# Patient Record
Sex: Male | Born: 1960 | Race: White | Hispanic: No | Marital: Married | Smoking: Never smoker
Health system: Southern US, Community
[De-identification: ages and names within clinical notes are randomized; demographics above are authoritative.]

## PROBLEM LIST (undated history)

## (undated) DIAGNOSIS — M109 Gout, unspecified: Secondary | ICD-10-CM

## (undated) DIAGNOSIS — C801 Malignant (primary) neoplasm, unspecified: Secondary | ICD-10-CM

## (undated) HISTORY — PX: BICEPS TENDON REPAIR: SHX566

## (undated) HISTORY — PX: PROSTATE SURGERY: SHX751

## (undated) HISTORY — DX: Malignant (primary) neoplasm, unspecified: C80.1

---

## 2005-04-04 ENCOUNTER — Ambulatory Visit (HOSPITAL_COMMUNITY): Admission: RE | Admit: 2005-04-04 | Discharge: 2005-04-04 | Payer: Self-pay | Admitting: Urology

## 2006-06-11 ENCOUNTER — Encounter (INDEPENDENT_AMBULATORY_CARE_PROVIDER_SITE_OTHER): Payer: Self-pay | Admitting: Urology

## 2006-06-11 ENCOUNTER — Inpatient Hospital Stay (HOSPITAL_COMMUNITY): Admission: RE | Admit: 2006-06-11 | Discharge: 2006-06-12 | Payer: Self-pay | Admitting: Urology

## 2007-05-26 DIAGNOSIS — Z8546 Personal history of malignant neoplasm of prostate: Secondary | ICD-10-CM | POA: Insufficient documentation

## 2008-08-02 ENCOUNTER — Ambulatory Visit: Payer: Self-pay | Admitting: Gastroenterology

## 2008-08-24 ENCOUNTER — Ambulatory Visit: Payer: Self-pay | Admitting: Gastroenterology

## 2008-08-25 ENCOUNTER — Telehealth (INDEPENDENT_AMBULATORY_CARE_PROVIDER_SITE_OTHER): Payer: Self-pay | Admitting: *Deleted

## 2009-02-20 ENCOUNTER — Telehealth: Payer: Self-pay | Admitting: Gastroenterology

## 2010-02-20 NOTE — Progress Notes (Signed)
Summary: letter request  Phone Note Call from Patient Call back at [phone]   Caller: wife, [name] Call For: Dr. Christella Hartigan Reason for Call: Talk to Nurse Summary of Call: asking for a typed letter from Dr. Christella Hartigan explaining medical necessity for pt's COL... COL has been denied by ins co twice per wife... please call wife with any questions, otherwise asked that she be called when letter is completed Initial call taken by: Vallarie Mare,  February 20, 2009 9:13 AM  Follow-up for Phone Call        Phone Call Completed Sent email to charge correction to add [phone]; Per scheduling notes, pt's PCP recommended colon due to heme+ stools. Will resubmit claim. Wife notified Follow-up by: Dwan Bolt,  February 20, 2009 9:43 AM

## 2010-06-05 NOTE — Op Note (Signed)
NAME:  Adam Cunningham, Adam Cunningham NO.:  [id number]   MEDICAL RECORD NO.:  [id number]          PATIENT TYPE:  INP   LOCATION:  NA                           FACILITY:  [facility]   PHYSICIAN:  Heloise Purpura, MD      DATE OF BIRTH:  1960-08-10   DATE OF PROCEDURE:  06/11/2006  DATE OF DISCHARGE:                               OPERATIVE REPORT   PREOPERATIVE DIAGNOSIS:  Clinically localized prostate cancer.   POSTOPERATIVE DIAGNOSIS:  Clinically localized prostate cancer.   PROCEDURES:  1. Robotic-assisted laparoscopic radical prostatectomy (bilateral      nerve-sparing).  2. Bilateral laparoscopic pelvic lymphadenectomy.   SURGEON:  Crecencio Mc, M.D.   ASSISTANT:  Excell Seltzer. Annabell Howells, M.D.   COMPLICATIONS:  None.   ANESTHESIA:  General.   ESTIMATED BLOOD LOSS:  300 mL.   INTRAVENOUS FLUIDS:  2 L of lactated Ringer's.   SPECIMENS:  1. Prostate and seminal vesicles.  2. Right pelvic lymph nodes.  3. Left pelvic lymph nodes.   DISPOSITION OF SPECIMEN:  To pathology.   DRAINS:  1. 20-French Coude catheter.  2. #19 Blake pelvic drain.   INDICATIONS:  Mr. Devera is a 50 year old gentleman with clinical  stage T1C prostate cancer with a PSA of 4.5 and Gleason score of 3+3+6.  After discussing management options for clinically localized prostate  cancer, he elected to proceed with the above procedure.  Pretreatment  IPSS was 0 with an IIEF score of 25 out of 25.  Potential risks and  benefits were discussed and informed consent was obtained.   DESCRIPTION OF PROCEDURE:  The patient was taken to the operating room  and a general anesthetic was administered.  He was given preoperative  antibiotics, placed in the dorsal lithotomy position, and prepped and  draped in usual sterile fashion.  Next a preoperative time-out was  performed.  A site was selected just to the left of the umbilicus for  placement of the camera port.  A Foley catheter was inserted into the  bladder.  The  camera port site was then incised and carried down through  the subcutaneous tissues with electrocautery.  The rectus fascia was  incised.  The underlying rectus muscle was spread laterally, thereby  exposing the posterior rectus sheath, which was then incised sharply  with a knife allowing entry into the peritoneal cavity under direct  vision.  A 12 mm port was then placed and a pneumoperitoneum was  established.  The 0 degrees lens was then used to inspect the abdomen,  and there was no evidence of any intra-abdominal injuries or other  abnormalities.  The remaining ports were then placed.  Bilateral 8 mm  robotic ports were placed approximately 10 cm lateral to the camera port  and just inferior to the camera port.  An additional 8 mm robotic port  was placed in the far left lateral abdominal wall.  A 5 mm port was  placed between the camera port and the right robotic port.  An  additional 12 mm port was placed in the far right lateral abdominal wall  for laparoscopic assistance.  All ports were placed under direct vision  without difficulty.  The surgical cart was then docked.  With the aid of  the cautery scissors, the bladder was reflected posteriorly allowing  entry into the space of Retzius and identification of the endopelvic  fascia and prostate.  The endopelvic fascia was incised from the apex  back to the base of the prostate bilaterally and the underlying levator  muscle fibers were swept laterally off the prostate.  This isolated the  dorsal venous complex, which was then stapled and divided with a 45 mm  flex ETS stapler.  The bladder neck was identified with the aid of Foley  catheter manipulation and was incised anteriorly, thereby exposing the  Foley catheter.  The catheter balloon was deflated and the catheter was  brought into the operative field and used to retract the prostate  anteriorly.  This exposed the posterior bladder neck, which was  similarly incised, and  dissection continued posteriorly between the  bladder neck and prostate until the vasa deferentia and seminal vesicles  were identified.  The vasa deferentia were isolated and divided and  lifted anteriorly.  The seminal vesicles were then dissected down to  their tips and care was taken to control the seminal vesicle arterial  blood supply.  The seminal vesicles were then also lifted anteriorly and  the space between Denonvilliers fascia and anterior rectum was bluntly  developed.  This isolated the vascular pedicles of the prostate.  The  lateral prostatic fascia was then incised allowing the neurovascular  bundles to be swept laterally and posteriorly off the prostate.  The  vascular pedicles of the prostate were then ligated above the level of  the neurovascular bundles with Hem-o-lok clips.  The vascular pedicles  were then divided with sharp cold scissors dissection.  The  neurovascular bundles were then swept off the apex of the prostate and  the urethra was sharply divided, allowing the prostate specimen to be  disarticulated.  The pelvis was then copiously irrigated and hemostasis  was ensured.  With irrigation in the pelvis, air was injected into the  rectal catheter and there was no evidence of a rectal injury.  Attention  then turned to the right pelvic sidewall.  The fibrofatty tissue between  the external iliac vein, confluence of the iliac vessels, hypogastric  artery, and Cooper's ligament was dissected free from the pelvic  sidewall with care to preserve the obturator nerve.  Hemoloc clips were  used for lymphostasis and hemostasis.  This specimen was then passed off  for permanent pathologic analysis.  An identical procedure was performed  on the contralateral side.  Attention then turned to the urethral  anastomosis.  A 2-0 Vicryl slip-knot was placed at the 6 o'clock  position between the bladder neck and urethra to reapproximate these structures.  A double-armed 3-0  Monocryl suture was then used to perform  a 360 degrees running tension-free anastomosis between the bladder neck  and urethra.  A new 20-French Coude catheter was inserted into the  bladder and irrigated.  There were no blood clots within the bladder and  anastomosis appeared to be watertight.  A #19 Blake drain was then  brought through the left robotic port and appropriately positioned in  the pelvis.  It was secured to the skin with a nylon suture.  A 0 Vicryl  suture was used to close the right lateral 12 mm port site with the  suture passer device.  The prostate specimen  was placed into the  Endopouch retrieval bag and removed intact within this bag via the  periumbilical port site.  All remaining ports were removed under direct  vision.  The periumbilical port site fascia was then closed with a  running 0 Vicryl suture.  All ports were then injected with  0.25% Marcaine and reapproximated at the skin level with staples.  Sterile dressings were applied.  The patient appeared to tolerate the  procedure well and without complications.  He was able to be extubated  and transferred to the recovery unit in satisfactory condition.           ______________________________  Heloise Purpura, MD  Electronically Signed     LB/MEDQ  D:  06/11/2006  T:  06/11/2006  Job:  [id number]

## 2010-06-05 NOTE — H&P (Signed)
NAME:  Adam Cunningham, Adam Cunningham NO.:  [id number]   MEDICAL RECORD NO.:  [id number]          PATIENT TYPE:  INP   LOCATION:  NA                           FACILITY:  [facility]   PHYSICIAN:  Heloise Purpura, MD      DATE OF BIRTH:  Jul 31, 1960   DATE OF ADMISSION:  DATE OF DISCHARGE:                              HISTORY & PHYSICAL   CHIEF COMPLAINT:  Prostate cancer.   HISTORY:  The patient is a 50 year old gentleman who has a history of  clinical stage T1 C prostate cancer with a PSA of 4.1 and Gleason score  of 3 +3 equals 6.  He was initially diagnosed in November2006.  Due to  the fact that he and his wife were trying to conceive a child, he  elected to delay his therapy.  He subsequently had a baby girl and  subsequently has decided to proceed with surgical therapy for his  prostate cancer.  His PSA was closely monitored and did fluctuate  between 3.9 and 4.5.  I counseled him regarding options including  restaging him with a repeat biopsy.  I also offered to the patient  simply proceed with a radical prostatectomy but did recommend a pelvic  lymphadenectomy to accurately stage his cancer.  He chose the latter  approach.   PAST MEDICAL HISTORY:  1. Gout.  2. History of hepatitis A.  3. Prostate cancer.   PAST SURGICAL HISTORY:  Biceps tendon repair.   MEDICATIONS:  None.   ALLERGIES:  NO KNOWN DRUG ALLERGIES.   FAMILY HISTORY:  The patient's father does have prostate cancer.  He  also has a maternal grandfather who died of prostate cancer.   SOCIAL HISTORY:  He works in Doctor, hospital.  He denies alcohol or  tobacco use.   REVIEW OF SYSTEMS:  All systems are reviewed and are negative except for  frequent headaches.   PHYSICAL EXAM:  CONSTITUTIONAL:  Well-nourished, well-developed age-  appropriate male in no acute distress.  CARDIOVASCULAR:  Regular rate and rhythm without obvious murmurs.  LUNGS:  Clear bilaterally.  ABDOMEN:  Soft and nontender.  RECTAL:   Digital rectal exam no nodularity or induration.   IMPRESSION:  Clinically localized prostate cancer.   PLAN:  The patient will undergo robotic assisted laparoscopic radical  prostatectomy and bilateral pelvic lymphadenectomy.  He will then be  admitted to the hospital for routine postoperative care.           ______________________________  Heloise Purpura, MD  Electronically Signed     LB/MEDQ  D:  06/11/2006  T:  06/11/2006  Job:  [id number]

## 2010-06-08 NOTE — Discharge Summary (Signed)
NAME:  Adam Cunningham, Adam Cunningham NO.:  [id number]   MEDICAL RECORD NO.:  [id number]          PATIENT TYPE:  INP   LOCATION:  1437                         FACILITY:  [facility]   PHYSICIAN:  Heloise Purpura, MD      DATE OF BIRTH:  08-21-1960   DATE OF ADMISSION:  06/11/2006  DATE OF DISCHARGE:  06/12/2006                               DISCHARGE SUMMARY   ADMISSION DIAGNOSIS:  Prostate cancer.   DISCHARGE DIAGNOSIS:  Prostate cancer.   HISTORY:  [name] is a 50 year old gentleman with clinically  localized prostate cancer.  For full details of his history and physical  please see his admission history and physical.   HOSPITAL COURSE:  On Jun 11, 2006, the patient was taken to the  operating room and underwent a robotic assisted laparoscopic radical  prostatectomy.  He tolerated this procedure well and without  complications.  Postoperatively, he was able to be transferred to a  regular hospital room following recovery from anesthesia.  He was able  to begin ambulating on the night of surgery.  By the morning of  postoperative day #1, he was able to begin a clear liquid diet which he  tolerated without difficulty and he maintained excellent urine output  with minimal output from his pelvic drain.  His pelvic drain was  therefore removed.  The patient had his diet advanced to liquids which  he tolerated and was therefore able to be transitioned to oral pain  medication.  By the afternoon of postoperative day #1, his hemoglobin  had remained stable and he was able to be discharged home in excellent  condition.   DISPOSITION:  Home.   DISCHARGE MEDICATIONS:  The patient was instructed to resume his regular  home medications excepting any aspirin, nonsteroidal anti-inflammatory  drugs, or herbal supplements.  He was given a prescription to take  Vicodin as needed for pain and told to use Colace as a stool softener.  He was also given a prescription to begin Cipro 1 day prior  to his  return visit for Foley catheter removal.   DISCHARGE INSTRUCTIONS:  The patient was instructed to be ambulatory but  specifically told to refrain from any heavy lifting, strenuous activity,  or driving.  He was instructed on routine Foley catheter care.  He was  also told to gradually advance his diet once passing flatus.   FOLLOW UP:  [name] will follow up in 1 week for removal of his  Foley catheter and to discuss his surgical pathology in detail.           ______________________________  Heloise Purpura, MD  Electronically Signed     LB/MEDQ  D:  06/13/2006  T:  06/13/2006  Job:  [id number]

## 2010-06-25 ENCOUNTER — Other Ambulatory Visit: Payer: Self-pay

## 2010-06-25 MED ORDER — WARFARIN SODIUM 5 MG PO TABS: 5.0000 mg | ORAL_TABLET | Freq: Every day | ORAL | Status: DC

## 2011-11-26 ENCOUNTER — Other Ambulatory Visit: Payer: Self-pay | Admitting: Family Medicine

## 2011-11-26 ENCOUNTER — Encounter: Payer: Self-pay | Admitting: Family Medicine

## 2011-11-26 DIAGNOSIS — R7989 Other specified abnormal findings of blood chemistry: Secondary | ICD-10-CM | POA: Insufficient documentation

## 2011-11-26 DIAGNOSIS — E663 Overweight: Secondary | ICD-10-CM

## 2011-11-26 DIAGNOSIS — E785 Hyperlipidemia, unspecified: Secondary | ICD-10-CM | POA: Insufficient documentation

## 2011-11-26 DIAGNOSIS — M109 Gout, unspecified: Secondary | ICD-10-CM | POA: Insufficient documentation

## 2011-11-26 DIAGNOSIS — R635 Abnormal weight gain: Secondary | ICD-10-CM

## 2011-11-26 DIAGNOSIS — Z8546 Personal history of malignant neoplasm of prostate: Secondary | ICD-10-CM

## 2011-11-28 ENCOUNTER — Other Ambulatory Visit (INDEPENDENT_AMBULATORY_CARE_PROVIDER_SITE_OTHER): Payer: Commercial Managed Care - PPO

## 2011-11-28 DIAGNOSIS — R7989 Other specified abnormal findings of blood chemistry: Secondary | ICD-10-CM

## 2011-11-28 DIAGNOSIS — Z8546 Personal history of malignant neoplasm of prostate: Secondary | ICD-10-CM

## 2011-11-28 DIAGNOSIS — R635 Abnormal weight gain: Secondary | ICD-10-CM

## 2011-11-28 DIAGNOSIS — E291 Testicular hypofunction: Secondary | ICD-10-CM

## 2011-11-28 DIAGNOSIS — E785 Hyperlipidemia, unspecified: Secondary | ICD-10-CM

## 2011-11-28 DIAGNOSIS — M109 Gout, unspecified: Secondary | ICD-10-CM

## 2011-11-28 DIAGNOSIS — E663 Overweight: Secondary | ICD-10-CM

## 2011-11-28 LAB — LIPID PANEL
LDL Cholesterol: 96 mg/dL (ref 0–99)
VLDL: 60 mg/dL — ABNORMAL HIGH (ref 0–40)

## 2011-11-28 LAB — CBC
HCT: 46.3 % (ref 39.0–52.0)
Hemoglobin: 16.6 g/dL (ref 13.0–17.0)
MCHC: 35.9 g/dL (ref 30.0–36.0)
MCV: 83.7 fL (ref 78.0–100.0)
RBC: 5.53 MIL/uL (ref 4.22–5.81)
RDW: 12.9 % (ref 11.5–15.5)
WBC: 11.7 10*3/uL — ABNORMAL HIGH (ref 4.0–10.5)

## 2011-11-28 LAB — COMPREHENSIVE METABOLIC PANEL
Alkaline Phosphatase: 57 U/L (ref 39–117)
CO2: 29 mEq/L (ref 19–32)
Creat: 1.07 mg/dL (ref 0.50–1.35)
Glucose, Bld: 79 mg/dL (ref 70–99)
Potassium: 4.2 mEq/L (ref 3.5–5.3)
Total Protein: 7.1 g/dL (ref 6.0–8.3)

## 2011-11-28 LAB — URIC ACID: Uric Acid, Serum: 9.6 mg/dL — ABNORMAL HIGH (ref 4.0–7.8)

## 2011-11-29 LAB — PSA: PSA: <0.01 ng/mL (ref <=4.00)

## 2011-12-09 ENCOUNTER — Ambulatory Visit (INDEPENDENT_AMBULATORY_CARE_PROVIDER_SITE_OTHER): Payer: Commercial Managed Care - PPO | Admitting: Family Medicine

## 2011-12-09 ENCOUNTER — Encounter: Payer: Self-pay | Admitting: Family Medicine

## 2011-12-09 VITALS — BP 130/82 | HR 74 | Temp 98.2°F | Resp 16 | Ht 71.0 in | Wt 253.6 lb

## 2011-12-09 DIAGNOSIS — M109 Gout, unspecified: Secondary | ICD-10-CM

## 2011-12-09 DIAGNOSIS — Z23 Encounter for immunization: Secondary | ICD-10-CM

## 2011-12-09 DIAGNOSIS — C61 Malignant neoplasm of prostate: Secondary | ICD-10-CM

## 2011-12-09 DIAGNOSIS — E236 Other disorders of pituitary gland: Secondary | ICD-10-CM

## 2011-12-09 DIAGNOSIS — E782 Mixed hyperlipidemia: Secondary | ICD-10-CM

## 2011-12-09 DIAGNOSIS — E785 Hyperlipidemia, unspecified: Secondary | ICD-10-CM

## 2011-12-09 DIAGNOSIS — Z0000 Encounter for general adult medical examination without abnormal findings: Secondary | ICD-10-CM

## 2011-12-09 DIAGNOSIS — Z8546 Personal history of malignant neoplasm of prostate: Secondary | ICD-10-CM

## 2011-12-09 LAB — POCT UA - MICROSCOPIC ONLY
Bacteria, U Microscopic: NEGATIVE
Crystals, Ur, HPF, POC: NEGATIVE
Yeast, UA: NEGATIVE

## 2011-12-09 LAB — POCT URINALYSIS DIPSTICK
Protein, UA: NEGATIVE
Urobilinogen, UA: 0.2
pH, UA: 5

## 2011-12-09 NOTE — Progress Notes (Signed)
Subjective:    Patient ID: Adam Cunningham, male    DOB: Jul 11, 1960, 51 y.o.   MRN: [id number]  HPI Adam Cunningham is a 51 y.o. male  Here for CPE. New patient to me.  See recent labs. Last Tdap 2012.   Colonoscopy few years ago - repeat in 10 years. - was normal. Flu shot today.  Hx of prostate CA with radical prostatectomy in 2009 - Dr. Laverle Patter.  Now followed by Dr. Ronal Fear. Last PSA less than 0.01. No urinary difficulties, no hematuria.   Hx Gout: uric acid 9.6 on labs recently.  last flair over 1 year ago. Has allopurinol rx, but not taken in over a year.  Has colcrys if needed.   Hyperlipidemia/Hypertriglyceridemia - cooks at home.  Uses butter often.  No regular exercise. Occasional olive oil use. May be losing a little weight.   Hypogonadism/low testosterone - not on replacement in past due to prostate history and children at [facility] had some skin irritation.    Results for orders placed in visit on 11/28/11  COMPREHENSIVE METABOLIC PANEL      Component Value Range   Sodium 140  135 - 145 mEq/L   Potassium 4.2  3.5 - 5.3 mEq/L   Chloride 101  96 - 112 mEq/L   CO2 29  19 - 32 mEq/L   Glucose, Bld 79  70 - 99 mg/dL   BUN 16  6 - 23 mg/dL   Creat 4.09  8.11 - 9.14 mg/dL   Total Bilirubin 1.1  0.3 - 1.2 mg/dL   Alkaline Phosphatase 57  39 - 117 U/L   AST 22  0 - 37 U/L   ALT 35  0 - 53 U/L   Total Protein 7.1  6.0 - 8.3 g/dL   Albumin 4.5  3.5 - 5.2 g/dL   Calcium 9.0  8.4 - 78.2 mg/dL  LIPID PANEL      Component Value Range   Cholesterol 192  0 - 200 mg/dL   Triglycerides 956 (*) <150 mg/dL   HDL 36 (*) >21 mg/dL   Total CHOL/HDL Ratio 5.3     VLDL 60 (*) 0 - 40 mg/dL   LDL Cholesterol 96  0 - 99 mg/dL  PSA      Component Value Range   PSA <0.01  <=4.00 ng/mL  CBC      Component Value Range   WBC 11.7 (*) 4.0 - 10.5 K/uL   RBC 5.53  4.22 - 5.81 MIL/uL   Hemoglobin 16.6  13.0 - 17.0 g/dL   HCT 30.8  65.7 - 84.6 %   MCV 83.7  78.0 - 100.0 fL   MCH  30.0  26.0 - 34.0 pg   MCHC 35.9  30.0 - 36.0 g/dL   RDW 96.2  95.2 - 84.1 %   Platelets 178  150 - 400 K/uL  TSH      Component Value Range   TSH 0.608  0.350 - 4.500 uIU/mL  TESTOSTERONE      Component Value Range   Testosterone 211.60 (*) 300 - 890 ng/dL  URIC ACID      Component Value Range   Uric Acid, Serum 9.6 (*) 4.0 - 7.8 mg/dL   Past Medical History  Diagnosis Date  . Cancer    Past Surgical History  Procedure Date  . Prostate surgery   . Biceps tendon repair     History   Social History  . Marital Status: Married  Spouse Name: N/A    Number of Children: N/A  . Years of Education: N/A   Occupational History  . Not on file.   Social History Main Topics  . Smoking status: Never Smoker   . Smokeless tobacco: Not on file  . Alcohol Use: No  . Drug Use: No  . Sexually Active: Not on file   Other Topics Concern  . Not on file   Social History Narrative  . No narrative on file      Review of Systems  All other systems reviewed and are negative.   See cma note - no positive responses    Objective:   Physical Exam  Constitutional: He is oriented to person, place, and time. He appears well-developed and well-nourished.  HENT:  Head: Normocephalic and atraumatic.  Right Ear: External ear normal.  Left Ear: External ear normal.  Mouth/Throat: Oropharynx is clear and moist.  Eyes: Conjunctivae normal and EOM are normal. Pupils are equal, round, and reactive to light.  Neck: Normal range of motion. Neck supple. Carotid bruit is not present. No thyromegaly present.  Cardiovascular: Normal rate, regular rhythm, normal heart sounds and intact distal pulses.   Pulmonary/Chest: Effort normal and breath sounds normal. No respiratory distress. He has no wheezes.  Abdominal: Soft. He exhibits no distension. There is no tenderness. Hernia confirmed negative in the right inguinal area and confirmed negative in the left inguinal area.  Genitourinary:       No  mass or palpable prostate.   Musculoskeletal: Normal range of motion. He exhibits no edema and no tenderness.  Lymphadenopathy:    He has no cervical adenopathy.  Neurological: He is alert and oriented to person, place, and time. He has normal reflexes.  Skin: Skin is warm and dry.  Psychiatric: He has a normal mood and affect. His behavior is normal.   Results for orders placed in visit on 12/09/11  IFOBT (OCCULT BLOOD)      Component Value Range   IFOBT Negative    POCT UA - MICROSCOPIC ONLY      Component Value Range   WBC, Ur, HPF, POC 0-1     RBC, urine, microscopic neg     Bacteria, U Microscopic neg     Mucus, UA small     Epithelial cells, urine per micros 0-2     Crystals, Ur, HPF, POC neg     Casts, Ur, LPF, POC neg     Yeast, UA neg    POCT URINALYSIS DIPSTICK      Component Value Range   Color, UA yellow     Clarity, UA clear     Glucose, UA neg     Bilirubin, UA neg     Ketones, UA neg     Spec Grav, UA 1.025     Blood, UA neg     pH, UA 5.0     Protein, UA neg     Urobilinogen, UA 0.2     Nitrite, UA neg     Leukocytes, UA Negative     BP 130/82  Pulse 74  Temp 98.2 F (36.8 C) (Oral)  Resp 16  Ht 5\' 11"  (1.803 m)  Wt 253 lb 9.6 oz (115.032 kg)  BMI 35.37 kg/m2  SpO2 98%     Assessment & Plan:  Adam Cunningham is a 51 y.o. male 1. Routine general medical examination at a health care facility  IFOBT POC (occult bld, rslt in office), POCT UA -  Microscopic Only, POCT urinalysis dipstick  2. Need for prophylactic vaccination and inoculation against influenza  Flu vaccine greater than or equal to 3yo preservative free IM  3. Hyperlipidemia    4. Gout    5. H/O prostate cancer     CPE - see prior labs.  Discussed lifestyle changes for hyperlipidemia. Flu vaccine given,  utd on td, hemosure negative, and up to date on colonoscopy. Anticipatory guidance as below.   Hyperlipidemia - plan working on diet and exercise changes, recheck lipids in 3-6  months. Consider fenofibrate if hypertriglyceridemia persists.  Gout - elevated uric acid, but no recent flair.  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  Watch diet, and if flair - has colcrys.  Hx of Prostate CA - u/a and recent PSA, exam reassuring.  Plan on yearly psa/DRE as recommended by urology.    Patient Instructions  Work on diet changes and increasing activity and exercise.  Recheck cholesterol in 3 to 6 months.  If triglycerides still elevated- may need to consider medication then. We can discuss the low testosterone at the next office visit.   Keeping you healthy  Get these tests  Blood pressure- Have your blood pressure checked once a year by your healthcare provider.  Normal blood pressure is 120/80  Weight- Have your body mass index (BMI) calculated to screen for obesity.  BMI is a measure of body fat based on height and weight. You can also calculate your own BMI at [email or web address].  Cholesterol- Have your cholesterol checked every year.  Diabetes- Have your blood sugar checked regularly if you have high blood pressure, high cholesterol, have a family history of diabetes or if you are overweight.  Screening for Colon Cancer- Colonoscopy starting at age 43.  Screening may begin sooner depending on your family history and other health conditions. Follow up colonoscopy as directed by your Gastroenterologist.  Screening for Prostate Cancer- Both blood work (PSA) and a rectal exam help screen for Prostate Cancer.  Screening begins at age 60 with African-American men and at age 45 with Caucasian men.  Screening may begin sooner depending on your family history.  Take these medicines  Aspirin- One aspirin daily can help prevent Heart disease and Stroke.  Flu shot- Every fall.  Tetanus- Every 10 years.  Zostavax- Once after the age of 73 to prevent Shingles.  Pneumonia  shot- Once after the age of 51; if you are younger than 39, ask your healthcare provider if you need a Pneumonia shot.  Take these steps  Don't smoke- If you do smoke, talk to your doctor about quitting.  For tips on how to quit, go to www.smokefree.gov or call 1-800-QUIT-NOW.  Be physically active- Exercise 5 days a week for at least 30 minutes.  If you are not already physically active start slow and gradually work up to 30 minutes of moderate physical activity.  Examples of moderate activity include walking briskly, mowing the yard, dancing, swimming, bicycling, etc.  Eat a healthy diet- Eat a variety of healthy food such as fruits, vegetables, low fat milk, low fat cheese, yogurt, lean meant, poultry, fish, beans, tofu, etc. For more information go to www.thenutritionsource.org  Drink alcohol in moderation- Limit alcohol intake to less than two drinks a day. Never drink and drive.  Dentist- Brush and floss twice daily; visit your dentist twice a year.  Depression- Your emotional health is as  important as your physical health. If you're feeling down, or losing interest in things you would normally enjoy please talk to your healthcare provider.  Eye exam- Visit your eye doctor every year.  Safe sex- If you may be exposed to a sexually transmitted infection, use a condom.  Seat belts- Seat belts can save your life; always wear one.  Smoke/Carbon Monoxide detectors- These detectors need to be installed on the appropriate level of your home.  Replace batteries at least once a year.  Skin cancer- When out in the sun, cover up and use sunscreen 15 SPF or higher.  Violence- If anyone is threatening you, please tell your healthcare provider.  Living Will/ Health care power of attorney- Speak with your healthcare provider and family.

## 2011-12-09 NOTE — Patient Instructions (Signed)
Work on diet changes and increasing activity and exercise.  Recheck cholesterol in 3 to 6 months.  If triglycerides still elevated- may need to consider medication then. We can discuss the low testosterone at the next office visit.   Keeping you healthy  Get these tests  Blood pressure- Have your blood pressure checked once a year by your healthcare provider.  Normal blood pressure is 120/80  Weight- Have your body mass index (BMI) calculated to screen for obesity.  BMI is a measure of body fat based on height and weight. You can also calculate your own BMI at [email or web address].  Cholesterol- Have your cholesterol checked every year.  Diabetes- Have your blood sugar checked regularly if you have high blood pressure, high cholesterol, have a family history of diabetes or if you are overweight.  Screening for Colon Cancer- Colonoscopy starting at age 58.  Screening may begin sooner depending on your family history and other health conditions. Follow up colonoscopy as directed by your Gastroenterologist.  Screening for Prostate Cancer- Both blood work (PSA) and a rectal exam help screen for Prostate Cancer.  Screening begins at age 76 with African-American men and at age 60 with Caucasian men.  Screening may begin sooner depending on your family history.  Take these medicines  Aspirin- One aspirin daily can help prevent Heart disease and Stroke.  Flu shot- Every fall.  Tetanus- Every 10 years.  Zostavax- Once after the age of 13 to prevent Shingles.  Pneumonia shot- Once after the age of 51; if you are younger than 73, ask your healthcare provider if you need a Pneumonia shot.  Take these steps  Don't smoke- If you do smoke, talk to your doctor about quitting.  For tips on how to quit, go to www.smokefree.gov or call 1-800-QUIT-NOW.  Be physically active- Exercise 5 days a week for at least 30 minutes.  If you are not already physically active start slow and gradually work up  to 30 minutes of moderate physical activity.  Examples of moderate activity include walking briskly, mowing the yard, dancing, swimming, bicycling, etc.  Eat a healthy diet- Eat a variety of healthy food such as fruits, vegetables, low fat milk, low fat cheese, yogurt, lean meant, poultry, fish, beans, tofu, etc. For more information go to www.thenutritionsource.org  Drink alcohol in moderation- Limit alcohol intake to less than two drinks a day. Never drink and drive.  Dentist- Brush and floss twice daily; visit your dentist twice a year.  Depression- Your emotional health is as important as your physical health. If you're feeling down, or losing interest in things you would normally enjoy please talk to your healthcare provider.  Eye exam- Visit your eye doctor every year.  Safe sex- If you may be exposed to a sexually transmitted infection, use a condom.  Seat belts- Seat belts can save your life; always wear one.  Smoke/Carbon Monoxide detectors- These detectors need to be installed on the appropriate level of your home.  Replace batteries at least once a year.  Skin cancer- When out in the sun, cover up and use sunscreen 15 SPF or higher.  Violence- If anyone is threatening you, please tell your healthcare provider.  Living Will/ Health care power of attorney- Speak with your healthcare provider and family.

## 2011-12-16 ENCOUNTER — Telehealth: Payer: Self-pay | Admitting: *Deleted

## 2011-12-16 MED ORDER — INDOMETHACIN 50 MG PO CAPS: 50.0000 mg | ORAL_CAPSULE | Freq: Two times a day (BID) | ORAL | Status: DC

## 2011-12-16 MED ORDER — COLCHICINE 0.6 MG PO TABS: 0.6000 mg | ORAL_TABLET | Freq: Every day | ORAL | Status: DC

## 2011-12-16 NOTE — Telephone Encounter (Signed)
Rx done. 

## 2011-12-16 NOTE — Telephone Encounter (Signed)
Needs a refill on colchicine and indocin. Pt is having a gout flare.

## 2012-12-08 ENCOUNTER — Encounter: Payer: Self-pay | Admitting: Emergency Medicine

## 2012-12-08 ENCOUNTER — Ambulatory Visit (INDEPENDENT_AMBULATORY_CARE_PROVIDER_SITE_OTHER): Payer: 59 | Admitting: Emergency Medicine

## 2012-12-08 VITALS — BP 120/82 | HR 72 | Temp 98.0°F | Resp 16 | Ht 71.0 in | Wt 229.8 lb

## 2012-12-08 DIAGNOSIS — Z8546 Personal history of malignant neoplasm of prostate: Secondary | ICD-10-CM

## 2012-12-08 DIAGNOSIS — Z23 Encounter for immunization: Secondary | ICD-10-CM

## 2012-12-08 DIAGNOSIS — Z0000 Encounter for general adult medical examination without abnormal findings: Secondary | ICD-10-CM

## 2012-12-08 DIAGNOSIS — M109 Gout, unspecified: Secondary | ICD-10-CM

## 2012-12-08 LAB — LIPID PANEL
Cholesterol: 197 mg/dL (ref 0–200)
HDL: 46 mg/dL (ref >39)
LDL Cholesterol: 115 mg/dL — ABNORMAL HIGH (ref 0–99)
Total CHOL/HDL Ratio: 4.3 Ratio
Triglycerides: 178 mg/dL — ABNORMAL HIGH (ref <150)

## 2012-12-08 LAB — CBC WITH DIFFERENTIAL/PLATELET
Basophils Absolute: 0 10*3/uL (ref 0.0–0.1)
Basophils Relative: 0 % (ref 0–1)
Eosinophils Absolute: 0.1 10*3/uL (ref 0.0–0.7)
Hemoglobin: 15.9 g/dL (ref 13.0–17.0)
Lymphs Abs: 2.4 10*3/uL (ref 0.7–4.0)
MCH: 29.8 pg (ref 26.0–34.0)
MCHC: 35.8 g/dL (ref 30.0–36.0)
MCV: 83.3 fL (ref 78.0–100.0)
Neutro Abs: 4.7 10*3/uL (ref 1.7–7.7)
Platelets: 232 10*3/uL (ref 150–400)
RBC: 5.33 MIL/uL (ref 4.22–5.81)
RDW: 13.6 % (ref 11.5–15.5)
WBC: 7.6 10*3/uL (ref 4.0–10.5)

## 2012-12-08 LAB — COMPREHENSIVE METABOLIC PANEL
BUN: 10 mg/dL (ref 6–23)
Chloride: 103 mEq/L (ref 96–112)
Creat: 0.89 mg/dL (ref 0.50–1.35)
Potassium: 4.1 mEq/L (ref 3.5–5.3)
Sodium: 141 mEq/L (ref 135–145)

## 2012-12-08 LAB — POCT URINALYSIS DIPSTICK
Glucose, UA: NEGATIVE
Leukocytes, UA: NEGATIVE
Nitrite, UA: NEGATIVE

## 2012-12-08 LAB — URIC ACID: Uric Acid, Serum: 8.4 mg/dL — ABNORMAL HIGH (ref 4.0–7.8)

## 2012-12-08 MED ORDER — ZOSTER VACCINE LIVE 19400 UNT/0.65ML ~~LOC~~ SOLR: 0.6500 mL | Freq: Once | SUBCUTANEOUS | Status: DC

## 2012-12-08 NOTE — Progress Notes (Signed)
@[email or web address] @  Patient ID: Adam Cunningham MRN: [id number], DOB: 01-Jan-1961 52 y.o. Date of Encounter: 12/08/2012, 4:01 PM  Primary Physician: No primary provider on file.  Chief Complaint: Physical (CPE)  HPI: 52 y.o. y/o male with history noted below here for CPE.  Doing well. No issues/complaints.  Review of Systems: Consitutional: No fever, chills, fatigue, night sweats, lymphadenopathy, or weight changes. He has been exercising regularly Eyes: No visual changes, eye redness, or discharge. ENT/Mouth: Ears: No otalgia, tinnitus, hearing loss, discharge. Nose: No congestion, rhinorrhea, sinus pain, or epistaxis. Throat: No sore throat, post nasal drip, or teeth pain. Cardiovascular: No CP, palpitations, diaphoresis, DOE, edema, orthopnea, PND. Respiratory: No cough, hemoptysis, SOB, or wheezing. Gastrointestinal: No anorexia, dysphagia, reflux, pain, nausea, vomiting, hematemesis, diarrhea, constipation, BRBPR, or melena. He is up-to-date on his colonoscopy his last being in 2000 and Genitourinary: No dysuria, frequency, urgency, hematuria, incontinence, nocturia, decreased urinary stream, discharge, impotence, or testicular pain/masses. He has a history of prostate cancer status post surgery intermittently has some discomfort in his left testicle Musculoskeletal: No decreased ROM, myalgias, stiffness, joint swelling, or weakness. He has a history of gout but no recent problems with joint discomfort. Is currently off his allopurinol to Skin: No rash, erythema, lesion changes, pain, warmth, jaundice, or pruritis. Neurological: No headache, dizziness, syncope, seizures, tremors, memory loss, coordination problems, or paresthesias. Psychological: No anxiety, depression, hallucinations, SI/HI. Endocrine: No fatigue, polydipsia, polyphagia, polyuria, or known diabetes. All other systems were reviewed and are otherwise negative.  Past Medical History  Diagnosis Date  . Cancer     prostate      Past Surgical History  Procedure Laterality Date  . Prostate surgery    . Biceps tendon repair      Home Meds:  Prior to Admission medications   Medication Sig Start Date End Date Taking? Authorizing Provider  colchicine 0.6 MG tablet Take 1 tablet (0.6 mg total) by mouth daily. 12/16/11  Yes Heather M Marte, PA-C  indomethacin (INDOCIN) 50 MG capsule Take 1 capsule (50 mg total) by mouth 2 (two) times daily with a meal. 12/16/11  Yes Nelva Nay, PA-C  zoster vaccine live, PF, (ZOSTAVAX) 29518 UNT/0.65ML injection Inject 19,400 Units into the skin once. 12/08/12   Collene Gobble, MD    Allergies: No Known Allergies  History   Social History  . Marital Status: Married    Spouse Name: N/A    Number of Children: N/A  . Years of Education: N/A   Occupational History  . Not on file.   Social History Main Topics  . Smoking status: Never Smoker   . Smokeless tobacco: Not on file  . Alcohol Use: No  . Drug Use: No  . Sexual Activity: Yes   Other Topics Concern  . 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 Cancer Mother     lung    Physical Exam:  Blood pressure 120/82, pulse 72, temperature 98 F (36.7 C), temperature source Oral, resp. rate 16, height 5\' 11"  (1.803 m), weight 229 lb 12.8 oz (104.237 kg), SpO2 98.00%.  General: Well developed, well nourished, in no acute distress. HEENT: Normocephalic, atraumatic. Conjunctiva pink, sclera non-icteric. Pupils 2 mm constricting to 1 mm, round, regular, and equally reactive to light and accomodation. EOMI. Internal auditory canal clear. TMs with good cone of light and without pathology. Nasal  mucosa pink. Nares are without discharge. No sinus tenderness. Oral mucosa pink. Dentition . Pharynx without exudate.   Neck: Supple. Trachea midline. No thyromegaly. Full ROM. No lymphadenopathy. Lungs: Clear to auscultation  bilaterally without wheezes, rales, or rhonchi. Breathing is of normal effort and unlabored. Cardiovascular: RRR with S1 S2. No murmurs, rubs, or gallops appreciated. Distal pulses 2+ symmetrically. No carotid or abdominal bruits.  Abdomen: Soft, non-tender, non-distended with normoactive bowel sounds. No hepatosplenomegaly or masses. No rebound/guarding. No CVA tenderness. Without hernias.  Rectal: No external hemorrhoids or fissures. Rectal vault without masses. The prostate gland is surgically absent Genitourinary:   circumcised male. No penile lesions. Testes descended bilaterally, and smooth without tenderness or masses.  Musculoskeletal: Full range of motion and 5/5 strength throughout. Without swelling, atrophy, tenderness, crepitus, or warmth. Extremities without clubbing, cyanosis, or edema. Calves supple. Skin: Warm and moist without erythema, ecchymosis, wounds, or rash. Neuro: A+Ox3. CN II-XII grossly intact. Moves all extremities spontaneously. Full sensation throughout. Normal gait. DTR 2+ throughout upper and lower extremities. Finger to nose intact. Psych:  Responds to questions appropriately with a normal affect.   Results for orders placed in visit on 12/08/12  POCT URINALYSIS DIPSTICK      Result Value Range   Color, UA yellow     Clarity, UA clear     Glucose, UA neg     Bilirubin, UA neg     Ketones, UA neg     Spec Grav, UA >=1.030     Blood, UA neg     pH, UA 6.5     Protein, UA neg     Urobilinogen, UA 0.2     Nitrite, UA neg     Leukocytes, UA Negative    IFOBT (OCCULT BLOOD)      Result Value Range   IFOBT Negative    EKG left axis deviation otherwise normal. Studies: CBC, CMET, Lipid, PSA,    Assessment/Plan:  52 y.o. y/o male here for his physical exam. He has a history of prostate cancer treated surgically. He has a history of hypertriglyceridemia as well as gout. He has been working on weight loss diet and exercise. He's been careful about his diet to  we'll see what his labs show. He had been on allopurinol ankle to scene and Indocin in the past for his gout but has had no recent flareups. He has a history of low testosterone but with his history of prostate cancer he is not a candidate for replacement anyway so I did not do any levels. -  Signed, Earl Lites, MD 12/08/2012 4:01 PM

## 2012-12-09 LAB — TSH: TSH: 1.238 u[IU]/mL (ref 0.350–4.500)

## 2012-12-09 LAB — PSA: PSA: <0.01 ng/mL (ref <=4.00)

## 2013-09-14 ENCOUNTER — Ambulatory Visit (INDEPENDENT_AMBULATORY_CARE_PROVIDER_SITE_OTHER): Payer: 59 | Admitting: Emergency Medicine

## 2013-09-14 ENCOUNTER — Encounter: Payer: Self-pay | Admitting: Emergency Medicine

## 2013-09-14 ENCOUNTER — Ambulatory Visit (INDEPENDENT_AMBULATORY_CARE_PROVIDER_SITE_OTHER): Payer: 59

## 2013-09-14 VITALS — BP 126/84 | HR 68 | Temp 97.8°F | Resp 16 | Ht 72.0 in | Wt 233.0 lb

## 2013-09-14 DIAGNOSIS — M899 Disorder of bone, unspecified: Secondary | ICD-10-CM

## 2013-09-14 DIAGNOSIS — M898X8 Other specified disorders of bone, other site: Secondary | ICD-10-CM

## 2013-09-14 DIAGNOSIS — M949 Disorder of cartilage, unspecified: Secondary | ICD-10-CM

## 2013-09-14 NOTE — Progress Notes (Signed)
   Subjective:  This chart was scribed for Adam Queen, MD by Adam Cunningham, Medical Scribe. This patient was seen in Room 21 and the patient's care was started at 9:29 AM.   Patient ID: Adam Cunningham, male    DOB: [birth year], 53 y.o.   MRN: [id number]  HPI HPI Comments: Adam Cunningham is a 53 y.o. male with a history of prostate cancer who presents to the [facility] complaining of a mass on his sternum he noticed a month ago.  He is not in pain.    Past Medical History  Diagnosis Date  . Cancer     prostate   Past Surgical History  Procedure Laterality Date  . Prostate surgery    . Biceps tendon repair     Family History  Problem Relation Age of Onset  . Cancer Mother     lung   History   Social History  . Marital Status: Married    Spouse Name: N/A    Number of Children: N/A  . Years of Education: N/A   Occupational History  . Not on file.   Social History Main Topics  . Smoking status: Never Smoker   . Smokeless tobacco: Not on file  . Alcohol Use: No  . Drug Use: No  . Sexual Activity: Yes   Other Topics Concern  . 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 Constitutional: He is oriented to person, place, and time. He appears well-developed and well-nourished.  HENT:  Head: Normocephalic and atraumatic.  Eyes: EOM are normal.  Neck: Normal range of motion.  Cardiovascular: Normal rate, regular rhythm and normal heart sounds.  Exam reveals no gallop and no friction rub.   No murmur heard. Pulmonary/Chest: Effort normal and breath sounds normal. No respiratory distress. He has no wheezes. He has no rales.  Very prominent xyphoid process which is non-tender.    Abdominal: Soft. Bowel sounds are normal. There is no tenderness.  Musculoskeletal: Normal range of motion.    Neurological: He is alert and oriented to person, place, and time.  Skin: Skin is warm and dry.  Psychiatric: He has a normal mood and affect. His behavior is normal.   [facility] reading (PRIMARY) by  Dr. Everlene Farrier patient has a prominent xiphoid process chest and sternal views otherwise normal    BP 126/84  Pulse 68  Temp(Src) 97.8 F (36.6 C)  Resp 16  Ht 6' (1.829 m)  Wt 233 lb (105.688 kg)  BMI 31.59 kg/m2 Assessment & Plan:  Patient reassured about the findings on his x-rays. Continue his regular scheduled physicals   I personally performed the services described in this documentation, which was scribed in my presence. The recorded information has been reviewed and is accurate.

## 2013-12-14 ENCOUNTER — Encounter: Payer: Self-pay | Admitting: Emergency Medicine

## 2013-12-14 ENCOUNTER — Ambulatory Visit (INDEPENDENT_AMBULATORY_CARE_PROVIDER_SITE_OTHER): Payer: 59 | Admitting: Emergency Medicine

## 2013-12-14 VITALS — BP 129/84 | HR 73 | Temp 97.0°F | Resp 16 | Ht 72.0 in | Wt 231.0 lb

## 2013-12-14 DIAGNOSIS — Z1211 Encounter for screening for malignant neoplasm of colon: Secondary | ICD-10-CM

## 2013-12-14 DIAGNOSIS — E785 Hyperlipidemia, unspecified: Secondary | ICD-10-CM

## 2013-12-14 DIAGNOSIS — Z0000 Encounter for general adult medical examination without abnormal findings: Secondary | ICD-10-CM

## 2013-12-14 DIAGNOSIS — Z8546 Personal history of malignant neoplasm of prostate: Secondary | ICD-10-CM

## 2013-12-14 DIAGNOSIS — H01003 Unspecified blepharitis right eye, unspecified eyelid: Secondary | ICD-10-CM

## 2013-12-14 DIAGNOSIS — H01006 Unspecified blepharitis left eye, unspecified eyelid: Secondary | ICD-10-CM

## 2013-12-14 DIAGNOSIS — M1A4720 Other secondary chronic gout, left ankle and foot, without tophus (tophi): Secondary | ICD-10-CM

## 2013-12-14 DIAGNOSIS — Z23 Encounter for immunization: Secondary | ICD-10-CM

## 2013-12-14 LAB — CBC WITH DIFFERENTIAL/PLATELET
BASOS ABS: 0.1 10*3/uL (ref 0.0–0.1)
BASOS PCT: 1 % (ref 0–1)
EOS PCT: 6 % — AB (ref 0–5)
Eosinophils Absolute: 0.4 10*3/uL (ref 0.0–0.7)
HEMATOCRIT: 45.3 % (ref 39.0–52.0)
HEMOGLOBIN: 16.1 g/dL (ref 13.0–17.0)
LYMPHS PCT: 39 % (ref 12–46)
Lymphs Abs: 2.3 10*3/uL (ref 0.7–4.0)
MCH: 29.5 pg (ref 26.0–34.0)
MCHC: 35.5 g/dL (ref 30.0–36.0)
MCV: 83.1 fL (ref 78.0–100.0)
MONO ABS: 0.3 10*3/uL (ref 0.1–1.0)
MONOS PCT: 5 % (ref 3–12)
MPV: 9 fL — AB (ref 9.4–12.4)
NEUTROS ABS: 2.9 10*3/uL (ref 1.7–7.7)
NEUTROS PCT: 49 % (ref 43–77)
PLATELETS: 170 10*3/uL (ref 150–400)
RBC: 5.45 MIL/uL (ref 4.22–5.81)
RDW: 13.4 % (ref 11.5–15.5)
WBC: 5.9 10*3/uL (ref 4.0–10.5)

## 2013-12-14 LAB — POCT URINALYSIS DIPSTICK
Bilirubin, UA: NEGATIVE
Glucose, UA: NEGATIVE
Ketones, UA: NEGATIVE
Leukocytes, UA: NEGATIVE
Nitrite, UA: NEGATIVE
PH UA: 6
Protein, UA: NEGATIVE
RBC UA: NEGATIVE
SPEC GRAV UA: 1.015
UROBILINOGEN UA: 0.2

## 2013-12-14 LAB — LIPID PANEL
CHOLESTEROL: 185 mg/dL (ref 0–200)
HDL: 39 mg/dL — ABNORMAL LOW (ref >39)
LDL Cholesterol: 104 mg/dL — ABNORMAL HIGH (ref 0–99)
TRIGLYCERIDES: 212 mg/dL — AB (ref <150)
Total CHOL/HDL Ratio: 4.7 Ratio
VLDL: 42 mg/dL — ABNORMAL HIGH (ref 0–40)

## 2013-12-14 LAB — COMPLETE METABOLIC PANEL WITH GFR
ALT: 17 U/L (ref 0–53)
AST: 16 U/L (ref 0–37)
Albumin: 4.5 g/dL (ref 3.5–5.2)
Alkaline Phosphatase: 56 U/L (ref 39–117)
BUN: 12 mg/dL (ref 6–23)
CALCIUM: 9.1 mg/dL (ref 8.4–10.5)
CHLORIDE: 105 meq/L (ref 96–112)
CO2: 24 meq/L (ref 19–32)
Creat: 0.95 mg/dL (ref 0.50–1.35)
GFR, Est Non African American: >89 mL/min
GLUCOSE: 99 mg/dL (ref 70–99)
Potassium: 3.9 mEq/L (ref 3.5–5.3)
Sodium: 140 mEq/L (ref 135–145)
Total Bilirubin: 0.7 mg/dL (ref 0.2–1.2)
Total Protein: 7.2 g/dL (ref 6.0–8.3)

## 2013-12-14 LAB — URIC ACID: URIC ACID, SERUM: 8.3 mg/dL — AB (ref 4.0–7.8)

## 2013-12-14 MED ORDER — ERYTHROMYCIN 5 MG/GM OP OINT: 1.0000 "application " | TOPICAL_OINTMENT | Freq: Every day | OPHTHALMIC | Status: DC

## 2013-12-14 NOTE — Progress Notes (Addendum)
Subjective:  This chart was scribed for [name], MD by Erling Conte, Medical Scribe. This patient was seen in Room 23 and the patient's care was started at 9:38 AM.   Patient ID: Adam Cunningham, male    DOB: Nov 30, 1960, 53 y.o.   MRN: [id number]  Chief Complaint  Patient presents with  . Annual Exam   HPI HPI Comments: Adam Cunningham is a 53 y.o. male who presents to the [facility] for his complete annual exam. Pt states he is doing well with no complaints. His tetanus is UTD. Pt previously used smokeless tobacco but has not used it in several years.  He is UTD on his colonoscopy. Pt would like to get a flu shot.  Patient Active Problem List   Diagnosis Date Noted  . Hyperlipidemia 11/26/2011  . Gout 11/26/2011  . Low testosterone 11/26/2011  . H/O prostate cancer 05/26/2007   Past Medical History  Diagnosis Date  . Cancer     prostate   Past Surgical History  Procedure Laterality Date  . Prostate surgery    . Biceps tendon repair     No Known Allergies Prior to Admission medications   Medication Sig Start Date End Date Taking? Authorizing Provider  colchicine 0.6 MG tablet Take 1 tablet (0.6 mg total) by mouth daily. 12/16/11   Heather Elnora Morrison, PA-C  indomethacin (INDOCIN) 50 MG capsule Take 1 capsule (50 mg total) by mouth 2 (two) times daily with a meal. 12/16/11   Collene Leyden, PA-C  zoster vaccine live, PF, (ZOSTAVAX) 91478 UNT/0.65ML injection Inject 19,400 Units into the skin once. 12/08/12   Darlyne Russian, MD   History   Social History  . Marital Status: Married    Spouse Name: N/A    Number of Children: N/A  . Years of Education: N/A   Occupational History  . Not on file.   Social History Main Topics  . Smoking status: Never Smoker   . Smokeless tobacco: Not on file  . Alcohol Use: No  . Drug Use: No  . Sexual Activity: Yes   Other Topics Concern  . 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  Respiratory: Negative for cough and shortness of breath.   Cardiovascular: Negative for chest pain.  Gastrointestinal: Negative for nausea, vomiting, abdominal pain and constipation.  Genitourinary: Negative for dysuria.  Musculoskeletal: Negative for myalgias, back pain and arthralgias.  Skin: Negative for rash.  Neurological: Negative for dizziness, weakness, numbness and headaches.       Objective:   Physical Exam CONSTITUTIONAL: Well developed/well nourished HEAD: Normocephalic/atraumatic EYES: EOMI/PERRL. Redness and scaling of upper and lower lids bilaterally.  ENMT: Mucous membranes moist NECK: supple no meningeal signs SPINE/BACK:entire spine nontender CV: S1/S2 noted, no murmurs/rubs/gallops noted LUNGS: Lungs are clear to auscultation bilaterally, no apparent distress ABDOMEN: soft, nontender, no rebound or guarding, bowel sounds noted throughout abdomen GU:no cva tenderness NEURO: Pt is awake/alert/appropriate, moves all extremitiesx4.  No facial droop.   EXTREMITIES: pulses normal/equal, full ROM SKIN: warm, color normal PSYCH: no abnormalities of mood noted, alert and oriented to situation Genital exam is normal with a surgically absent prostate.      Assessment & Plan:  Pt doing well. Flu shot given today. UTD on colonoscopy. Marland Kitchen He was given erythromycin  for his blepharitis. He has been released by his urologist and will have his yearly checks through our office. I personally performed the services described in this documentation, which was scribed in my presence. The recorded information has been reviewed and is accurate.

## 2013-12-14 NOTE — Progress Notes (Deleted)
   Subjective:    Patient ID: Adam Cunningham, male    DOB: 08/22/1960, 53 y.o.   MRN: [id number]  HPI    Review of Systems     Objective:   Physical Exam        Assessment & Plan:   

## 2013-12-15 LAB — PSA

## 2013-12-15 MED ORDER — ALLOPURINOL 100 MG PO TABS: 100.0000 mg | ORAL_TABLET | Freq: Every day | ORAL | Status: DC

## 2013-12-15 NOTE — Addendum Note (Signed)
Addended by: Constance Goltz on: 12/15/2013 02:25 PM   Modules accepted: Orders, SmartSet

## 2013-12-27 ENCOUNTER — Other Ambulatory Visit: Payer: Self-pay | Admitting: Emergency Medicine

## 2013-12-27 ENCOUNTER — Telehealth: Payer: Self-pay | Admitting: *Deleted

## 2013-12-27 MED ORDER — INDOMETHACIN 50 MG PO CAPS: 50.0000 mg | ORAL_CAPSULE | Freq: Two times a day (BID) | ORAL | Status: DC

## 2013-12-27 MED ORDER — COLCHICINE 0.6 MG PO TABS: 0.6000 mg | ORAL_TABLET | Freq: Every day | ORAL | Status: DC

## 2013-12-27 MED ORDER — HYDROCODONE-ACETAMINOPHEN 5-325 MG PO TABS: 1.0000 | ORAL_TABLET | Freq: Four times a day (QID) | ORAL | Status: DC | PRN

## 2013-12-27 NOTE — Telephone Encounter (Signed)
Wife states that pt is having a bad gout flare up. He is asking for a refill on his hydrocodone.  I have refilled the colcrys and indocin

## 2013-12-27 NOTE — Telephone Encounter (Signed)
Pt given script

## 2013-12-27 NOTE — Telephone Encounter (Signed)
Prescription printed

## 2013-12-28 LAB — IFOBT (OCCULT BLOOD): IFOBT: NEGATIVE

## 2014-04-29 ENCOUNTER — Encounter: Payer: Self-pay | Admitting: Emergency Medicine

## 2014-04-29 ENCOUNTER — Ambulatory Visit (INDEPENDENT_AMBULATORY_CARE_PROVIDER_SITE_OTHER): Payer: 59

## 2014-04-29 ENCOUNTER — Ambulatory Visit (INDEPENDENT_AMBULATORY_CARE_PROVIDER_SITE_OTHER): Payer: 59 | Admitting: Emergency Medicine

## 2014-04-29 VITALS — BP 132/78 | HR 78 | Temp 98.1°F | Resp 16 | Ht 73.0 in | Wt 244.0 lb

## 2014-04-29 DIAGNOSIS — R05 Cough: Secondary | ICD-10-CM | POA: Diagnosis not present

## 2014-04-29 DIAGNOSIS — R059 Cough, unspecified: Secondary | ICD-10-CM

## 2014-04-29 DIAGNOSIS — J029 Acute pharyngitis, unspecified: Secondary | ICD-10-CM | POA: Diagnosis not present

## 2014-04-29 LAB — POCT RAPID STREP A (OFFICE): Rapid Strep A Screen: NEGATIVE

## 2014-04-29 MED ORDER — FIRST-DUKES MOUTHWASH MT SUSP: OROMUCOSAL | Status: DC

## 2014-04-29 MED ORDER — MOMETASONE FUROATE 50 MCG/ACT NA SUSP: 2.0000 | Freq: Every day | NASAL | Status: DC

## 2014-04-29 MED ORDER — BENZONATATE 100 MG PO CAPS: 100.0000 mg | ORAL_CAPSULE | Freq: Three times a day (TID) | ORAL | Status: DC | PRN

## 2014-04-29 MED ORDER — AZITHROMYCIN 250 MG PO TABS: ORAL_TABLET | ORAL | Status: DC

## 2014-04-29 NOTE — Progress Notes (Signed)
   Subjective:    Patient ID: ILIJA MAXIM, male    DOB: Oct 02, 1960, 54 y.o.   MRN: [id number]  HPI patient states for the last week he has been bothered with severe sore throat. He also initially had flulike symptoms with aching and cough. He continues to have a severe sore throat. He has not had any further fever. 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    Review of Systems     Objective:   Physical Exam  Constitutional: He appears well-developed and well-nourished.  Eyes: Pupils are equal, round, and reactive to light.  Neck: No tracheal deviation present. No thyromegaly present.  Cardiovascular: Normal rate, regular rhythm and normal heart sounds.    [facility] reading (PRIMARY) by  Dr.Jersi Mcmaster please comment there is prominence right him for hilar area and some scarring left base. Results for orders placed or performed in visit on 04/29/14  POCT rapid strep A  Result Value Ref Range   Rapid Strep A Screen Negative Negative         Assessment & Plan:  Patient placed on tessalon pearls, Z-pack and [employer]'s mouthwash. Awaiting reading from radiologist. He has had occupational exposure to toxin being a Airline pilot. May proceed with CT if radiologist sees any suspicious areas.

## 2014-04-29 NOTE — Progress Notes (Deleted)
   Subjective:    Patient ID: Adam Cunningham, male    DOB: 04/22/1960, 54 y.o.   MRN: [id number]  HPI    Review of Systems     Objective:   Physical Exam        Assessment & Plan:

## 2014-04-29 NOTE — Patient Instructions (Signed)
Acute Bronchitis Bronchitis is inflammation of the airways that extend from the windpipe into the lungs (bronchi). The inflammation often causes mucus to develop. This leads to a cough, which is the most common symptom of bronchitis.  In acute bronchitis, the condition usually develops suddenly and goes away over time, usually in a couple weeks. Smoking, allergies, and asthma can make bronchitis worse. Repeated episodes of bronchitis may cause further lung problems.  CAUSES Acute bronchitis is most often caused by the same virus that causes a cold. The virus can spread from person to person (contagious) through coughing, sneezing, and touching contaminated objects. SIGNS AND SYMPTOMS   Cough.   Fever.   Coughing up mucus.   Body aches.   Chest congestion.   Chills.   Shortness of breath.   Sore throat.  DIAGNOSIS  Acute bronchitis is usually diagnosed through a physical exam. Your health care provider will also ask you questions about your medical history. Tests, such as chest X-rays, are sometimes done to rule out other conditions.  TREATMENT  Acute bronchitis usually goes away in a couple weeks. Oftentimes, no medical treatment is necessary. Medicines are sometimes given for relief of fever or cough. Antibiotic medicines are usually not needed but may be prescribed in certain situations. In some cases, an inhaler may be recommended to help reduce shortness of breath and control the cough. A cool mist vaporizer may also be used to help thin bronchial secretions and make it easier to clear the chest.  HOME CARE INSTRUCTIONS  Get plenty of rest.   Drink enough fluids to keep your urine clear or pale yellow (unless you have a medical condition that requires fluid restriction). Increasing fluids may help thin your respiratory secretions (sputum) and reduce chest congestion, and it will prevent dehydration.   Take medicines only as directed by your health care provider.  If  you were prescribed an antibiotic medicine, finish it all even if you start to feel better.  Avoid smoking and secondhand smoke. Exposure to cigarette smoke or irritating chemicals will make bronchitis worse. If you are a smoker, consider using nicotine gum or skin patches to help control withdrawal symptoms. Quitting smoking will help your lungs heal faster.   Reduce the chances of another bout of acute bronchitis by washing your hands frequently, avoiding people with cold symptoms, and trying not to touch your hands to your mouth, nose, or eyes.   Keep all follow-up visits as directed by your health care provider.  SEEK MEDICAL CARE IF: Your symptoms do not improve after 1 week of treatment.  SEEK IMMEDIATE MEDICAL CARE IF:  You develop an increased fever or chills.   You have chest pain.   You have severe shortness of breath.  You have bloody sputum.   You develop dehydration.  You faint or repeatedly feel like you are going to pass out.  You develop repeated vomiting.  You develop a severe headache. MAKE SURE YOU:   Understand these instructions.  Will watch your condition.  Will get help right away if you are not doing well or get worse. Document Released: 02/15/2004 Document Revised: 05/24/2013 Document Reviewed: 06/30/2012 [facility] Patient Information 2015 [place], [place]. This information is not intended to replace advice given to you by your health care provider. Make sure you discuss any questions you have with your health care provider. Pharyngitis Pharyngitis is redness, pain, and swelling (inflammation) of your pharynx.  CAUSES  Pharyngitis is usually caused by infection. Most of the time,  these infections are from viruses (viral) and are part of a cold. However, sometimes pharyngitis is caused by bacteria (bacterial). Pharyngitis can also be caused by allergies. Viral pharyngitis may be spread from person to person by coughing, sneezing, and personal items or  utensils (cups, forks, spoons, toothbrushes). Bacterial pharyngitis may be spread from person to person by more intimate contact, such as kissing.  SIGNS AND SYMPTOMS  Symptoms of pharyngitis include:   Sore throat.   Tiredness (fatigue).   Low-grade fever.   Headache.  Joint pain and muscle aches.  Skin rashes.  Swollen lymph nodes.  Plaque-like film on throat or tonsils (often seen with bacterial pharyngitis). DIAGNOSIS  Your health care provider will ask you questions about your illness and your symptoms. Your medical history, along with a physical exam, is often all that is needed to diagnose pharyngitis. Sometimes, a rapid strep test is done. Other lab tests may also be done, depending on the suspected cause.  TREATMENT  Viral pharyngitis will usually get better in 3-4 days without the use of medicine. Bacterial pharyngitis is treated with medicines that kill germs (antibiotics).  HOME CARE INSTRUCTIONS   Drink enough water and fluids to keep your urine clear or pale yellow.   Only take over-the-counter or prescription medicines as directed by your health care provider:   If you are prescribed antibiotics, make sure you finish them even if you start to feel better.   Do not take aspirin.   Get lots of rest.   Gargle with 8 oz of salt water ( tsp of salt per 1 qt of water) as often as every 1-2 hours to soothe your throat.   Throat lozenges (if you are not at risk for choking) or sprays may be used to soothe your throat. SEEK MEDICAL CARE IF:   You have large, tender lumps in your neck.  You have a rash.  You cough up green, yellow-brown, or bloody spit. SEEK IMMEDIATE MEDICAL CARE IF:   Your neck becomes stiff.  You drool or are unable to swallow liquids.  You vomit or are unable to keep medicines or liquids down.  You have severe pain that does not go away with the use of recommended medicines.  You have trouble breathing (not caused by a stuffy  nose). MAKE SURE YOU:   Understand these instructions.  Will watch your condition.  Will get help right away if you are not doing well or get worse. Document Released: 01/07/2005 Document Revised: 10/28/2012 Document Reviewed: 09/14/2012 [facility] Patient Information 2015 [place], [place]. This information is not intended to replace advice given to you by your health care provider. Make sure you discuss any questions you have with your health care provider.

## 2014-05-01 LAB — CULTURE, GROUP A STREP: Organism ID, Bacteria: NORMAL

## 2014-12-04 ENCOUNTER — Ambulatory Visit: Payer: 59

## 2014-12-07 ENCOUNTER — Encounter: Payer: 59 | Admitting: Urgent Care

## 2014-12-26 ENCOUNTER — Ambulatory Visit (INDEPENDENT_AMBULATORY_CARE_PROVIDER_SITE_OTHER): Payer: 59 | Admitting: Family Medicine

## 2014-12-26 ENCOUNTER — Encounter: Payer: Self-pay | Admitting: Family Medicine

## 2014-12-26 VITALS — BP 116/76 | HR 76 | Temp 98.5°F | Resp 16 | Ht 71.0 in | Wt 235.0 lb

## 2014-12-26 DIAGNOSIS — M545 Low back pain, unspecified: Secondary | ICD-10-CM

## 2014-12-26 DIAGNOSIS — M1009 Idiopathic gout, multiple sites: Secondary | ICD-10-CM | POA: Diagnosis not present

## 2014-12-26 DIAGNOSIS — E785 Hyperlipidemia, unspecified: Secondary | ICD-10-CM | POA: Diagnosis not present

## 2014-12-26 DIAGNOSIS — Z0000 Encounter for general adult medical examination without abnormal findings: Secondary | ICD-10-CM

## 2014-12-26 DIAGNOSIS — L301 Dyshidrosis [pompholyx]: Secondary | ICD-10-CM | POA: Diagnosis not present

## 2014-12-26 DIAGNOSIS — M109 Gout, unspecified: Secondary | ICD-10-CM

## 2014-12-26 DIAGNOSIS — Z130 Encounter for screening for diseases of the blood and blood-forming organs and certain disorders involving the immune mechanism: Secondary | ICD-10-CM

## 2014-12-26 DIAGNOSIS — Z8546 Personal history of malignant neoplasm of prostate: Secondary | ICD-10-CM | POA: Diagnosis not present

## 2014-12-26 LAB — CBC
HEMATOCRIT: 46.1 % (ref 39.0–52.0)
Hemoglobin: 16 g/dL (ref 13.0–17.0)
MCH: 29.7 pg (ref 26.0–34.0)
MCHC: 34.7 g/dL (ref 30.0–36.0)
MCV: 85.7 fL (ref 78.0–100.0)
MPV: 9.7 fL (ref 8.6–12.4)
PLATELETS: 185 10*3/uL (ref 150–400)
RBC: 5.38 MIL/uL (ref 4.22–5.81)
RDW: 13.5 % (ref 11.5–15.5)
WBC: 6.5 10*3/uL (ref 4.0–10.5)

## 2014-12-26 LAB — LIPID PANEL
CHOL/HDL RATIO: 4.4 ratio (ref <=5.0)
CHOLESTEROL: 177 mg/dL (ref 125–200)
HDL: 40 mg/dL (ref >=40)
LDL Cholesterol: 104 mg/dL (ref <130)
Triglycerides: 167 mg/dL — ABNORMAL HIGH (ref <150)
VLDL: 33 mg/dL — ABNORMAL HIGH (ref <30)

## 2014-12-26 LAB — COMPLETE METABOLIC PANEL WITH GFR
ALK PHOS: 48 U/L (ref 40–115)
ALT: 17 U/L (ref 9–46)
AST: 17 U/L (ref 10–35)
Albumin: 4.7 g/dL (ref 3.6–5.1)
BUN: 13 mg/dL (ref 7–25)
CALCIUM: 8.8 mg/dL (ref 8.6–10.3)
CHLORIDE: 104 mmol/L (ref 98–110)
CO2: 25 mmol/L (ref 20–31)
Creat: 0.92 mg/dL (ref 0.70–1.33)
GFR, Est African American: >89 mL/min (ref >=60)
GFR, Est Non African American: >89 mL/min (ref >=60)
Glucose, Bld: 84 mg/dL (ref 65–99)
POTASSIUM: 3.8 mmol/L (ref 3.5–5.3)
SODIUM: 140 mmol/L (ref 135–146)
Total Bilirubin: 0.9 mg/dL (ref 0.2–1.2)
Total Protein: 7.1 g/dL (ref 6.1–8.1)

## 2014-12-26 LAB — URIC ACID: Uric Acid, Serum: 7.7 mg/dL (ref 4.0–7.8)

## 2014-12-26 MED ORDER — HYDROCODONE-ACETAMINOPHEN 5-325 MG PO TABS: 1.0000 | ORAL_TABLET | Freq: Four times a day (QID) | ORAL | Status: DC | PRN

## 2014-12-26 MED ORDER — CYCLOBENZAPRINE HCL 5 MG PO TABS: ORAL_TABLET | ORAL | Status: DC

## 2014-12-26 MED ORDER — ALLOPURINOL 100 MG PO TABS: 100.0000 mg | ORAL_TABLET | Freq: Two times a day (BID) | ORAL | Status: DC

## 2014-12-26 NOTE — Progress Notes (Signed)
Subjective:    Patient ID: Adam Cunningham, male    DOB: [birth year], 54 y.o.   MRN: [id number] By signing my name below, I, Zola Button, attest that this documentation has been prepared under the direction and in the presence of Merri Ray, MD.  Electronically Signed: Zola Button, Medical Scribe. 12/26/2014. 11:45 AM.  HPI HPI Comments: Adam Cunningham is a 54 y.o. male who presents to the [facility] for an annual exam. His last physical was with Dr. Everlene Farrier in 2014. But he had seen me for his physical in 2013. He has a history of prostate cancer with radical prostatectomy in 2009 and hyperlipidemia. Also history of gout and low testosterone. He is fasting today. No specific concerns today. 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 He denies smoking history, but he had been using dip tobacco in the past.  Cancer screening:  Colon cancer screening - Colonoscopy in August 2010, mild diverticulosis, otherwise normal. Repeat in 10 years. Done by Dr. Ardis Hughs. Skin cancer screening - No personal history of skin cancer. No FMHx of skin cancers. No recent skin findings. ABCD screening techniques discussed. Prostate cancer screening - He has a history of prostate cancer with radical prostatectomy in 2009 by Dr. Alinda Money. Most recently followed by Dr. Karsten Ro. Had been released by his urologist to have yearly checks at our office. No difficulty with urination or incontinence. He had been having DREs with Dr. Everlene Farrier and his urologist, but he is unsure why.  Lab Results  Component Value Date   PSA <0.01 12/14/2013   PSA <0.01 12/08/2012   PSA <0.01 11/28/2011    Immunizations:  Immunization History  Administered Date(s) Administered  . Influenza Split 12/09/2011  . Influenza,inj,Quad PF,36+ Mos 12/08/2012, 12/14/2013  . Influenza-Unspecified 11/22/2014  . Tdap 10/25/2010    Depression screening:  Depression screen [facility] 2/9 12/26/2014 12/14/2013 09/14/2013  Decreased  Interest 0 0 0  Down, Depressed, Hopeless 0 0 0  PHQ - 2 Score 0 0 0   Fall screening: 0 in the past year.  Optho: He last saw his eye doctor about 2 months ago.  Visual Acuity Screening   Right eye Left eye Both eyes  Without correction:     With correction: 20/20 20/20 20/20     Dentist: He had routine cleaning 2 weeks ago.  Exercise: Patient notes he gets exercise doing work around the house, at least 150 minutes a week. Body mass index is 32.79 kg/(m^2).   Hyperlipidemia: He is not on any medication for this. Recommended weight loss. Lab Results  Component Value Date   CHOL 185 12/14/2013   HDL 39* 12/14/2013   LDLCALC 104* 12/14/2013   TRIG 212* 12/14/2013   CHOLHDL 4.7 12/14/2013    Wt Readings from Last 3 Encounters:  12/26/14 235 lb (106.595 kg)  04/29/14 244 lb (110.678 kg)  12/14/13 231 lb (104.781 kg)    Gout: His uric acid was 8.3 on November 2015. He has colchicine if needed. Patient is currently on allopurinol to 100 mg qd. He has not tried increasing his allopurinol to twice a day. His last gout flare was a few months ago, and he has about 2-3 flares a year. He takes colchicine when he has the flares. Patient takes hydrocodone when his flares are more severe, but notes he has run out.  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  He has had skelaxin and flexeril in the past for back pain and would like some for his current pain. He does not remember what dose he had been on. Patient denies lower extremity weakness, incontinence, and saddle anesthesia.  Patient Active Problem List   Diagnosis Date Noted  . Hyperlipidemia 11/26/2011  . Gout 11/26/2011  . Low testosterone 11/26/2011  . H/O prostate cancer 05/26/2007   Past Medical History  Diagnosis Date  . Cancer [facility])     prostate   Past Surgical History  Procedure Laterality Date  . Prostate surgery    . Biceps tendon  repair     No Known Allergies Prior to Admission medications   Medication Sig Start Date End Date Taking? Authorizing Provider  allopurinol (ZYLOPRIM) 100 MG tablet Take 1 tablet (100 mg total) by mouth daily. 12/15/13  Yes Darlyne Russian, MD  colchicine 0.6 MG tablet Take 1 tablet (0.6 mg total) by mouth daily. 12/27/13  Yes Darlyne Russian, MD  erythromycin [facility]) ophthalmic ointment Place 1 application into both eyes at bedtime. 12/14/13  Yes Darlyne Russian, MD  HYDROcodone-acetaminophen (NORCO) 5-325 MG per tablet Take 1 tablet by mouth every 6 (six) hours as needed for moderate pain. 12/27/13  Yes Darlyne Russian, MD  indomethacin (INDOCIN) 50 MG capsule Take 1 capsule (50 mg total) by mouth 2 (two) times daily with a meal. 12/27/13  Yes Darlyne Russian, MD  mometasone (NASONEX) 50 MCG/ACT nasal spray Place 2 sprays into the nose daily. 04/29/14   Darlyne Russian, MD  zoster vaccine live, PF, (ZOSTAVAX) 96295 UNT/0.65ML injection Inject 19,400 Units into the skin once. Patient not taking: Reported on 12/14/2013 12/08/12   Darlyne Russian, MD   Social History   Social History  . Marital Status: Married    Spouse Name: N/A  . Number of Children: N/A  . Years of Education: N/A   Occupational History  . 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 Smoking status: Never Smoker   . Smokeless tobacco: Not on file  . Alcohol Use: No  . Drug Use: No  . Sexual Activity: Yes   Other Topics Concern  . 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   Social research officer, government   Education: [employer]   Exercise: Yes     Review of Systems 13 point ROS reviewed on patient health survey. Negative other than listed above or in nursing note. See nursing note.     Objective:   Physical Exam  Constitutional: He is oriented to person, place, and time. He appears well-developed and well-nourished.  HENT:  Head: Normocephalic and atraumatic.  Right Ear: External ear  normal.  Left Ear: External ear normal.  Mouth/Throat: Oropharynx is clear and moist.  Eyes: Conjunctivae and EOM are normal. Pupils are equal, round, and reactive to light.  Neck: Normal range of motion. Neck supple. No thyromegaly present.  Cardiovascular: Normal rate, regular rhythm, normal heart sounds and intact distal pulses.   Pulmonary/Chest: Effort normal and breath sounds normal. No respiratory distress. He has no wheezes.  Abdominal: Soft. He exhibits no distension. There is no tenderness. Hernia confirmed negative in the right inguinal area and confirmed negative in the left inguinal area.  Genitourinary:  DRE deferred with prior prostatectomy.   Musculoskeletal: Normal range of motion. He exhibits tenderness. He exhibits no edema.  Minimal tenderness over the left lower  paraspinals, just above the iliac crest. SI joint non-tender. Lumbar ROM flexion to 90 degrees, otherwise normal. Negative seated straight leg raise.  Lymphadenopathy:    He has no cervical adenopathy.  Neurological: He is alert and oriented to person, place, and time. He has normal reflexes.  Skin: Skin is warm and dry.  Slight peeling and scaling of right palmar hand, small area on right foot.  Psychiatric: He has a normal mood and affect. His behavior is normal.  Vitals reviewed.     Filed Vitals:   12/26/14 1045  BP: 116/76  Pulse: 76  Temp: 98.5 F (36.9 C)  TempSrc: Oral  Resp: 16  Height: 5\' 11"  (1.803 m)  Weight: 235 lb (106.595 kg)  SpO2: 96%       Assessment & Plan:   Adam Cunningham is a 53 y.o. male Annual physical exam  -anticipatory guidance as below in AVS, screening labs above. Health maintenance items as above in HPI discussed/recommended as applicable.   Left-sided low back pain without sciatica - Plan: cyclobenzaprine (FLEXERIL) 5 MG tablet  -strain/overuse likely.  Trial of flexeril, sx care and HEP by handout.   -rtc precautions if persistent.   Gouty arthritis - Plan:  Uric Acid, HYDROcodone-acetaminophen (NORCO) 5-325 MG tablet, allopurinol (ZYLOPRIM) 100 MG tablet  - uric acid pending. Based on flair frequency - will increase allopurinol to 100mg  BID as planned at prior OV.   -Continue colchicine if needed at acute flair and refilled Hydrocodone if needed for acute pain.   Hyperlipidemia - Plan: COMPLETE METABOLIC PANEL WITH GFR, Lipid panel  -labs pending. Encouraged increase exercise with ultimate of goal of weight loss to get BMI closer to or under 30.   History of prostate cancer - Plan: PSA  -PSA pending. DRE deferred as prior prostatectomy.   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   -Eucerin or other moisturizer, otc cortisone if needed. Rtc precautions if persistent or worsening.   Meds ordered this encounter  Medications  . cyclobenzaprine (FLEXERIL) 5 MG tablet    Sig: 1 pill by mouth up to every 8 hours as needed. Start with one pill by mouth each bedtime as needed due to sedation    Dispense:  15 tablet    Refill:  0  . HYDROcodone-acetaminophen (NORCO) 5-325 MG tablet    Sig: Take 1 tablet by mouth every 6 (six) hours as needed for moderate pain.    Dispense:  20 tablet    Refill:  0  . allopurinol (ZYLOPRIM) 100 MG tablet    Sig: Take 1 tablet (100 mg total) by mouth 2 (two) times daily.    Dispense:  180 tablet    Refill:  1   Patient Instructions  Flexeril if needed for your back., see other info below on other treatments, but if pain persists - return for xray or other treatment.   Increase allopurinol to 2 per day.  This should lessen gout flares.   As your hand rash is improving - over the counter hydrocortisone if needed for itching.  eucerin for dry skin. Recheck if this rash worsens.   You should receive a call or letter about your lab results within the next week to 10 days.    Keeping you healthy  Get these tests  Blood pressure-  Have your blood pressure checked once a year by your healthcare provider.  Normal blood pressure is  120/80  Weight- Have your body mass index (BMI) calculated to screen for obesity.  BMI is a measure of body fat based on height and weight. You can also calculate your own BMI at [employer].  Cholesterol- Have your cholesterol checked every year.  Diabetes- Have your blood sugar checked regularly if you have high blood pressure, high cholesterol, have a family history of diabetes or if you are overweight.  Screening for Colon Cancer- Colonoscopy starting at age 82.  Screening may begin sooner depending on your family history and other health conditions. Follow up colonoscopy as directed by your Gastroenterologist.  Screening for Prostate Cancer- Both blood work (PSA) and a rectal exam help screen for Prostate Cancer.  Screening begins at age 103 with African-American men and at age 103 with Caucasian men.  Screening may begin sooner depending on your family history.  Take these medicines  Aspirin- One aspirin daily can help prevent Heart disease and Stroke.  Flu shot- Every fall.  Tetanus- Every 10 years.  Zostavax- Once after the age of 32 to prevent Shingles.  Pneumonia shot- Once after the age of 38; if you are younger than 66, ask your healthcare provider if you need a Pneumonia shot.  Take these steps  Don't smoke- If you do smoke, talk to your doctor about quitting.  For tips on how to quit, go to www.smokefree.gov or call 1-800-QUIT-NOW.  Be physically active- Exercise 5 days a week for at least 30 minutes.  If you are not already physically active start slow and gradually work up to 30 minutes of moderate physical activity.  Examples of moderate activity include walking briskly, mowing the yard, dancing, swimming, bicycling, etc.  Eat a healthy diet- Eat a variety of healthy food such as fruits, vegetables, low fat milk, low fat cheese, yogurt, lean meant, poultry,  fish, beans, tofu, etc. For more information go to www.thenutritionsource.org  Drink alcohol in moderation- Limit alcohol intake to less than two drinks a day. Never drink and drive.  Dentist- Brush and floss twice daily; visit your dentist twice a year.  Depression- Your emotional health is as important as your physical health. If you're feeling down, or losing interest in things you would normally enjoy please talk to your healthcare provider.  Eye exam- Visit your eye doctor every year.  Safe sex- If you may be exposed to a sexually transmitted infection, use a condom.  Seat belts- Seat belts can save your life; always wear one.  Smoke/Carbon Monoxide detectors- These detectors need to be installed on the appropriate level of your home.  Replace batteries at least once a year.  Skin cancer- When out in the sun, cover up and use sunscreen 15 SPF or higher.  Violence- If anyone is threatening you, please tell your healthcare provider.  Living Will/ Health care power of attorney- Speak with your healthcare provider and family.   Gout Gout is an inflammatory arthritis caused by a buildup of uric acid crystals in the joints. Uric acid is a chemical that is normally present in the blood. When the level of uric acid in the blood is too high it can form crystals that deposit in your joints and tissues. This causes joint redness, soreness, and swelling (inflammation). Repeat attacks are common. Over time, uric acid crystals can form into masses (tophi) near a joint, destroying bone and causing disfigurement. Gout is treatable and often preventable. CAUSES  The disease begins with elevated levels of uric acid in the blood. Uric acid  is produced by your body when it breaks down a naturally found substance called purines. Certain foods you eat, such as meats and fish, contain high amounts of purines. Causes of an elevated uric acid level include:  Being passed down from parent to child  (heredity).  Diseases that cause increased uric acid production (such as obesity, psoriasis, and certain cancers).  Excessive alcohol use.  Diet, especially diets rich in meat and seafood.  Medicines, including certain cancer-fighting medicines (chemotherapy), water pills (diuretics), and aspirin.  Chronic kidney disease. The kidneys are no longer able to remove uric acid well.  Problems with metabolism. Conditions strongly associated with gout include:  Obesity.  High blood pressure.  High cholesterol.  Diabetes. Not everyone with elevated uric acid levels gets gout. It is not understood why some people get gout and others do not. Surgery, joint injury, and eating too much of certain foods are some of the factors that can lead to gout attacks. SYMPTOMS   An attack of gout comes on quickly. It causes intense pain with redness, swelling, and warmth in a joint.  Fever can occur.  Often, only one joint is involved. Certain joints are more commonly involved:  Base of the big toe.  Knee.  Ankle.  Wrist.  Finger. Without treatment, an attack usually goes away in a few days to weeks. Between attacks, you usually will not have symptoms, which is different from many other forms of arthritis. DIAGNOSIS  Your caregiver will suspect gout based on your symptoms and exam. In some cases, tests may be recommended. The tests may include:  Blood tests.  Urine tests.  X-rays.  Joint fluid exam. This exam requires a needle to remove fluid from the joint (arthrocentesis). Using a microscope, gout is confirmed when uric acid crystals are seen in the joint fluid. TREATMENT  There are two phases to gout treatment: treating the sudden onset (acute) attack and preventing attacks (prophylaxis).  Treatment of an Acute Attack.  Medicines are used. These include anti-inflammatory medicines or steroid medicines.  An injection of steroid medicine into the affected joint is sometimes  necessary.  The painful joint is rested. Movement can worsen the arthritis.  You may use warm or cold treatments on painful joints, depending which works best for you.  Treatment to Prevent Attacks.  If you suffer from frequent gout attacks, your caregiver may advise preventive medicine. These medicines are started after the acute attack subsides. These medicines either help your kidneys eliminate uric acid from your body or decrease your uric acid production. You may need to stay on these medicines for a very long time.  The early phase of treatment with preventive medicine can be associated with an increase in acute gout attacks. For this reason, during the first few months of treatment, your caregiver may also advise you to take medicines usually used for acute gout treatment. Be sure you understand your caregiver's directions. Your caregiver may make several adjustments to your medicine dose before these medicines are effective.  Discuss dietary treatment with your caregiver or dietitian. Alcohol and drinks high in sugar and fructose and foods such as meat, poultry, and seafood can increase uric acid levels. Your caregiver or dietitian can advise you on drinks and foods that should be limited. HOME CARE INSTRUCTIONS   Do not take aspirin to relieve pain. This raises uric acid levels.  Only take over-the-counter or prescription medicines for pain, discomfort, or fever as directed by your caregiver.  Rest the joint as  much as possible. When in bed, keep sheets and blankets off painful areas.  Keep the affected joint raised (elevated).  Apply warm or cold treatments to painful joints. Use of warm or cold treatments depends on which works best for you.  Use crutches if the painful joint is in your leg.  Drink enough fluids to keep your urine clear or pale yellow. This helps your body get rid of uric acid. Limit alcohol, sugary drinks, and fructose drinks.  Follow your dietary  instructions. Pay careful attention to the amount of protein you eat. Your daily diet should emphasize fruits, vegetables, whole grains, and fat-free or low-fat milk products. Discuss the use of coffee, vitamin C, and cherries with your caregiver or dietitian. These may be helpful in lowering uric acid levels.  Maintain a healthy body weight. SEEK MEDICAL CARE IF:   You develop diarrhea, vomiting, or any side effects from medicines.  You do not feel better in 24 hours, or you are getting worse. SEEK IMMEDIATE MEDICAL CARE IF:   Your joint becomes suddenly more tender, and you have chills or a fever. MAKE SURE YOU:   Understand these instructions.  Will watch your condition.  Will get help right away if you are not doing well or get worse.   This information is not intended to replace advice given to you by your health care provider. Make sure you discuss any questions you have with your health care provider.   Document Released: 01/05/2000 Document Revised: 01/28/2014 Document Reviewed: 08/21/2011 Elsevier Interactive Patient Education 2016 [place] Strain With Rehab A strain is an injury in which a tendon or muscle is torn. The muscles and tendons of the lower back are vulnerable to strains. However, these muscles and tendons are very strong and require a great force to be injured. Strains are classified into three categories. Grade 1 strains cause pain, but the tendon is not lengthened. Grade 2 strains include a lengthened ligament, due to the ligament being stretched or partially ruptured. With grade 2 strains there is still function, although the function may be decreased. Grade 3 strains involve a complete tear of the tendon or muscle, and function is usually impaired. SYMPTOMS   Pain in the lower back.  Pain that affects one side more than the other.  Pain that gets worse with movement and may be felt in the hip, buttocks, or back of the thigh.  Muscle spasms  of the muscles in the back.  Swelling along the muscles of the back.  Loss of strength of the back muscles.  Crackling sound (crepitation) when the muscles are touched. CAUSES  Lower back strains occur when a force is placed on the muscles or tendons that is greater than they can handle. Common causes of injury include:  Prolonged overuse of the muscle-tendon units in the lower back, usually from incorrect posture.  A single violent injury or force applied to the back. RISK INCREASES WITH:  Sports that involve twisting forces on the spine or a lot of bending at the waist (football, rugby, weightlifting, bowling, golf, tennis, speed skating, racquetball, swimming, running, gymnastics, diving).  Poor strength and flexibility.  Failure to warm up properly before activity.  Family history of lower back pain or disk disorders.  Previous back injury or surgery (especially fusion).  Poor posture with lifting, especially heavy objects.  Prolonged sitting, especially with poor posture. PREVENTION   Learn and use proper posture when sitting or lifting (maintain proper posture  when sitting, lift using the knees and legs, not at the waist).  Warm up and stretch properly before activity.  Allow for adequate recovery between workouts.  Maintain physical fitness:  Strength, flexibility, and endurance.  Cardiovascular fitness. PROGNOSIS  If treated properly, lower back strains usually heal within 6 weeks. RELATED COMPLICATIONS   Recurring symptoms, resulting in a chronic problem.  Chronic inflammation, scarring, and partial muscle-tendon tear.  Delayed healing or resolution of symptoms.  Prolonged disability. TREATMENT  Treatment first involves the use of ice and medicine, to reduce pain and inflammation. The use of strengthening and stretching exercises may help reduce pain with activity. These exercises may be performed at home or with a therapist. Severe injuries may require  referral to a therapist for further evaluation and treatment, such as ultrasound. Your caregiver may advise that you wear a back brace or corset, to help reduce pain and discomfort. Often, prolonged bed rest results in greater harm then benefit. Corticosteroid injections may be recommended. However, these should be reserved for the most serious cases. It is important to avoid using your back when lifting objects. At night, sleep on your back on a firm mattress with a pillow placed under your knees. If non-surgical treatment is unsuccessful, surgery may be needed.  MEDICATION   If pain medicine is needed, nonsteroidal anti-inflammatory medicines (aspirin and ibuprofen), or other minor pain relievers (acetaminophen), are often advised.  Do not take pain medicine for 7 days before surgery.  Prescription pain relievers may be given, if your caregiver thinks they are needed. Use only as directed and only as much as you need.  Ointments applied to the skin may be helpful.  Corticosteroid injections may be given by your caregiver. These injections should be reserved for the most serious cases, because they may only be given a certain number of times. HEAT AND COLD  Cold treatment (icing) should be applied for 10 to 15 minutes every 2 to 3 hours for inflammation and pain, and immediately after activity that aggravates your symptoms. Use ice packs or an ice massage.  Heat treatment may be used before performing stretching and strengthening activities prescribed by your caregiver, physical therapist, or athletic trainer. Use a heat pack or a warm water soak. SEEK MEDICAL CARE IF:   Symptoms get worse or do not improve in 2 to 4 weeks, despite treatment.  You develop numbness, weakness, or loss of bowel or bladder function.  New, unexplained symptoms develop. (Drugs used in treatment may produce side effects.) EXERCISES  RANGE OF MOTION (ROM) AND STRETCHING EXERCISES - Low Back Strain Most people with  lower back pain will find that their symptoms get worse with excessive bending forward (flexion) or arching at the lower back (extension). The exercises which will help resolve your symptoms will focus on the opposite motion.  Your physician, physical therapist or athletic trainer will help you determine which exercises will be most helpful to resolve your lower back pain. Do not complete any exercises without first consulting with your caregiver. Discontinue any exercises which make your symptoms worse until you speak to your caregiver.  If you have pain, numbness or tingling which travels down into your buttocks, leg or foot, the goal of the therapy is for these symptoms to move closer to your back and eventually resolve. Sometimes, these leg symptoms will get better, but your lower back pain may worsen. This is typically an indication of progress in your rehabilitation. Be very alert to any changes in your  symptoms and the activities in which you participated in the 24 hours prior to the change. Sharing this information with your caregiver will allow him/her to most efficiently treat your condition.  These exercises may help you when beginning to rehabilitate your injury. Your symptoms may resolve with or without further involvement from your physician, physical therapist or athletic trainer. While completing these exercises, remember:  Restoring tissue flexibility helps normal motion to return to the joints. This allows healthier, less painful movement and activity.  An effective stretch should be held for at least 30 seconds.  A stretch should never be painful. You should only feel a gentle lengthening or release in the stretched tissue. FLEXION RANGE OF MOTION AND STRETCHING EXERCISES: STRETCH - Flexion, Single Knee to Chest   Lie on a firm bed or floor with both legs extended in front of you.  Keeping one leg in contact with the floor, bring your opposite knee to your chest. Hold your leg in  place by either grabbing behind your thigh or at your knee.  Pull until you feel a gentle stretch in your lower back. Hold __________ seconds.  Slowly release your grasp and repeat the exercise with the opposite side. Repeat __________ times. Complete this exercise __________ times per day.  STRETCH - Flexion, Double Knee to Chest   Lie on a firm bed or floor with both legs extended in front of you.  Keeping one leg in contact with the floor, bring your opposite knee to your chest.  Tense your stomach muscles to support your back and then lift your other knee to your chest. Hold your legs in place by either grabbing behind your thighs or at your knees.  Pull both knees toward your chest until you feel a gentle stretch in your lower back. Hold __________ seconds.  Tense your stomach muscles and slowly return one leg at a time to the floor. Repeat __________ times. Complete this exercise __________ times per day.  STRETCH - Low Trunk Rotation  Lie on a firm bed or floor. Keeping your legs in front of you, bend your knees so they are both pointed toward the ceiling and your feet are flat on the floor.  Extend your arms out to the side. This will stabilize your upper body by keeping your shoulders in contact with the floor.  Gently and slowly drop both knees together to one side until you feel a gentle stretch in your lower back. Hold for __________ seconds.  Tense your stomach muscles to support your lower back as you bring your knees back to the starting position. Repeat the exercise to the other side. Repeat __________ times. Complete this exercise __________ times per day  EXTENSION RANGE OF MOTION AND FLEXIBILITY EXERCISES: STRETCH - Extension, Prone on Elbows   Lie on your stomach on the floor, a bed will be too soft. Place your palms about shoulder width apart and at the height of your head.  Place your elbows under your shoulders. If this is too painful, stack pillows under your  chest.  Allow your body to relax so that your hips drop lower and make contact more completely with the floor.  Hold this position for __________ seconds.  Slowly return to lying flat on the floor. Repeat __________ times. Complete this exercise __________ times per day.  RANGE OF MOTION - Extension, Prone Press Ups  Lie on your stomach on the floor, a bed will be too soft. Place your palms about shoulder width apart  and at the height of your head.  Keeping your back as relaxed as possible, slowly straighten your elbows while keeping your hips on the floor. You may adjust the placement of your hands to maximize your comfort. As you gain motion, your hands will come more underneath your shoulders.  Hold this position __________ seconds.  Slowly return to lying flat on the floor. Repeat __________ times. Complete this exercise __________ times per day.  RANGE OF MOTION- Quadruped, Neutral Spine   Assume a hands and knees position on a firm surface. Keep your hands under your shoulders and your knees under your hips. You may place padding under your knees for comfort.  Drop your head and point your tail bone toward the ground below you. This will round out your lower back like an angry cat. Hold this position for __________ seconds.  Slowly lift your head and release your tail bone so that your back sags into a large arch, like an old horse.  Hold this position for __________ seconds.  Repeat this until you feel limber in your lower back.  Now, find your "sweet spot." This will be the most comfortable position somewhere between the two previous positions. This is your neutral spine. Once you have found this position, tense your stomach muscles to support your lower back.  Hold this position for __________ seconds. Repeat __________ times. Complete this exercise __________ times per day.  STRENGTHENING EXERCISES - Low Back Strain These exercises may help you when beginning to  rehabilitate your injury. These exercises should be done near your "sweet spot." This is the neutral, low-back arch, somewhere between fully rounded and fully arched, that is your least painful position. When performed in this safe range of motion, these exercises can be used for people who have either a flexion or extension based injury. These exercises may resolve your symptoms with or without further involvement from your physician, physical therapist or athletic trainer. While completing these exercises, remember:   Muscles can gain both the endurance and the strength needed for everyday activities through controlled exercises.  Complete these exercises as instructed by your physician, physical therapist or athletic trainer. Increase the resistance and repetitions only as guided.  You may experience muscle soreness or fatigue, but the pain or discomfort you are trying to eliminate should never worsen during these exercises. If this pain does worsen, stop and make certain you are following the directions exactly. If the pain is still present after adjustments, discontinue the exercise until you can discuss the trouble with your caregiver. STRENGTHENING - Deep Abdominals, Pelvic Tilt  Lie on a firm bed or floor. Keeping your legs in front of you, bend your knees so they are both pointed toward the ceiling and your feet are flat on the floor.  Tense your lower abdominal muscles to press your lower back into the floor. This motion will rotate your pelvis so that your tail bone is scooping upwards rather than pointing at your feet or into the floor.  With a gentle tension and even breathing, hold this position for __________ seconds. Repeat __________ times. Complete this exercise __________ times per day.  STRENGTHENING - Abdominals, Crunches   Lie on a firm bed or floor. Keeping your legs in front of you, bend your knees so they are both pointed toward the ceiling and your feet are flat on the  floor. Cross your arms over your chest.  Slightly tip your chin down without bending your neck.  Tense your abdominals  and slowly lift your trunk high enough to just clear your shoulder blades. Lifting higher can put excessive stress on the lower back and does not further strengthen your abdominal muscles.  Control your return to the starting position. Repeat __________ times. Complete this exercise __________ times per day.  STRENGTHENING - Quadruped, Opposite UE/LE Lift   Assume a hands and knees position on a firm surface. Keep your hands under your shoulders and your knees under your hips. You may place padding under your knees for comfort.  Find your neutral spine and gently tense your abdominal muscles so that you can maintain this position. Your shoulders and hips should form a rectangle that is parallel with the floor and is not twisted.  Keeping your trunk steady, lift your right hand no higher than your shoulder and then your left leg no higher than your hip. Make sure you are not holding your breath. Hold this position __________ seconds.  Continuing to keep your abdominal muscles tense and your back steady, slowly return to your starting position. Repeat with the opposite arm and leg. Repeat __________ times. Complete this exercise __________ times per day.  STRENGTHENING - Lower Abdominals, Double Knee Lift  Lie on a firm bed or floor. Keeping your legs in front of you, bend your knees so they are both pointed toward the ceiling and your feet are flat on the floor.  Tense your abdominal muscles to brace your lower back and slowly lift both of your knees until they come over your hips. Be certain not to hold your breath.  Hold __________ seconds. Using your abdominal muscles, return to the starting position in a slow and controlled manner. Repeat __________ times. Complete this exercise __________ times per day.  POSTURE AND BODY MECHANICS CONSIDERATIONS - Low Back  Strain Keeping correct posture when sitting, standing or completing your activities will reduce the stress put on different body tissues, allowing injured tissues a chance to heal and limiting painful experiences. The following are general guidelines for improved posture. Your physician or physical therapist will provide you with any instructions specific to your needs. While reading these guidelines, remember:  The exercises prescribed by your provider will help you have the flexibility and strength to maintain correct postures.  The correct posture provides the best environment for your joints to work. All of your joints have less wear and tear when properly supported by a spine with good posture. This means you will experience a healthier, less painful body.  Correct posture must be practiced with all of your activities, especially prolonged sitting and standing. Correct posture is as important when doing repetitive low-stress activities (typing) as it is when doing a single heavy-load activity (lifting). RESTING POSITIONS Consider which positions are most painful for you when choosing a resting position. If you have pain with flexion-based activities (sitting, bending, stooping, squatting), choose a position that allows you to rest in a less flexed posture. You would want to avoid curling into a fetal position on your side. If your pain worsens with extension-based activities (prolonged standing, working overhead), avoid resting in an extended position such as sleeping on your stomach. Most people will find more comfort when they rest with their spine in a more neutral position, neither too rounded nor too arched. Lying on a non-sagging bed on your side with a pillow between your knees, or on your back with a pillow under your knees will often provide some relief. Keep in mind, being in any one position for  a prolonged period of time, no matter how correct your posture, can still lead to  stiffness. PROPER SITTING POSTURE In order to minimize stress and discomfort on your spine, you must sit with correct posture. Sitting with good posture should be effortless for a healthy body. Returning to good posture is a gradual process. Many people can work toward this most comfortably by using various supports until they have the flexibility and strength to maintain this posture on their own. When sitting with proper posture, your ears will fall over your shoulders and your shoulders will fall over your hips. You should use the back of the chair to support your upper back. Your lower back will be in a neutral position, just slightly arched. You may place a small pillow or folded towel at the base of your lower back for support.  When working at a desk, create an environment that supports good, upright posture. Without extra support, muscles tire, which leads to excessive strain on joints and other tissues. Keep these recommendations in mind: CHAIR:  A chair should be able to slide under your desk when your back makes contact with the back of the chair. This allows you to work closely.  The chair's height should allow your eyes to be level with the upper part of your monitor and your hands to be slightly lower than your elbows. BODY POSITION  Your feet should make contact with the floor. If this is not possible, use a foot rest.  Keep your ears over your shoulders. This will reduce stress on your neck and lower back. INCORRECT SITTING POSTURES  If you are feeling tired and unable to assume a healthy sitting posture, do not slouch or slump. This puts excessive strain on your back tissues, causing more damage and pain. Healthier options include:  Using more support, like a lumbar pillow.  Switching tasks to something that requires you to be upright or walking.  Talking a brief walk.  Lying down to rest in a neutral-spine position. PROLONGED STANDING WHILE SLIGHTLY LEANING FORWARD  When  completing a task that requires you to lean forward while standing in one place for a long time, place either foot up on a stationary 2-4 inch high object to help maintain the best posture. When both feet are on the ground, the lower back tends to lose its slight inward curve. If this curve flattens (or becomes too large), then the back and your other joints will experience too much stress, tire more quickly, and can cause pain. CORRECT STANDING POSTURES Proper standing posture should be assumed with all daily activities, even if they only take a few moments, like when brushing your teeth. As in sitting, your ears should fall over your shoulders and your shoulders should fall over your hips. You should keep a slight tension in your abdominal muscles to brace your spine. Your tailbone should point down to the ground, not behind your body, resulting in an over-extended swayback posture.  INCORRECT STANDING POSTURES  Common incorrect standing postures include a forward head, locked knees and/or an excessive swayback. WALKING Walk with an upright posture. Your ears, shoulders and hips should all line-up. PROLONGED ACTIVITY IN A FLEXED POSITION When completing a task that requires you to bend forward at your waist or lean over a low surface, try to find a way to stabilize 3 out of 4 of your limbs. You can place a hand or elbow on your thigh or rest a knee on the surface you are  reaching across. This will provide you more stability so that your muscles do not fatigue as quickly. By keeping your knees relaxed, or slightly bent, you will also reduce stress across your lower back. CORRECT LIFTING TECHNIQUES DO :   Assume a wide stance. This will provide you more stability and the opportunity to get as close as possible to the object which you are lifting.  Tense your abdominals to brace your spine. Bend at the knees and hips. Keeping your back locked in a neutral-spine position, lift using your leg muscles.  Lift with your legs, keeping your back straight.  Test the weight of unknown objects before attempting to lift them.  Try to keep your elbows locked down at your sides in order get the best strength from your shoulders when carrying an object.  Always ask for help when lifting heavy or awkward objects. INCORRECT LIFTING TECHNIQUES DO NOT:   Lock your knees when lifting, even if it is a small object.  Bend and twist. Pivot at your feet or move your feet when needing to change directions.  Assume that you can safely pick up even a paper clip without proper posture.   This information is not intended to replace advice given to you by your health care provider. Make sure you discuss any questions you have with your health care provider.   Document Released: 01/07/2005 Document Revised: 01/28/2014 Document Reviewed: 04/21/2008 [employer] Interactive Patient Education [employer].     I personally performed the services described in this documentation, which was scribed in my presence. The recorded information has been reviewed and considered, and addended by me as needed.

## 2014-12-26 NOTE — Progress Notes (Signed)
   Subjective:    Patient ID: Adam Cunningham, male    DOB: 04-05-1960, 54 y.o.   MRN: [id number]  HPI    Review of Systems  Constitutional: Negative.   HENT: Negative.   Eyes: Negative.   Respiratory: Negative.   Cardiovascular: Negative.   Gastrointestinal: Negative.   Endocrine: Negative.   Genitourinary: Negative.   Musculoskeletal: Negative.   Skin: Negative.   Allergic/Immunologic: Negative.   Neurological: Negative.   Hematological: Negative.   Psychiatric/Behavioral: Negative.        Objective:   Physical Exam        Assessment & Plan:

## 2014-12-26 NOTE — Patient Instructions (Addendum)
Flexeril if needed for your back., see other info below on other treatments, but if pain persists - return for xray or other treatment.   Increase allopurinol to 2 per day.  This should lessen gout flares.   As your hand rash is improving - over the counter hydrocortisone if needed for itching.  eucerin for dry skin. Recheck if this rash worsens.   You should receive a call or letter about your lab results within the next week to 10 days.    Keeping you healthy  Get these tests  Blood pressure- Have your blood pressure checked once a year by your healthcare provider.  Normal blood pressure is 120/80  Weight- Have your body mass index (BMI) calculated to screen for obesity.  BMI is a measure of body fat based on height and weight. You can also calculate your own BMI at [email or web address].  Cholesterol- Have your cholesterol checked every year.  Diabetes- Have your blood sugar checked regularly if you have high blood pressure, high cholesterol, have a family history of diabetes or if you are overweight.  Screening for Colon Cancer- Colonoscopy starting at age 61.  Screening may begin sooner depending on your family history and other health conditions. Follow up colonoscopy as directed by your Gastroenterologist.  Screening for Prostate Cancer- Both blood work (PSA) and a rectal exam help screen for Prostate Cancer.  Screening begins at age 53 with African-American men and at age 53 with Caucasian men.  Screening may begin sooner depending on your family history.  Take these medicines  Aspirin- One aspirin daily can help prevent Heart disease and Stroke.  Flu shot- Every fall.  Tetanus- Every 10 years.  Zostavax- Once after the age of 58 to prevent Shingles.  Pneumonia shot- Once after the age of 34; if you are younger than 72, ask your healthcare provider if you need a Pneumonia shot.  Take these steps  Don't smoke- If you do smoke, talk to your doctor about quitting.  For  tips on how to quit, go to www.smokefree.gov or call 1-800-QUIT-NOW.  Be physically active- Exercise 5 days a week for at least 30 minutes.  If you are not already physically active start slow and gradually work up to 30 minutes of moderate physical activity.  Examples of moderate activity include walking briskly, mowing the yard, dancing, swimming, bicycling, etc.  Eat a healthy diet- Eat a variety of healthy food such as fruits, vegetables, low fat milk, low fat cheese, yogurt, lean meant, poultry, fish, beans, tofu, etc. For more information go to www.thenutritionsource.org  Drink alcohol in moderation- Limit alcohol intake to less than two drinks a day. Never drink and drive.  Dentist- Brush and floss twice daily; visit your dentist twice a year.  Depression- Your emotional health is as important as your physical health. If you're feeling down, or losing interest in things you would normally enjoy please talk to your healthcare provider.  Eye exam- Visit your eye doctor every year.  Safe sex- If you may be exposed to a sexually transmitted infection, use a condom.  Seat belts- Seat belts can save your life; always wear one.  Smoke/Carbon Monoxide detectors- These detectors need to be installed on the appropriate level of your home.  Replace batteries at least once a year.  Skin cancer- When out in the sun, cover up and use sunscreen 15 SPF or higher.  Violence- If anyone is threatening you, please tell your healthcare provider.  Living Will/ Health care power  of attorney- Speak with your healthcare provider and family.   Gout Gout is an inflammatory arthritis caused by a buildup of uric acid crystals in the joints. Uric acid is a chemical that is normally present in the blood. When the level of uric acid in the blood is too high it can form crystals that deposit in your joints and tissues. This causes joint redness, soreness, and swelling (inflammation). Repeat attacks are common.  Over time, uric acid crystals can form into masses (tophi) near a joint, destroying bone and causing disfigurement. Gout is treatable and often preventable. CAUSES  The disease begins with elevated levels of uric acid in the blood. Uric acid is produced by your body when it breaks down a naturally found substance called purines. Certain foods you eat, such as meats and fish, contain high amounts of purines. Causes of an elevated uric acid level include:  Being passed down from parent to child (heredity).  Diseases that cause increased uric acid production (such as obesity, psoriasis, and certain cancers).  Excessive alcohol use.  Diet, especially diets rich in meat and seafood.  Medicines, including certain cancer-fighting medicines (chemotherapy), water pills (diuretics), and aspirin.  Chronic kidney disease. The kidneys are no longer able to remove uric acid well.  Problems with metabolism. Conditions strongly associated with gout include:  Obesity.  High blood pressure.  High cholesterol.  Diabetes. Not everyone with elevated uric acid levels gets gout. It is not understood why some people get gout and others do not. Surgery, joint injury, and eating too much of certain foods are some of the factors that can lead to gout attacks. SYMPTOMS   An attack of gout comes on quickly. It causes intense pain with redness, swelling, and warmth in a joint.  Fever can occur.  Often, only one joint is involved. Certain joints are more commonly involved:  Base of the big toe.  Knee.  Ankle.  Wrist.  Finger. Without treatment, an attack usually goes away in a few days to weeks. Between attacks, you usually will not have symptoms, which is different from many other forms of arthritis. DIAGNOSIS  Your caregiver will suspect gout based on your symptoms and exam. In some cases, tests may be recommended. The tests may include:  Blood tests.  Urine tests.  X-rays.  Joint fluid  exam. This exam requires a needle to remove fluid from the joint (arthrocentesis). Using a microscope, gout is confirmed when uric acid crystals are seen in the joint fluid. TREATMENT  There are two phases to gout treatment: treating the sudden onset (acute) attack and preventing attacks (prophylaxis).  Treatment of an Acute Attack.  Medicines are used. These include anti-inflammatory medicines or steroid medicines.  An injection of steroid medicine into the affected joint is sometimes necessary.  The painful joint is rested. Movement can worsen the arthritis.  You may use warm or cold treatments on painful joints, depending which works best for you.  Treatment to Prevent Attacks.  If you suffer from frequent gout attacks, your caregiver may advise preventive medicine. These medicines are started after the acute attack subsides. These medicines either help your kidneys eliminate uric acid from your body or decrease your uric acid production. You may need to stay on these medicines for a very long time.  The early phase of treatment with preventive medicine can be associated with an increase in acute gout attacks. For this reason, during the first few months of treatment, your caregiver may also advise you  to take medicines usually used for acute gout treatment. Be sure you understand your caregiver's directions. Your caregiver may make several adjustments to your medicine dose before these medicines are effective.  Discuss dietary treatment with your caregiver or dietitian. Alcohol and drinks high in sugar and fructose and foods such as meat, poultry, and seafood can increase uric acid levels. Your caregiver or dietitian can advise you on drinks and foods that should be limited. HOME CARE INSTRUCTIONS   Do not take aspirin to relieve pain. This raises uric acid levels.  Only take over-the-counter or prescription medicines for pain, discomfort, or fever as directed by your caregiver.  Rest  the joint as much as possible. When in bed, keep sheets and blankets off painful areas.  Keep the affected joint raised (elevated).  Apply warm or cold treatments to painful joints. Use of warm or cold treatments depends on which works best for you.  Use crutches if the painful joint is in your leg.  Drink enough fluids to keep your urine clear or pale yellow. This helps your body get rid of uric acid. Limit alcohol, sugary drinks, and fructose drinks.  Follow your dietary instructions. Pay careful attention to the amount of protein you eat. Your daily diet should emphasize fruits, vegetables, whole grains, and fat-free or low-fat milk products. Discuss the use of coffee, vitamin C, and cherries with your caregiver or dietitian. These may be helpful in lowering uric acid levels.  Maintain a healthy body weight. SEEK MEDICAL CARE IF:   You develop diarrhea, vomiting, or any side effects from medicines.  You do not feel better in 24 hours, or you are getting worse. SEEK IMMEDIATE MEDICAL CARE IF:   Your joint becomes suddenly more tender, and you have chills or a fever. MAKE SURE YOU:   Understand these instructions.  Will watch your condition.  Will get help right away if you are not doing well or get worse.   This information is not intended to replace advice given to you by your health care provider. Make sure you discuss any questions you have with your health care provider.   Document Released: 01/05/2000 Document Revised: 01/28/2014 Document Reviewed: 08/21/2011 Elsevier Interactive Patient Education 2016 [place] Strain With Rehab A strain is an injury in which a tendon or muscle is torn. The muscles and tendons of the lower back are vulnerable to strains. However, these muscles and tendons are very strong and require a great force to be injured. Strains are classified into three categories. Grade 1 strains cause pain, but the tendon is not lengthened. Grade 2  strains include a lengthened ligament, due to the ligament being stretched or partially ruptured. With grade 2 strains there is still function, although the function may be decreased. Grade 3 strains involve a complete tear of the tendon or muscle, and function is usually impaired. SYMPTOMS   Pain in the lower back.  Pain that affects one side more than the other.  Pain that gets worse with movement and may be felt in the hip, buttocks, or back of the thigh.  Muscle spasms of the muscles in the back.  Swelling along the muscles of the back.  Loss of strength of the back muscles.  Crackling sound (crepitation) when the muscles are touched. CAUSES  Lower back strains occur when a force is placed on the muscles or tendons that is greater than they can handle. Common causes of injury include:  Prolonged overuse of the  muscle-tendon units in the lower back, usually from incorrect posture.  A single violent injury or force applied to the back. RISK INCREASES WITH:  Sports that involve twisting forces on the spine or a lot of bending at the waist (football, rugby, weightlifting, bowling, golf, tennis, speed skating, racquetball, swimming, running, gymnastics, diving).  Poor strength and flexibility.  Failure to warm up properly before activity.  Family history of lower back pain or disk disorders.  Previous back injury or surgery (especially fusion).  Poor posture with lifting, especially heavy objects.  Prolonged sitting, especially with poor posture. PREVENTION   Learn and use proper posture when sitting or lifting (maintain proper posture when sitting, lift using the knees and legs, not at the waist).  Warm up and stretch properly before activity.  Allow for adequate recovery between workouts.  Maintain physical fitness:  Strength, flexibility, and endurance.  Cardiovascular fitness. PROGNOSIS  If treated properly, lower back strains usually heal within 6  weeks. RELATED COMPLICATIONS   Recurring symptoms, resulting in a chronic problem.  Chronic inflammation, scarring, and partial muscle-tendon tear.  Delayed healing or resolution of symptoms.  Prolonged disability. TREATMENT  Treatment first involves the use of ice and medicine, to reduce pain and inflammation. The use of strengthening and stretching exercises may help reduce pain with activity. These exercises may be performed at home or with a therapist. Severe injuries may require referral to a therapist for further evaluation and treatment, such as ultrasound. Your caregiver may advise that you wear a back brace or corset, to help reduce pain and discomfort. Often, prolonged bed rest results in greater harm then benefit. Corticosteroid injections may be recommended. However, these should be reserved for the most serious cases. It is important to avoid using your back when lifting objects. At night, sleep on your back on a firm mattress with a pillow placed under your knees. If non-surgical treatment is unsuccessful, surgery may be needed.  MEDICATION   If pain medicine is needed, nonsteroidal anti-inflammatory medicines (aspirin and ibuprofen), or other minor pain relievers (acetaminophen), are often advised.  Do not take pain medicine for 7 days before surgery.  Prescription pain relievers may be given, if your caregiver thinks they are needed. Use only as directed and only as much as you need.  Ointments applied to the skin may be helpful.  Corticosteroid injections may be given by your caregiver. These injections should be reserved for the most serious cases, because they may only be given a certain number of times. HEAT AND COLD  Cold treatment (icing) should be applied for 10 to 15 minutes every 2 to 3 hours for inflammation and pain, and immediately after activity that aggravates your symptoms. Use ice packs or an ice massage.  Heat treatment may be used before performing  stretching and strengthening activities prescribed by your caregiver, physical therapist, or athletic trainer. Use a heat pack or a warm water soak. SEEK MEDICAL CARE IF:   Symptoms get worse or do not improve in 2 to 4 weeks, despite treatment.  You develop numbness, weakness, or loss of bowel or bladder function.  New, unexplained symptoms develop. (Drugs used in treatment may produce side effects.) EXERCISES  RANGE OF MOTION (ROM) AND STRETCHING EXERCISES - Low Back Strain Most people with lower back pain will find that their symptoms get worse with excessive bending forward (flexion) or arching at the lower back (extension). The exercises which will help resolve your symptoms will focus on the opposite motion.  Your physician, physical therapist or athletic trainer will help you determine which exercises will be most helpful to resolve your lower back pain. Do not complete any exercises without first consulting with your caregiver. Discontinue any exercises which make your symptoms worse until you speak to your caregiver.  If you have pain, numbness or tingling which travels down into your buttocks, leg or foot, the goal of the therapy is for these symptoms to move closer to your back and eventually resolve. Sometimes, these leg symptoms will get better, but your lower back pain may worsen. This is typically an indication of progress in your rehabilitation. Be very alert to any changes in your symptoms and the activities in which you participated in the 24 hours prior to the change. Sharing this information with your caregiver will allow him/her to most efficiently treat your condition.  These exercises may help you when beginning to rehabilitate your injury. Your symptoms may resolve with or without further involvement from your physician, physical therapist or athletic trainer. While completing these exercises, remember:  Restoring tissue flexibility helps normal motion to return to the joints.  This allows healthier, less painful movement and activity.  An effective stretch should be held for at least 30 seconds.  A stretch should never be painful. You should only feel a gentle lengthening or release in the stretched tissue. FLEXION RANGE OF MOTION AND STRETCHING EXERCISES: STRETCH - Flexion, Single Knee to Chest   Lie on a firm bed or floor with both legs extended in front of you.  Keeping one leg in contact with the floor, bring your opposite knee to your chest. Hold your leg in place by either grabbing behind your thigh or at your knee.  Pull until you feel a gentle stretch in your lower back. Hold __________ seconds.  Slowly release your grasp and repeat the exercise with the opposite side. Repeat __________ times. Complete this exercise __________ times per day.  STRETCH - Flexion, Double Knee to Chest   Lie on a firm bed or floor with both legs extended in front of you.  Keeping one leg in contact with the floor, bring your opposite knee to your chest.  Tense your stomach muscles to support your back and then lift your other knee to your chest. Hold your legs in place by either grabbing behind your thighs or at your knees.  Pull both knees toward your chest until you feel a gentle stretch in your lower back. Hold __________ seconds.  Tense your stomach muscles and slowly return one leg at a time to the floor. Repeat __________ times. Complete this exercise __________ times per day.  STRETCH - Low Trunk Rotation  Lie on a firm bed or floor. Keeping your legs in front of you, bend your knees so they are both pointed toward the ceiling and your feet are flat on the floor.  Extend your arms out to the side. This will stabilize your upper body by keeping your shoulders in contact with the floor.  Gently and slowly drop both knees together to one side until you feel a gentle stretch in your lower back. Hold for __________ seconds.  Tense your stomach muscles to support  your lower back as you bring your knees back to the starting position. Repeat the exercise to the other side. Repeat __________ times. Complete this exercise __________ times per day  EXTENSION RANGE OF MOTION AND FLEXIBILITY EXERCISES: STRETCH - Extension, Prone on Elbows   Lie on your stomach on  the floor, a bed will be too soft. Place your palms about shoulder width apart and at the height of your head.  Place your elbows under your shoulders. If this is too painful, stack pillows under your chest.  Allow your body to relax so that your hips drop lower and make contact more completely with the floor.  Hold this position for __________ seconds.  Slowly return to lying flat on the floor. Repeat __________ times. Complete this exercise __________ times per day.  RANGE OF MOTION - Extension, Prone Press Ups  Lie on your stomach on the floor, a bed will be too soft. Place your palms about shoulder width apart and at the height of your head.  Keeping your back as relaxed as possible, slowly straighten your elbows while keeping your hips on the floor. You may adjust the placement of your hands to maximize your comfort. As you gain motion, your hands will come more underneath your shoulders.  Hold this position __________ seconds.  Slowly return to lying flat on the floor. Repeat __________ times. Complete this exercise __________ times per day.  RANGE OF MOTION- Quadruped, Neutral Spine   Assume a hands and knees position on a firm surface. Keep your hands under your shoulders and your knees under your hips. You may place padding under your knees for comfort.  Drop your head and point your tail bone toward the ground below you. This will round out your lower back like an angry cat. Hold this position for __________ seconds.  Slowly lift your head and release your tail bone so that your back sags into a large arch, like an old horse.  Hold this position for __________ seconds.  Repeat  this until you feel limber in your lower back.  Now, find your "sweet spot." This will be the most comfortable position somewhere between the two previous positions. This is your neutral spine. Once you have found this position, tense your stomach muscles to support your lower back.  Hold this position for __________ seconds. Repeat __________ times. Complete this exercise __________ times per day.  STRENGTHENING EXERCISES - Low Back Strain These exercises may help you when beginning to rehabilitate your injury. These exercises should be done near your "sweet spot." This is the neutral, low-back arch, somewhere between fully rounded and fully arched, that is your least painful position. When performed in this safe range of motion, these exercises can be used for people who have either a flexion or extension based injury. These exercises may resolve your symptoms with or without further involvement from your physician, physical therapist or athletic trainer. While completing these exercises, remember:   Muscles can gain both the endurance and the strength needed for everyday activities through controlled exercises.  Complete these exercises as instructed by your physician, physical therapist or athletic trainer. Increase the resistance and repetitions only as guided.  You may experience muscle soreness or fatigue, but the pain or discomfort you are trying to eliminate should never worsen during these exercises. If this pain does worsen, stop and make certain you are following the directions exactly. If the pain is still present after adjustments, discontinue the exercise until you can discuss the trouble with your caregiver. STRENGTHENING - Deep Abdominals, Pelvic Tilt  Lie on a firm bed or floor. Keeping your legs in front of you, bend your knees so they are both pointed toward the ceiling and your feet are flat on the floor.  Tense your lower abdominal muscles to press  your lower back into the  floor. This motion will rotate your pelvis so that your tail bone is scooping upwards rather than pointing at your feet or into the floor.  With a gentle tension and even breathing, hold this position for __________ seconds. Repeat __________ times. Complete this exercise __________ times per day.  STRENGTHENING - Abdominals, Crunches   Lie on a firm bed or floor. Keeping your legs in front of you, bend your knees so they are both pointed toward the ceiling and your feet are flat on the floor. Cross your arms over your chest.  Slightly tip your chin down without bending your neck.  Tense your abdominals and slowly lift your trunk high enough to just clear your shoulder blades. Lifting higher can put excessive stress on the lower back and does not further strengthen your abdominal muscles.  Control your return to the starting position. Repeat __________ times. Complete this exercise __________ times per day.  STRENGTHENING - Quadruped, Opposite UE/LE Lift   Assume a hands and knees position on a firm surface. Keep your hands under your shoulders and your knees under your hips. You may place padding under your knees for comfort.  Find your neutral spine and gently tense your abdominal muscles so that you can maintain this position. Your shoulders and hips should form a rectangle that is parallel with the floor and is not twisted.  Keeping your trunk steady, lift your right hand no higher than your shoulder and then your left leg no higher than your hip. Make sure you are not holding your breath. Hold this position __________ seconds.  Continuing to keep your abdominal muscles tense and your back steady, slowly return to your starting position. Repeat with the opposite arm and leg. Repeat __________ times. Complete this exercise __________ times per day.  STRENGTHENING - Lower Abdominals, Double Knee Lift  Lie on a firm bed or floor. Keeping your legs in front of you, bend your knees so they  are both pointed toward the ceiling and your feet are flat on the floor.  Tense your abdominal muscles to brace your lower back and slowly lift both of your knees until they come over your hips. Be certain not to hold your breath.  Hold __________ seconds. Using your abdominal muscles, return to the starting position in a slow and controlled manner. Repeat __________ times. Complete this exercise __________ times per day.  POSTURE AND BODY MECHANICS CONSIDERATIONS - Low Back Strain Keeping correct posture when sitting, standing or completing your activities will reduce the stress put on different body tissues, allowing injured tissues a chance to heal and limiting painful experiences. The following are general guidelines for improved posture. Your physician or physical therapist will provide you with any instructions specific to your needs. While reading these guidelines, remember:  The exercises prescribed by your provider will help you have the flexibility and strength to maintain correct postures.  The correct posture provides the best environment for your joints to work. All of your joints have less wear and tear when properly supported by a spine with good posture. This means you will experience a healthier, less painful body.  Correct posture must be practiced with all of your activities, especially prolonged sitting and standing. Correct posture is as important when doing repetitive low-stress activities (typing) as it is when doing a single heavy-load activity (lifting). RESTING POSITIONS Consider which positions are most painful for you when choosing a resting position. If you have pain with flexion-based  activities (sitting, bending, stooping, squatting), choose a position that allows you to rest in a less flexed posture. You would want to avoid curling into a fetal position on your side. If your pain worsens with extension-based activities (prolonged standing, working overhead), avoid  resting in an extended position such as sleeping on your stomach. Most people will find more comfort when they rest with their spine in a more neutral position, neither too rounded nor too arched. Lying on a non-sagging bed on your side with a pillow between your knees, or on your back with a pillow under your knees will often provide some relief. Keep in mind, being in any one position for a prolonged period of time, no matter how correct your posture, can still lead to stiffness. PROPER SITTING POSTURE In order to minimize stress and discomfort on your spine, you must sit with correct posture. Sitting with good posture should be effortless for a healthy body. Returning to good posture is a gradual process. Many people can work toward this most comfortably by using various supports until they have the flexibility and strength to maintain this posture on their own. When sitting with proper posture, your ears will fall over your shoulders and your shoulders will fall over your hips. You should use the back of the chair to support your upper back. Your lower back will be in a neutral position, just slightly arched. You may place a small pillow or folded towel at the base of your lower back for support.  When working at a desk, create an environment that supports good, upright posture. Without extra support, muscles tire, which leads to excessive strain on joints and other tissues. Keep these recommendations in mind: CHAIR:  A chair should be able to slide under your desk when your back makes contact with the back of the chair. This allows you to work closely.  The chair's height should allow your eyes to be level with the upper part of your monitor and your hands to be slightly lower than your elbows. BODY POSITION  Your feet should make contact with the floor. If this is not possible, use a foot rest.  Keep your ears over your shoulders. This will reduce stress on your neck and lower back. INCORRECT  SITTING POSTURES  If you are feeling tired and unable to assume a healthy sitting posture, do not slouch or slump. This puts excessive strain on your back tissues, causing more damage and pain. Healthier options include:  Using more support, like a lumbar pillow.  Switching tasks to something that requires you to be upright or walking.  Talking a brief walk.  Lying down to rest in a neutral-spine position. PROLONGED STANDING WHILE SLIGHTLY LEANING FORWARD  When completing a task that requires you to lean forward while standing in one place for a long time, place either foot up on a stationary 2-4 inch high object to help maintain the best posture. When both feet are on the ground, the lower back tends to lose its slight inward curve. If this curve flattens (or becomes too large), then the back and your other joints will experience too much stress, tire more quickly, and can cause pain. CORRECT STANDING POSTURES Proper standing posture should be assumed with all daily activities, even if they only take a few moments, like when brushing your teeth. As in sitting, your ears should fall over your shoulders and your shoulders should fall over your hips. You should keep a slight tension in your  abdominal muscles to brace your spine. Your tailbone should point down to the ground, not behind your body, resulting in an over-extended swayback posture.  INCORRECT STANDING POSTURES  Common incorrect standing postures include a forward head, locked knees and/or an excessive swayback. WALKING Walk with an upright posture. Your ears, shoulders and hips should all line-up. PROLONGED ACTIVITY IN A FLEXED POSITION When completing a task that requires you to bend forward at your waist or lean over a low surface, try to find a way to stabilize 3 out of 4 of your limbs. You can place a hand or elbow on your thigh or rest a knee on the surface you are reaching across. This will provide you more stability so that your  muscles do not fatigue as quickly. By keeping your knees relaxed, or slightly bent, you will also reduce stress across your lower back. CORRECT LIFTING TECHNIQUES DO :   Assume a wide stance. This will provide you more stability and the opportunity to get as close as possible to the object which you are lifting.  Tense your abdominals to brace your spine. Bend at the knees and hips. Keeping your back locked in a neutral-spine position, lift using your leg muscles. Lift with your legs, keeping your back straight.  Test the weight of unknown objects before attempting to lift them.  Try to keep your elbows locked down at your sides in order get the best strength from your shoulders when carrying an object.  Always ask for help when lifting heavy or awkward objects. INCORRECT LIFTING TECHNIQUES DO NOT:   Lock your knees when lifting, even if it is a small object.  Bend and twist. Pivot at your feet or move your feet when needing to change directions.  Assume that you can safely pick up even a paper clip without proper posture.   This information is not intended to replace advice given to you by your health care provider. Make sure you discuss any questions you have with your health care provider.   Document Released: 01/07/2005 Document Revised: 01/28/2014 Document Reviewed: 04/21/2008 Elsevier Interactive Patient Education [employer].

## 2014-12-27 LAB — PSA: PSA: <0.01 ng/mL (ref <=4.00)

## 2015-01-16 ENCOUNTER — Encounter: Payer: 59 | Admitting: Family Medicine

## 2015-01-18 ENCOUNTER — Telehealth: Payer: Self-pay

## 2015-01-18 NOTE — Telephone Encounter (Signed)
[name] states her husband had lab work done over 2 weeks ago and still hasn't heard from anyone. Please call [phone]

## 2015-01-19 NOTE — Telephone Encounter (Signed)
[name] notified

## 2015-05-12 ENCOUNTER — Encounter: Payer: Self-pay | Admitting: Gastroenterology

## 2015-05-21 ENCOUNTER — Ambulatory Visit (INDEPENDENT_AMBULATORY_CARE_PROVIDER_SITE_OTHER): Payer: 59 | Admitting: Family Medicine

## 2015-05-21 VITALS — BP 117/81 | HR 67 | Temp 97.6°F | Resp 16 | Ht 72.0 in | Wt 239.6 lb

## 2015-05-21 DIAGNOSIS — R21 Rash and other nonspecific skin eruption: Secondary | ICD-10-CM | POA: Diagnosis not present

## 2015-05-21 LAB — POCT SKIN KOH: Skin KOH, POC: NEGATIVE

## 2015-05-21 MED ORDER — TRIAMCINOLONE ACETONIDE 0.1 % EX CREA: 1.0000 "application " | TOPICAL_CREAM | Freq: Two times a day (BID) | CUTANEOUS | Status: DC

## 2015-05-21 NOTE — Patient Instructions (Addendum)
IF you received an x-ray today, you will receive an invoice from [facility]. Please contact [facility] at [phone] with questions or concerns regarding your invoice.   IF you received labwork today, you will receive an invoice from [employer]. Please contact [facility] at [phone] with questions or concerns regarding your invoice.   Our billing staff will not be able to assist you with questions regarding bills from these companies.  You will be contacted with the lab results as soon as they are available. The fastest way to get your results is to activate your My Chart account. Instructions are located on the last page of this paperwork. If you have not heard from [place] regarding the results in 2 weeks, please contact this office.    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  Initial treatment should be with a steroid cream, then if not improving in 1-2 weeks - add clotrimazole over the counter twice per day. Ok to continue Eucerin or Lubriderm as needed to keep hands moisturized, and if no response within the next 4 weeks, would recommend dermatology evaluation. If worsening sooner, return here or I can get you into a dermatologist sooner. Keep me posted.   Hand Dermatitis Hand dermatitis (dyshidrotic eczema) is a skin condition in which small, itchy, raised dots or fluid-filled blisters form over the palms of the hands. Outbreaks of hand dermatitis can last 3 to 4 weeks. CAUSES  The cause of hand dermatitis is unknown. However, it occurs most often in patients with a history of allergies such as:  Hay fever.  Allergic asthma.  Allergies to latex. Chemical exposure, injuries, and environmental irritants can make hand dermatitis worse. Washing your hands too frequently can remove natural oils, which can dry out the skin and contribute to outbreaks of hand  dermatitis. SYMPTOMS  The most common symptom of hand dermatitis is intense itching. Cracks or grooves (fissures) on the fingers can also develop. Affected areas can be painful, especially areas where large blisters have formed. DIAGNOSIS Your caregiver can usually tell what the problem is by doing a physical exam. PREVENTION  Avoid excessive hand washing.  Avoid the use of harsh chemicals.  Wear protective gloves when handling products that can irritate your skin. TREATMENT  Steroid creams and ointments, such as over-the-counter 1% hydrocortisone cream, can reduce inflammation and improve moisture retention. These should be applied at least 2 to 4 times per day. Your caregiver may ask you to use a stronger prescription steroid cream to help speed the healing of blistered and cracked skin. In severe cases, oral steroid medicine may be needed. If you have an infection, antibiotics may be needed. Your caregiver may also prescribe antihistamines. These medicines help reduce itching. HOME CARE INSTRUCTIONS  Only take over-the-counter or prescription medicines as directed by your caregiver.  You may use wet or cold compresses. This can help:  Alleviate itching.  Increase the effectiveness of topical creams.  Minimize blisters. SEEK MEDICAL CARE IF:  The rash is not better after 1 week of treatment.  Signs of infection develop, such as redness, tenderness, or yellowish-white fluid (pus).  The rash is spreading.   This information is not intended to replace advice given to you by your health care provider. Make sure you discuss any questions you have with your health care provider.   Document Released: 01/07/2005 Document Revised: 04/01/2011 Document Reviewed: 07/22/2014 Elsevier Interactive Patient Education  2016 [employer].

## 2015-05-21 NOTE — Progress Notes (Addendum)
Subjective:  By signing my name below, I, Adam Cunningham, attest that this documentation has been prepared under the direction and in the presence of Adam Ray, MD. Electronically Signed: Moises Cunningham, [place]. 05/21/2015 , 1:04 PM .  Patient was seen in Room 12 .   Patient ID: Adam Cunningham, male    DOB: Aug 22, 1960, 55 y.o.   MRN: [id number] Chief Complaint  Patient presents with  . Skin Problem    x 2-3 months Right hand   HPI Adam Cunningham is a 55 y.o. male Here for right hand rash. He was seen for a physical on 12/26/14. At that visit, he did note some peeling and scaling of the right palmar hand and a small area on the right foot. Thought to be dyshidrotic eczema. Advised to use Eucerin and OTC cortisone if needed. Appeared to be healing at that time.   Patient states there are small blisters that have popped up, and would itch after the blisters open. He's been using lotion to keep his hands moisturizer. His rash gradually became worse after he stopped taking the antifungal medication. He denies using a steroid cream. He denies using latex gloves.   He takes flexeril for muscle strain and low back pain. He's only taken a couple a month.   Patient Active Problem List   Diagnosis Date Noted  . Hyperlipidemia 11/26/2011  . Gout 11/26/2011  . Low testosterone 11/26/2011  . H/O prostate cancer 05/26/2007   Past Medical History  Diagnosis Date  . Cancer Adam Cunningham)     prostate   Past Surgical History  Procedure Laterality Date  . Prostate surgery    . Biceps tendon repair     No Known Allergies Prior to Admission medications   Medication Sig Start Date End Date Taking? Authorizing Provider  allopurinol (ZYLOPRIM) 100 MG tablet Take 1 tablet (100 mg total) by mouth 2 (two) times daily. 12/26/14   [name], MD  colchicine 0.6 MG tablet Take 1 tablet (0.6 mg total) by mouth daily. 12/27/13   Darlyne Russian, MD  cyclobenzaprine (FLEXERIL) 5 MG tablet 1 pill by  mouth up to every 8 hours as needed. Start with one pill by mouth each bedtime as needed due to sedation 12/26/14   [name], MD  erythromycin [facility] Cunningham) ophthalmic ointment Place 1 application into both eyes at bedtime. 12/14/13   Darlyne Russian, MD  HYDROcodone-acetaminophen (NORCO) 5-325 MG tablet Take 1 tablet by mouth every 6 (six) hours as needed for moderate pain. 12/26/14   [name], MD  indomethacin (INDOCIN) 50 MG capsule Take 1 capsule (50 mg total) by mouth 2 (two) times daily with a meal. 12/27/13   Darlyne Russian, MD  mometasone (NASONEX) 50 MCG/ACT nasal spray Place 2 sprays into the nose daily. 04/29/14   Darlyne Russian, MD  zoster vaccine live, PF, (ZOSTAVAX) 60454 UNT/0.65ML injection Inject 19,400 Units into the skin once. Patient not taking: Reported on 12/14/2013 12/08/12   Darlyne Russian, MD   Social History   Social History  . Marital Status: Married    Spouse Name: N/A  . Number of Children: N/A  . Years of Education: N/A   Occupational History  . 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 Smoking status: Never Smoker   . Smokeless tobacco: Not on file  . Alcohol Use: No  . Drug Use: No  . Sexual Activity: Yes  Other Topics Concern  . 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   Social research officer, government   Education: [employer]   Exercise: Yes   Review of Systems  Constitutional: Negative for fever, chills and fatigue.  Gastrointestinal: Negative for nausea and vomiting.  Musculoskeletal: Positive for myalgias and back pain. Negative for arthralgias and neck pain.  Skin: Positive for rash. Negative for wound.  Neurological: Negative for dizziness, weakness and headaches.       Objective:   Physical Exam  Constitutional: He is oriented to person, place, and time. He appears well-developed and well-nourished. No distress.  HENT:  Head: Normocephalic and atraumatic.  Eyes: EOM are normal. Pupils are equal,  round, and reactive to light.  Neck: Neck supple.  Cardiovascular: Normal rate.   Pulmonary/Chest: Effort normal. No respiratory distress.  Musculoskeletal: Normal range of motion.  Neurological: He is alert and oriented to person, place, and time.  Skin: Skin is warm and dry.  Left hand: radial aspect 5th finger, there are a few excoriated small papules, multiple scattered papules on the ulnar aspect of the 2nd phalanx; otherwise normal of the left hand  Right hand, more involved on the palmar: rash with excoriation and flaking, dry papules, sparing the thenar eminence, as well as the pads of fingers and thumb, few scattered interdigital papules between the 2nd/3rd and 4th/5th fingers, no dorsal hand lesions, no arms lesions  Psychiatric: He has a normal mood and affect. His behavior is normal.  Nursing note and vitals reviewed.   Filed Vitals:   05/21/15 1126  BP: 117/81  Pulse: 67  Temp: 97.6 F (36.4 C)  TempSrc: Oral  Resp: 16  Height: 6' (1.829 m)  Weight: 239 lb 9.6 oz (108.682 kg)  SpO2: 97%   Results for orders placed or performed in visit on 05/21/15  POCT Skin KOH  Result Value Ref Range   Skin KOH, POC Negative        Assessment & Plan:   Adam Cunningham is a 55 y.o. 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  -trial of triamcinolone 0.1% twice a day, Eucerin lotion for 2 weeks, then if not improving - can try adding clotrimazole BID. If not improving in next 4 weeks, consider either derm eval or higher potency steroid if only slight improvement. rtc sooner if worse.   Meds ordered this encounter  Medications  . triamcinolone cream (KENALOG) 0.1 %    Sig: Apply 1 application topically 2 (two) times daily.    Dispense:  30 g    Refill:  0   Patient Instructions       IF you received an x-Cunningham today, you will receive an invoice from [facility]. Please  contact [facility] at [phone] with questions or concerns regarding your invoice.   IF you received labwork today, you will receive an invoice from [employer]. Please contact [facility] at [phone] with questions or concerns regarding your invoice.   Our billing staff will not be able to assist you with questions regarding bills from these companies.  You will be contacted with the lab results as soon as they are available. The fastest way to get your results is to activate your My Chart account. Instructions are located on the last page of this paperwork. If you have not heard from [place] regarding the results in 2 weeks,  please contact this office.    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  Initial treatment should be with a steroid cream, then if not improving in 1-2 weeks - add clotrimazole over the counter twice per day. Ok to continue Eucerin or Lubriderm as needed to keep hands moisturized, and if no response within the next 4 weeks, would recommend dermatology evaluation. If worsening sooner, return here or I can get you into a dermatologist sooner. Keep me posted.   Hand Dermatitis Hand dermatitis (dyshidrotic eczema) is a skin condition in which small, itchy, raised dots or fluid-filled blisters form over the palms of the hands. Outbreaks of hand dermatitis can last 3 to 4 weeks. CAUSES  The cause of hand dermatitis is unknown. However, it occurs most often in patients with a history of allergies such as:  Hay fever.  Allergic asthma.  Allergies to latex. Chemical exposure, injuries, and environmental irritants can make hand dermatitis worse. Washing your hands too frequently can remove natural oils, which can dry out the skin and contribute to outbreaks of hand dermatitis. SYMPTOMS  The most common symptom of hand dermatitis is intense itching. Cracks or  grooves (fissures) on the fingers can also develop. Affected areas can be painful, especially areas where large blisters have formed. DIAGNOSIS Your caregiver can usually tell what the problem is by doing a physical exam. PREVENTION  Avoid excessive hand washing.  Avoid the use of harsh chemicals.  Wear protective gloves when handling products that can irritate your skin. TREATMENT  Steroid creams and ointments, such as over-the-counter 1% hydrocortisone cream, can reduce inflammation and improve moisture retention. These should be applied at least 2 to 4 times per day. Your caregiver may ask you to use a stronger prescription steroid cream to help speed the healing of blistered and cracked skin. In severe cases, oral steroid medicine may be needed. If you have an infection, antibiotics may be needed. Your caregiver may also prescribe antihistamines. These medicines help reduce itching. HOME CARE INSTRUCTIONS  Only take over-the-counter or prescription medicines as directed by your caregiver.  You may use wet or cold compresses. This can help:  Alleviate itching.  Increase the effectiveness of topical creams.  Minimize blisters. SEEK MEDICAL CARE IF:  The rash is not better after 1 week of treatment.  Signs of infection develop, such as redness, tenderness, or yellowish-white fluid (pus).  The rash is spreading.   This information is not intended to replace advice given to you by your health care provider. Make sure you discuss any questions you have with your health care provider.   Document Released: 01/07/2005 Document Revised: 04/01/2011 Document Reviewed: 07/22/2014 Elsevier Interactive Patient Education [employer].       I personally performed the services described in this documentation, which was scribed in my presence. The recorded information has been reviewed and considered, and addended by me as needed.

## 2015-05-22 ENCOUNTER — Other Ambulatory Visit: Payer: Self-pay | Admitting: Family Medicine

## 2015-05-22 MED FILL — TRIAMCINOLONE 0.1% CREAM: 0.1 | 14 days supply | Qty: 30 | Fill #0

## 2015-05-23 NOTE — Telephone Encounter (Signed)
Patient wanting to know if could change rx to more than #15? Please advise on this and refill

## 2015-05-24 MED FILL — CYCLOBENZAPRINE 5 MG TABLET: 5 | 5 days supply | Qty: 15 | Fill #0

## 2015-05-29 NOTE — Telephone Encounter (Signed)
Dr Carlota Raspberry, pt is requesting more than #15 on each RF is possible. I will pend Rx again for review.

## 2015-05-29 NOTE — Addendum Note (Signed)
Addended by: Elwyn Reach A on: 05/29/2015 04:06 PM   Modules accepted: Orders

## 2015-05-31 MED ORDER — CYCLOBENZAPRINE HCL 5 MG PO TABS: ORAL_TABLET | ORAL | Status: DC

## 2015-06-17 ENCOUNTER — Other Ambulatory Visit: Payer: Self-pay | Admitting: Family Medicine

## 2015-06-17 MED ORDER — TRIAMCINOLONE ACETONIDE 0.1 % EX CREA: 1.0000 "application " | TOPICAL_CREAM | Freq: Two times a day (BID) | CUTANEOUS | Status: DC

## 2015-06-17 NOTE — Progress Notes (Signed)
Rash of hands improved with triamcinolone, will refill for another course.

## 2015-06-20 MED FILL — TRIAMCINOLONE 0.1% CREAM: 0.1 | 20 days supply | Qty: 45 | Fill #0

## 2015-09-30 ENCOUNTER — Other Ambulatory Visit: Payer: Self-pay | Admitting: Family Medicine

## 2015-09-30 MED ORDER — COLCHICINE 0.6 MG PO TABS: 0.6000 mg | ORAL_TABLET | Freq: Every day | ORAL | 1 refills | Status: DC

## 2015-09-30 MED ORDER — CYCLOBENZAPRINE HCL 5 MG PO TABS: ORAL_TABLET | ORAL | 1 refills | Status: DC

## 2015-09-30 NOTE — Progress Notes (Signed)
Colchicine and flexeril refilled. See prior notes. rtc precautions given in regards to back pain and can plan on repeat uric acid next ov.

## 2015-10-17 MED FILL — CYCLOBENZAPRINE 5 MG TABLET: 5 | 10 days supply | Qty: 30 | Fill #0

## 2015-11-24 ENCOUNTER — Telehealth: Payer: Self-pay

## 2015-11-24 NOTE — Telephone Encounter (Signed)
[employer] sent [place] a letter pertaining to Pt Asst for pt's medication. Letter stated that it had evidently received our part of the application and it was missing the pt section and financial info. I mailed the letter/form to pt with a note to complete and send his part in if he is still trying to get asst.

## 2015-12-27 ENCOUNTER — Ambulatory Visit (INDEPENDENT_AMBULATORY_CARE_PROVIDER_SITE_OTHER): Payer: 59 | Admitting: Family Medicine

## 2015-12-27 VITALS — BP 124/78 | HR 71 | Temp 98.2°F | Resp 17 | Ht 72.5 in | Wt 242.0 lb

## 2015-12-27 DIAGNOSIS — Z131 Encounter for screening for diabetes mellitus: Secondary | ICD-10-CM | POA: Diagnosis not present

## 2015-12-27 DIAGNOSIS — M545 Low back pain, unspecified: Secondary | ICD-10-CM

## 2015-12-27 DIAGNOSIS — Z0000 Encounter for general adult medical examination without abnormal findings: Secondary | ICD-10-CM

## 2015-12-27 DIAGNOSIS — Z8546 Personal history of malignant neoplasm of prostate: Secondary | ICD-10-CM | POA: Diagnosis not present

## 2015-12-27 DIAGNOSIS — E7800 Pure hypercholesterolemia, unspecified: Secondary | ICD-10-CM

## 2015-12-27 DIAGNOSIS — Z23 Encounter for immunization: Secondary | ICD-10-CM | POA: Diagnosis not present

## 2015-12-27 DIAGNOSIS — M109 Gout, unspecified: Secondary | ICD-10-CM | POA: Diagnosis not present

## 2015-12-27 DIAGNOSIS — M7542 Impingement syndrome of left shoulder: Secondary | ICD-10-CM

## 2015-12-27 DIAGNOSIS — M25512 Pain in left shoulder: Secondary | ICD-10-CM

## 2015-12-27 DIAGNOSIS — J309 Allergic rhinitis, unspecified: Secondary | ICD-10-CM

## 2015-12-27 MED ORDER — HYDROCODONE-ACETAMINOPHEN 5-325 MG PO TABS: 1.0000 | ORAL_TABLET | Freq: Four times a day (QID) | ORAL | 0 refills | Status: DC | PRN

## 2015-12-27 MED ORDER — MELOXICAM 7.5 MG PO TABS: 7.5000 mg | ORAL_TABLET | Freq: Every day | ORAL | 0 refills | Status: DC

## 2015-12-27 MED ORDER — MOMETASONE FUROATE 50 MCG/ACT NA SUSP: 2.0000 | Freq: Every day | NASAL | 11 refills | Status: DC

## 2015-12-27 MED FILL — HYDROCODON-APAP 5-325: 5-325 | 5 days supply | Qty: 20 | Fill #0

## 2015-12-27 MED FILL — MELOXICAM 7.5 MG TABLET: 7.5 | 30 days supply | Qty: 30 | Fill #0

## 2015-12-27 NOTE — Patient Instructions (Addendum)
For hands - continue Eucerin, steroid cream as needed. If worsens, may need stronger steroid cream. Let me know.   For gout - depending on uric acid, and frequency of flairs, may need to increase to 200mg  allopurinol, but can wait on increase for now.   For shoulder - see info below. It appears this is due to impingement/rotator cuff tendinitis.  Try mobic once per day for now, avoid over-the-counter NSAIDs or the indomethacin while you're taking the meloxicam. If not improving the next few weeks, return for possible injection.   Shoulder Impingement Syndrome Rehab Ask your health care provider which exercises are safe for you. Do exercises exactly as told by your health care provider and adjust them as directed. It is normal to feel mild stretching, pulling, tightness, or discomfort as you do these exercises, but you should stop right away if you feel sudden pain or your pain gets worse.Do not begin these exercises until told by your health care provider. Stretching and range of motion exercise This exercise warms up your muscles and joints and improves the movement and flexibility of your shoulder. This exercise also helps to relieve pain and stiffness. Exercise A: Passive horizontal adduction 1. Sit or stand and pull your left / right elbow across your chest, toward your other shoulder. Stop when you feel a gentle stretch in the back of your shoulder and upper arm.  Keep your arm at shoulder height.  Keep your arm as close to your body as you comfortably can. 2. Hold for __________ seconds. 3. Slowly return to the starting position. Repeat __________ times. Complete this exercise __________ times a day. Strengthening exercises These exercises build strength and endurance in your shoulder. Endurance is the ability to use your muscles for a long time, even after they get tired. Exercise B: External rotation, isometric 1. Stand or sit in a doorway, facing the door frame. 2. Bend your left  / right elbow and place the back of your wrist against the door frame. Only your wrist should be touching the frame. Keep your upper arm at your side. 3. Gently press your wrist against the door frame, as if you are trying to push your arm away from your abdomen.  Avoid shrugging your shoulder while you press your hand against the door frame. Keep your shoulder blade tucked down toward the middle of your back. 4. Hold for __________ seconds. 5. Slowly release the tension, and relax your muscles completely before you do the exercise again. Repeat __________ times. Complete this exercise __________ times a day. Exercise C: Internal rotation, isometric 1. Stand or sit in a doorway, facing the door frame. 2. Bend your left / right elbow and place the inside of your wrist against the door frame. Only your wrist should be touching the frame. Keep your upper arm at your side. 3. Gently press your wrist against the door frame, as if you are trying to push your arm toward your abdomen.  Avoid shrugging your shoulder while you press your hand against the door frame. Keep your shoulder blade tucked down toward the middle of your back. 4. Hold for __________ seconds. 5. Slowly release the tension, and relax your muscles completely before you do the exercise again. Repeat __________ times. Complete this exercise __________ times a day. Exercise D: Scapular protraction, supine 1. Lie on your back on a firm surface. Hold a __________ weight in your left / right hand. 2. Raise your left / right arm straight into the air  so your hand is directly above your shoulder joint. 3. Push the weight into the air so your shoulder lifts off of the surface that you are lying on. Do not move your head, neck, or back. 4. Hold for __________ seconds. 5. Slowly return to the starting position. Let your muscles relax completely before you repeat this exercise. Repeat __________ times. Complete this exercise __________ times a  day. Exercise E: Scapular retraction 1. Sit in a stable chair without armrests, or stand. 2. Secure an exercise band to a stable object in front of you so the band is at shoulder height. 3. Hold one end of the exercise band in each hand. Your palms should face down. 4. Squeeze your shoulder blades together and move your elbows slightly behind you. Do not shrug your shoulders while you do this. 5. Hold for __________ seconds. 6. Slowly return to the starting position. Repeat __________ times. Complete this exercise __________ times a day. Exercise F: Shoulder extension 1. Sit in a stable chair without armrests, or stand. 2. Secure an exercise band to a stable object in front of you where the band is above shoulder height. 3. Hold one end of the exercise band in each hand. 4. Straighten your elbows and lift your hands up to shoulder height. 5. Squeeze your shoulder blades together and pull your hands down to the sides of your thighs. Stop when your hands are straight down by your sides. Do not let your hands go behind your body. 6. Hold for __________ seconds. 7. Slowly return to the starting position. Repeat __________ times. Complete this exercise __________ times a day. This information is not intended to replace advice given to you by your health care provider. Make sure you discuss any questions you have with your health care provider. Document Released: 01/07/2005 Document Revised: 09/14/2015 Document Reviewed: 12/10/2014 Elsevier Interactive Patient Education  2017 [employer]  Get these tests  Blood pressure- Have your blood pressure checked once a year by your healthcare provider.  Normal blood pressure is 120/80  Weight- Have your body mass index (BMI) calculated to screen for obesity.  BMI is a measure of body fat based on height and weight. You can also calculate your own BMI at [email or web address].  Cholesterol- Have your cholesterol checked  every year.  Diabetes- Have your blood sugar checked regularly if you have high blood pressure, high cholesterol, have a family history of diabetes or if you are overweight.  Screening for Colon Cancer- Colonoscopy starting at age 39.  Screening may begin sooner depending on your family history and other health conditions. Follow up colonoscopy as directed by your Gastroenterologist.  Screening for Prostate Cancer- Both blood work (PSA) and a rectal exam help screen for Prostate Cancer.  Screening begins at age 58 with African-American men and at age 4 with Caucasian men.  Screening may begin sooner depending on your family history.  Take these medicines  Aspirin- One aspirin daily can help prevent Heart disease and Stroke.  Flu shot- Every fall.  Tetanus- Every 10 years.  Zostavax- Once after the age of 26 to prevent Shingles.  Pneumonia shot- Once after the age of 65; if you are younger than 61, ask your healthcare provider if you need a Pneumonia shot.  Take these steps  Don't smoke- If you do smoke, talk to your doctor about quitting.  For tips on how to quit, go to www.smokefree.gov or call 1-800-QUIT-NOW.  Be physically active- Exercise  5 days a week for at least 30 minutes.  If you are not already physically active start slow and gradually work up to 30 minutes of moderate physical activity.  Examples of moderate activity include walking briskly, mowing the yard, dancing, swimming, bicycling, etc.  Eat a healthy diet- Eat a variety of healthy food such as fruits, vegetables, low fat milk, low fat cheese, yogurt, lean meant, poultry, fish, beans, tofu, etc. For more information go to www.thenutritionsource.org  Drink alcohol in moderation- Limit alcohol intake to less than two drinks a day. Never drink and drive.  Dentist- Brush and floss twice daily; visit your dentist twice a year.  Depression- Your emotional health is as important as your physical health. If you're feeling  down, or losing interest in things you would normally enjoy please talk to your healthcare provider.  Eye exam- Visit your eye doctor every year.  Safe sex- If you may be exposed to a sexually transmitted infection, use a condom.  Seat belts- Seat belts can save your life; always wear one.  Smoke/Carbon Monoxide detectors- These detectors need to be installed on the appropriate level of your home.  Replace batteries at least once a year.  Skin cancer- When out in the sun, cover up and use sunscreen 15 SPF or higher.  Violence- If anyone is threatening you, please tell your healthcare provider.  Living Will/ Health care power of attorney- Speak with your healthcare provider and family.   IF you received an x-ray today, you will receive an invoice from [facility]. Please contact [facility] at [phone] with questions or concerns regarding your invoice.   IF you received labwork today, you will receive an invoice from [employer]. Please contact [facility] at [phone] with questions or concerns regarding your invoice.   Our billing staff will not be able to assist you with questions regarding bills from these companies.  You will be contacted with the lab results as soon as they are available. The fastest way to get your results is to activate your My Chart account. Instructions are located on the last page of this paperwork. If you have not heard from [place] regarding the results in 2 weeks, please contact this office.

## 2015-12-27 NOTE — Progress Notes (Signed)
By signing my name below I, Adam Cunningham, attest that this documentation has been prepared under the direction and in the presence of Adam Agreste, MD. Electonically Signed. Adam Cunningham, Scribe 12/27/2015 at 10:31 AM   Subjective:    Patient ID: Adam Cunningham, male    DOB: [birth year], 55 y.o.   MRN: [id number]  Chief Complaint  Patient presents with  . Annual Exam    HPI CHANCELOR Cunningham is a 55 y.o. male who presents to the [facility] for his annual physical exam. Pt also c/o left shoulder soreness. Denies any fall, trauma, or injury. 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 Pain has been constant since onset a month ago. Denies weakness or numbness in left arm. States he has mild pain with lifting his left arm. Pt tried taking his indocin for the left shoulder pain with mild relief. Pt is right handed.  HLD Lab Results  Component Value Date   CHOL 177 12/26/2014   HDL 40 12/26/2014   LDLCALC 104 12/26/2014   TRIG 167 (H) 12/26/2014   CHOLHDL 4.4 12/26/2014   Lab Results  Component Value Date   ALT 17 12/26/2014   AST 17 12/26/2014   ALKPHOS 48 12/26/2014   BILITOT 0.9 12/26/2014   Episodic low back pain Treated with episodic flexeril and indocin. Pt denies any radiation of pain, numbness, or weakness in the lower extremities. Pt denies any urinary or bowel incontinence.   Gout Lab Results  Component Value Date   LABURIC 7.7 12/26/2014   At last visit pt was having 2-3 flare ups a year. Pt was using hydrocodone and colchicine as needed for flare ups. Discussed increasing allopurinol to 200mg  at last visit to decrease flare ups. Pt states today that he is still only taking 100mg  allopurinol a day. Pt still reports that he is having 3-5 flare ups in the past year   History of Prostate cancer Pt has a history of prostate cancer with radical prostatectomy in  2009 by Dr. Alinda Cunningham. Most recently followed by Dr. Karsten Cunningham. Pt is getting yearly PSAs.   Lab Results  Component Value Date   PSA <0.01 12/26/2014   PSA <0.01 12/14/2013   PSA <0.01 12/08/2012    Colon cancer screening Pt's last colonoscopy was in 2010 and showed mild diverticulosis and planned for repeat in 10 years. Pt currently denies any dark tarry stool or blood in stool.   Immunizations  Immunization History  Administered Date(s) Administered  . Influenza Split 12/09/2011  . Influenza,inj,Quad PF,36+ Mos 12/08/2012, 12/14/2013  . Influenza-Unspecified 11/22/2014  . Tdap 10/25/2010  Pt states he would like to have his flu shot today.   Depression screening Depression screen [facility] 2/9 05/21/2015 12/26/2014 12/14/2013 09/14/2013  Decreased Interest 0 0 0 0  Down, Depressed, Hopeless 0 0 0 0  PHQ - 2 Score 0 0 0 0   Vision screening  Visual Acuity Screening   Right eye Left eye Both eyes  Without correction:     With correction: 20/20 20/20 20/20   Pt has not seen his eye doctor this year and states he is planning to make an appointment soon.  Dentist Pt reports that he saw his dentist for a cleaning this week.  Exercise Pt states he is getting 150 minutes of exercise a week.  Wt Readings from Last 3 Encounters:  12/27/15 242 lb (  109.8 kg)  05/21/15 239 lb 9.6 oz (108.7 kg)  12/26/14 235 lb (106.6 kg)      Patient Active Problem List   Diagnosis Date Noted  . Hyperlipidemia 11/26/2011  . Gout 11/26/2011  . Low testosterone 11/26/2011  . H/O prostate cancer 05/26/2007   Past Medical History:  Diagnosis Date  . Cancer [facility])    prostate   Past Surgical History:  Procedure Laterality Date  . BICEPS TENDON REPAIR    . PROSTATE SURGERY     No Known Allergies Prior to Admission medications   Medication Sig Start Date End Date Taking? Authorizing Provider  allopurinol (ZYLOPRIM) 100 MG tablet Take 1 tablet (100 mg total) by mouth 2 (two) times daily. 12/26/14  Yes  Adam Agreste, MD  colchicine 0.6 MG tablet Take 1 tablet (0.6 mg total) by mouth daily. 09/30/15  Yes Adam Agreste, MD  cyclobenzaprine (FLEXERIL) 5 MG tablet TAKE 1 TABLET BY MOUTH UP TO EVERY 8 HOURS AS NEEDED. START WITH 1 TABLET EACH NIGHT AT BEDTIME AS NEEDED DUE TO SEDATION 09/30/15  Yes Adam Agreste, MD  erythromycin [facility]) ophthalmic ointment Place 1 application into both eyes at bedtime. 12/14/13  Yes Darlyne Russian, MD  HYDROcodone-acetaminophen (NORCO) 5-325 MG tablet Take 1 tablet by mouth every 6 (six) hours as needed for moderate pain. 12/26/14  Yes Adam Agreste, MD  indomethacin (INDOCIN) 50 MG capsule Take 1 capsule (50 mg total) by mouth 2 (two) times daily with a meal. 12/27/13  Yes Darlyne Russian, MD  mometasone (NASONEX) 50 MCG/ACT nasal spray Place 2 sprays into the nose daily. 04/29/14  Yes Darlyne Russian, MD  triamcinolone cream (KENALOG) 0.1 % Apply 1 application topically 2 (two) times daily. 06/17/15  Yes Adam Agreste, MD  zoster vaccine live, PF, (ZOSTAVAX) 16109 UNT/0.65ML injection Inject 19,400 Units into the skin once. 12/08/12  Yes Darlyne Russian, MD   Social History   Social History  . Marital status: Married    Spouse name: N/A  . Number of children: N/A  . Years of education: N/A   Occupational History  . 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 Smoking status: Never Smoker  . Smokeless tobacco: Not on file  . Alcohol use No  . Drug use: No  . Sexual activity: Yes   Other Topics Concern  . 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   Social research officer, government   Education: [employer]   Exercise: Yes     Review of Systems13 point ROS negative with exception of left shoulder pain.      Objective:   Physical Exam  Constitutional: He is oriented to person, place, and time. He appears well-developed and well-nourished.  HENT:  Head: Normocephalic and atraumatic.  Right Ear: External ear normal.    Left Ear: External ear normal.  Mouth/Throat: Oropharynx is clear and moist.  Eyes: Conjunctivae and EOM are normal. Pupils are equal, round, and reactive to light.  Neck: Normal range of motion and full passive range of motion without pain. Neck supple. No JVD present. No spinous process tenderness and no muscular tenderness present. Carotid bruit is not present. Normal range of motion present. No thyromegaly present.  Cardiovascular: Normal rate, regular rhythm, normal heart sounds and intact distal pulses.   No murmur heard. Pulmonary/Chest: Effort normal and breath sounds normal. No respiratory distress. He has  no wheezes. He has no rales.  Abdominal: Soft. He exhibits no distension. There is no tenderness.  Musculoskeletal: Normal range of motion. He exhibits no edema or tenderness.  C-spine, paracervical muscles non tender. Pt's pain is not reproduced with any movement. Pt has full ROM of C-spine. Left shoulder: [place], AC, and clavicle are non tender. Positive neer and positive hawkins in left shoulder. Left shoulder has Full ROM and full RTC strength. NVI distally.  Lymphadenopathy:    He has no cervical adenopathy.  Neurological: He is alert and oriented to person, place, and time. He has normal reflexes.  Skin: Skin is warm and dry.  Dry spots with scabs on palmar hands bases, minimal, dorsal hands uninvolved.   Psychiatric: He has a normal mood and affect. His behavior is normal.  Vitals reviewed.   Vitals:   12/27/15 0845  BP: 124/78  Pulse: 71  Resp: 17  Temp: 98.2 F (36.8 C)  TempSrc: Oral  SpO2: 97%  Weight: 242 lb (109.8 kg)  Height: 6' 0.5" (1.842 m)         Assessment & Plan:    KRISS DIFRANCO is a 55 y.o. male Annual physical exam  - -anticipatory guidance as below in AVS, screening labs above. Health maintenance items as above in HPI discussed/recommended as applicable.   Gouty arthritis - Plan: Uric Acid, HYDROcodone-acetaminophen (NORCO) 5-325 MG  tablet  - Overall stable. Check uric acid, no change in regimen for now. Hydrocodone if needed for breakthrough pain during acute gout flare. Start with colchicine, and advised colchicine OR Indocin typically should be sufficient on their own (should not need both).  Continue allopurinol 100 mg daily at this time depending on uric acid results and frequency of flares.  Left shoulder pain, unspecified chronicity - Plan: mometasone (NASONEX) 50 MCG/ACT nasal spray, meloxicam (MOBIC) 7.5 MG tablet Impingement syndrome of left shoulder - Plan: meloxicam (MOBIC) 7.5 MG tablet  - Likely impingement syndrome/rotator cuff tendinosis. Handout given with home exercises, consider injection if not improved with HEP and trial of meloxicam.  Low back pain without sciatica, unspecified back pain laterality, unspecified chronicity  -Episodic, has Flexeril if needed.  Allergic rhinitis, unspecified chronicity, unspecified seasonality, unspecified trigger - Plan: mometasone (NASONEX) 50 MCG/ACT nasal spray  -Stable, Continue Nasonex nasal spray, prescription provided.  Needs flu shot - Plan: Flu Vaccine QUAD 36+ mos IM given  Pure hypercholesterolemia - Plan: Comprehensive metabolic panel, Lipid panel  -Check lipids, cmp to determine if medication needed  History of prostate cancer - Plan: PSA  -PSA for routine monitoring.  Screening for diabetes mellitus - Plan: Comprehensive metabolic panel   Meds ordered this encounter  Medications  . HYDROcodone-acetaminophen (NORCO) 5-325 MG tablet    Sig: Take 1 tablet by mouth every 6 (six) hours as needed for moderate pain.    Dispense:  20 tablet    Refill:  0  . mometasone (NASONEX) 50 MCG/ACT nasal spray    Sig: Place 2 sprays into the nose daily.    Dispense:  17 g    Refill:  11  . meloxicam (MOBIC) 7.5 MG tablet    Sig: Take 1 tablet (7.5 mg total) by mouth daily.    Dispense:  30 tablet    Refill:  0   Patient Instructions    For hands -  continue Eucerin, steroid cream as needed. If worsens, may need stronger steroid cream. Let me know.   For gout - depending on uric acid, and  frequency of flairs, may need to increase to 200mg  allopurinol, but can wait on increase for now.   For shoulder - see info below. It appears this is due to impingement/rotator cuff tendinitis.  Try mobic once per day for now, avoid over-the-counter NSAIDs or the indomethacin while you're taking the meloxicam. If not improving the next few weeks, return for possible injection.   Shoulder Impingement Syndrome Rehab Ask your health care provider which exercises are safe for you. Do exercises exactly as told by your health care provider and adjust them as directed. It is normal to feel mild stretching, pulling, tightness, or discomfort as you do these exercises, but you should stop right away if you feel sudden pain or your pain gets worse.Do not begin these exercises until told by your health care provider. Stretching and range of motion exercise This exercise warms up your muscles and joints and improves the movement and flexibility of your shoulder. This exercise also helps to relieve pain and stiffness. Exercise A: Passive horizontal adduction 1. Sit or stand and pull your left / right elbow across your chest, toward your other shoulder. Stop when you feel a gentle stretch in the back of your shoulder and upper arm.  Keep your arm at shoulder height.  Keep your arm as close to your body as you comfortably can. 2. Hold for __________ seconds. 3. Slowly return to the starting position. Repeat __________ times. Complete this exercise __________ times a day. Strengthening exercises These exercises build strength and endurance in your shoulder. Endurance is the ability to use your muscles for a long time, even after they get tired. Exercise B: External rotation, isometric 1. Stand or sit in a doorway, facing the door frame. 2. Bend your left / right elbow  and place the back of your wrist against the door frame. Only your wrist should be touching the frame. Keep your upper arm at your side. 3. Gently press your wrist against the door frame, as if you are trying to push your arm away from your abdomen.  Avoid shrugging your shoulder while you press your hand against the door frame. Keep your shoulder blade tucked down toward the middle of your back. 4. Hold for __________ seconds. 5. Slowly release the tension, and relax your muscles completely before you do the exercise again. Repeat __________ times. Complete this exercise __________ times a day. Exercise C: Internal rotation, isometric 1. Stand or sit in a doorway, facing the door frame. 2. Bend your left / right elbow and place the inside of your wrist against the door frame. Only your wrist should be touching the frame. Keep your upper arm at your side. 3. Gently press your wrist against the door frame, as if you are trying to push your arm toward your abdomen.  Avoid shrugging your shoulder while you press your hand against the door frame. Keep your shoulder blade tucked down toward the middle of your back. 4. Hold for __________ seconds. 5. Slowly release the tension, and relax your muscles completely before you do the exercise again. Repeat __________ times. Complete this exercise __________ times a day. Exercise D: Scapular protraction, supine 1. Lie on your back on a firm surface. Hold a __________ weight in your left / right hand. 2. Raise your left / right arm straight into the air so your hand is directly above your shoulder joint. 3. Push the weight into the air so your shoulder lifts off of the surface that you are lying on.  Do not move your head, neck, or back. 4. Hold for __________ seconds. 5. Slowly return to the starting position. Let your muscles relax completely before you repeat this exercise. Repeat __________ times. Complete this exercise __________ times a day. Exercise  E: Scapular retraction 1. Sit in a stable chair without armrests, or stand. 2. Secure an exercise band to a stable object in front of you so the band is at shoulder height. 3. Hold one end of the exercise band in each hand. Your palms should face down. 4. Squeeze your shoulder blades together and move your elbows slightly behind you. Do not shrug your shoulders while you do this. 5. Hold for __________ seconds. 6. Slowly return to the starting position. Repeat __________ times. Complete this exercise __________ times a day. Exercise F: Shoulder extension 1. Sit in a stable chair without armrests, or stand. 2. Secure an exercise band to a stable object in front of you where the band is above shoulder height. 3. Hold one end of the exercise band in each hand. 4. Straighten your elbows and lift your hands up to shoulder height. 5. Squeeze your shoulder blades together and pull your hands down to the sides of your thighs. Stop when your hands are straight down by your sides. Do not let your hands go behind your body. 6. Hold for __________ seconds. 7. Slowly return to the starting position. Repeat __________ times. Complete this exercise __________ times a day. This information is not intended to replace advice given to you by your health care provider. Make sure you discuss any questions you have with your health care provider. Document Released: 01/07/2005 Document Revised: 09/14/2015 Document Reviewed: 12/10/2014 Elsevier Interactive Patient Education  2017 [place] you healthy  Get these tests  Blood pressure- Have your blood pressure checked once a year by your healthcare provider.  Normal blood pressure is 120/80  Weight- Have your body mass index (BMI) calculated to screen for obesity.  BMI is a measure of body fat based on height and weight. You can also calculate your own BMI at [email or web address].[email or web address].  Cholesterol- Have your cholesterol checked every  year.  Diabetes- Have your blood sugar checked regularly if you have high blood pressure, high cholesterol, have a family history of diabetes or if you are overweight.  Screening for Colon Cancer- Colonoscopy starting at age 54.  Screening may begin sooner depending on your family history and other health conditions. Follow up colonoscopy as directed by your Gastroenterologist.  Screening for Prostate Cancer- Both blood work (PSA) and a rectal exam help screen for Prostate Cancer.  Screening begins at age 71 with African-American men and at age 36 with Caucasian men.  Screening may begin sooner depending on your family history.  Take these medicines  Aspirin- One aspirin daily can help prevent Heart disease and Stroke.  Flu shot- Every fall.  Tetanus- Every 10 years.  Zostavax- Once after the age of 78 to prevent Shingles.  Pneumonia shot- Once after the age of 59; if you are younger than 66, ask your healthcare provider if you need a Pneumonia shot.  Take these steps  Don't smoke- If you do smoke, talk to your doctor about quitting.  For tips on how to quit, go to www.smokefree.gov or call 1-800-QUIT-NOW.  Be physically active- Exercise 5 days a week for at least 30 minutes.  If you are not already physically active start slow and gradually work up to 30 minutes of moderate physical  activity.  Examples of moderate activity include walking briskly, mowing the yard, dancing, swimming, bicycling, etc.  Eat a healthy diet- Eat a variety of healthy food such as fruits, vegetables, low fat milk, low fat cheese, yogurt, lean meant, poultry, fish, beans, tofu, etc. For more information go to www.thenutritionsource.org  Drink alcohol in moderation- Limit alcohol intake to less than two drinks a day. Never drink and drive.  Dentist- Brush and floss twice daily; visit your dentist twice a year.  Depression- Your emotional health is as important as your physical health. If you're feeling down,  or losing interest in things you would normally enjoy please talk to your healthcare provider.  Eye exam- Visit your eye doctor every year.  Safe sex- If you may be exposed to a sexually transmitted infection, use a condom.  Seat belts- Seat belts can save your life; always wear one.  Smoke/Carbon Monoxide detectors- These detectors need to be installed on the appropriate level of your home.  Replace batteries at least once a year.  Skin cancer- When out in the sun, cover up and use sunscreen 15 SPF or higher.  Violence- If anyone is threatening you, please tell your healthcare provider.  Living Will/ Health care power of attorney- Speak with your healthcare provider and family.   IF you received an x-ray today, you will receive an invoice from [facility]. Please contact [facility] at [phone] with questions or concerns regarding your invoice.   IF you received labwork today, you will receive an invoice from [employer]. Please contact [facility] at [phone] with questions or concerns regarding your invoice.   Our billing staff will not be able to assist you with questions regarding bills from these companies.  You will be contacted with the lab results as soon as they are available. The fastest way to get your results is to activate your My Chart account. Instructions are located on the last page of this paperwork. If you have not heard from [place] regarding the results in 2 weeks, please contact this office.        I personally performed the services described in this documentation, which was scribed in my presence. The recorded information has been reviewed and considered, and addended by me as needed.   Signed,   Merri Ray, MD [facility].  12/29/15 1:25 PM

## 2015-12-28 LAB — COMPREHENSIVE METABOLIC PANEL
ALBUMIN: 4.6 g/dL (ref 3.5–5.5)
ALT: 23 IU/L (ref 0–44)
AST: 15 IU/L (ref 0–40)
Albumin/Globulin Ratio: 1.8 (ref 1.2–2.2)
Alkaline Phosphatase: 61 IU/L (ref 39–117)
BUN / CREAT RATIO: 18 (ref 9–20)
BUN: 15 mg/dL (ref 6–24)
Bilirubin Total: 0.5 mg/dL (ref 0.0–1.2)
CALCIUM: 9.1 mg/dL (ref 8.7–10.2)
CHLORIDE: 102 mmol/L (ref 96–106)
CO2: 25 mmol/L (ref 18–29)
CREATININE: 0.85 mg/dL (ref 0.76–1.27)
GFR, EST AFRICAN AMERICAN: 113 mL/min/{1.73_m2} (ref >59)
GFR, EST NON AFRICAN AMERICAN: 97 mL/min/{1.73_m2} (ref >59)
GLUCOSE: 101 mg/dL — AB (ref 65–99)
Globulin, Total: 2.6 g/dL (ref 1.5–4.5)
Potassium: 4.3 mmol/L (ref 3.5–5.2)
Sodium: 143 mmol/L (ref 134–144)
TOTAL PROTEIN: 7.2 g/dL (ref 6.0–8.5)

## 2015-12-28 LAB — LIPID PANEL
CHOLESTEROL TOTAL: 183 mg/dL (ref 100–199)
Chol/HDL Ratio: 4.4 ratio units (ref 0.0–5.0)
HDL: 42 mg/dL (ref >39)
LDL Calculated: 112 mg/dL — ABNORMAL HIGH (ref 0–99)
TRIGLYCERIDES: 147 mg/dL (ref 0–149)
VLDL Cholesterol Cal: 29 mg/dL (ref 5–40)

## 2015-12-28 LAB — PSA: Prostate Specific Ag, Serum: <0.1 ng/mL (ref 0.0–4.0)

## 2015-12-28 LAB — URIC ACID: Uric Acid: 7.5 mg/dL (ref 3.7–8.6)

## 2016-01-05 ENCOUNTER — Encounter: Payer: Self-pay | Admitting: *Deleted

## 2016-01-18 ENCOUNTER — Encounter: Payer: 59 | Admitting: Family Medicine

## 2016-10-25 ENCOUNTER — Other Ambulatory Visit: Payer: Self-pay | Admitting: Family Medicine

## 2016-12-06 DIAGNOSIS — H10502 Unspecified blepharoconjunctivitis, left eye: Secondary | ICD-10-CM | POA: Diagnosis not present

## 2016-12-06 MED FILL — TOBRAMYCIN-DEXAMETH OPHTH S: 0.3-0.1 | 5 days supply | Qty: 3 | Fill #0

## 2016-12-19 DIAGNOSIS — H1032 Unspecified acute conjunctivitis, left eye: Secondary | ICD-10-CM | POA: Diagnosis not present

## 2016-12-26 DIAGNOSIS — H5203 Hypermetropia, bilateral: Secondary | ICD-10-CM | POA: Diagnosis not present

## 2016-12-26 DIAGNOSIS — H524 Presbyopia: Secondary | ICD-10-CM | POA: Diagnosis not present

## 2016-12-26 DIAGNOSIS — H52203 Unspecified astigmatism, bilateral: Secondary | ICD-10-CM | POA: Diagnosis not present

## 2017-01-07 ENCOUNTER — Encounter: Payer: Self-pay | Admitting: Family Medicine

## 2017-01-07 ENCOUNTER — Other Ambulatory Visit: Payer: Self-pay

## 2017-01-07 ENCOUNTER — Ambulatory Visit (INDEPENDENT_AMBULATORY_CARE_PROVIDER_SITE_OTHER): Payer: 59 | Admitting: Family Medicine

## 2017-01-07 VITALS — BP 116/78 | HR 77 | Temp 97.8°F | Resp 16 | Ht 74.0 in | Wt 252.0 lb

## 2017-01-07 DIAGNOSIS — Z1159 Encounter for screening for other viral diseases: Secondary | ICD-10-CM

## 2017-01-07 DIAGNOSIS — M545 Low back pain, unspecified: Secondary | ICD-10-CM

## 2017-01-07 DIAGNOSIS — E785 Hyperlipidemia, unspecified: Secondary | ICD-10-CM | POA: Diagnosis not present

## 2017-01-07 DIAGNOSIS — M109 Gout, unspecified: Secondary | ICD-10-CM | POA: Diagnosis not present

## 2017-01-07 DIAGNOSIS — Z23 Encounter for immunization: Secondary | ICD-10-CM

## 2017-01-07 DIAGNOSIS — C61 Malignant neoplasm of prostate: Secondary | ICD-10-CM

## 2017-01-07 DIAGNOSIS — Z114 Encounter for screening for human immunodeficiency virus [HIV]: Secondary | ICD-10-CM | POA: Diagnosis not present

## 2017-01-07 DIAGNOSIS — Z0000 Encounter for general adult medical examination without abnormal findings: Secondary | ICD-10-CM | POA: Diagnosis not present

## 2017-01-07 DIAGNOSIS — M1A9XX0 Chronic gout, unspecified, without tophus (tophi): Secondary | ICD-10-CM

## 2017-01-07 MED ORDER — CYCLOBENZAPRINE HCL 5 MG PO TABS: ORAL_TABLET | ORAL | 1 refills | Status: DC

## 2017-01-07 MED ORDER — ALLOPURINOL 100 MG PO TABS: 100.0000 mg | ORAL_TABLET | Freq: Two times a day (BID) | ORAL | 1 refills | Status: DC

## 2017-01-07 MED FILL — ALLOPURINOL 100 MG TABLET: 100 | 90 days supply | Qty: 180 | Fill #0

## 2017-01-07 MED FILL — CYCLOBENZAPRINE 5 MG TABLET: 5 | 10 days supply | Qty: 30 | Fill #0

## 2017-01-07 NOTE — Patient Instructions (Addendum)
If uric acid is above 6 , we have option of increasing to 200 mg per day to prevent flare. Colchicine or indomethacin, with hydrocodone if needed. Let me know if flares increase.   Flexeril if needed for back pain.  If that is occurring more often, or worsening back pain, follow up to look into other treatment or workup.   If you are having shoulder issues, please return to discuss that further. Occasional indomethacin if needed temporarily only.   Weight has increased past year. Some form of exercise most days per week with minimum 150 minutes per week should help.   Thanks for coming in today.   Keeping you healthy  Get these tests  Blood pressure- Have your blood pressure checked once a year by your healthcare provider.  Normal blood pressure is 120/80  Weight- Have your body mass index (BMI) calculated to screen for obesity.  BMI is a measure of body fat based on height and weight. You can also calculate your own BMI at ViewBanking.[email or web address].  Cholesterol- Have your cholesterol checked every year.  Diabetes- Have your blood sugar checked regularly if you have high blood pressure, high cholesterol, have a family history of diabetes or if you are overweight.  Screening for Colon Cancer- Colonoscopy starting at age 22.  Screening may begin sooner depending on your family history and other health conditions. Follow up colonoscopy as directed by your Gastroenterologist.  Screening for Prostate Cancer- Both blood work (PSA) and a rectal exam help screen for Prostate Cancer.  Screening begins at age 16 with African-American men and at age 57 with Caucasian men.  Screening may begin sooner depending on your family history.  Take these medicines  Aspirin- One aspirin daily can help prevent Heart disease and Stroke.  Flu shot- Every fall.  Tetanus- Every 10 years.  Zostavax- Once after the age of 104 to prevent Shingles.  Pneumonia shot- Once after the age of 59; if you are younger  than 62, ask your healthcare provider if you need a Pneumonia shot.  Take these steps  Don't smoke- If you do smoke, talk to your doctor about quitting.  For tips on how to quit, go to www.smokefree.gov or call 1-800-QUIT-NOW.  Be physically active- Exercise 5 days a week for at least 30 minutes.  If you are not already physically active start slow and gradually work up to 30 minutes of moderate physical activity.  Examples of moderate activity include walking briskly, mowing the yard, dancing, swimming, bicycling, etc.  Eat a healthy diet- Eat a variety of healthy food such as fruits, vegetables, low fat milk, low fat cheese, yogurt, lean meant, poultry, fish, beans, tofu, etc. For more information go to www.thenutritionsource.org  Drink alcohol in moderation- Limit alcohol intake to less than two drinks a day. Never drink and drive.  Dentist- Brush and floss twice daily; visit your dentist twice a year.  Depression- Your emotional health is as important as your physical health. If you're feeling down, or losing interest in things you would normally enjoy please talk to your healthcare provider.  Eye exam- Visit your eye doctor every year.  Safe sex- If you may be exposed to a sexually transmitted infection, use a condom.  Seat belts- Seat belts can save your life; always wear one.  Smoke/Carbon Monoxide detectors- These detectors need to be installed on the appropriate level of your home.  Replace batteries at least once a year.  Skin cancer- When out in the sun, cover up  and use sunscreen 15 SPF or higher.  Violence- If anyone is threatening you, please tell your healthcare provider.  Living Will/ Health care power of attorney- Speak with your healthcare provider and family.  IF you received an x-ray today, you will receive an invoice from [facility]. Please contact [facility] at [phone] with questions or concerns regarding your invoice.   IF you received  labwork today, you will receive an invoice from [facility]. Please contact [facility] at [phone] with questions or concerns regarding your invoice.   Our billing staff will not be able to assist you with questions regarding bills from these companies.  You will be contacted with the lab results as soon as they are available. The fastest way to get your results is to activate your My Chart account. Instructions are located on the last page of this paperwork. If you have not heard from [place] regarding the results in 2 weeks, please contact this office.

## 2017-01-07 NOTE — Progress Notes (Addendum)
Subjective:  This chart was scribed for [name], MD by Tamsen Roers, at [place] at [facility].  This patient was seen in room 1 and the patient's care was started at 10:01 AM.   Chief Complaint  Patient presents with  . Annual Exam    patient presents for CPE     Patient ID: Adam Cunningham, male    DOB: 03/10/1960, 56 y.o.   MRN: [id number]   HPI HPI Comments: Adam Cunningham is a 56 y.o. male who presents to Primary Care at [facility] for a physical exam. He has a history of gout, episodic lower back pain, hyperlipidemia, history of prostate cancer with prostatectomy (in 2009 by Dr.Ottlein) and hypogonadism.    Hyperlipidemia:  Lab Results  Component Value Date   CHOL 183 12/27/2015   HDL 42 12/27/2015   LDLCALC 112 (H) 12/27/2015   TRIG 147 12/27/2015   CHOLHDL 4.4 12/27/2015   Lab Results  Component Value Date   ALT 23 12/27/2015   AST 15 12/27/2015   ALKPHOS 61 12/27/2015   BILITOT 0.5 12/27/2015  The 10-year ASCVD risk score Mikey Bussing DC Jr., et al., 2013) is: 5.9%   Values used to calculate the score:     Age: 65 years     Sex: Male     Is Non-Hispanic African American: No     Diabetic: No     Tobacco smoker: No     Systolic Blood Pressure: 852 mmHg     Is BP treated: No     HDL Cholesterol: 42 mg/dL     Total Cholesterol: 183 mg/dL Based on low overall risk, he was not started on a Statin.  Recommended diet control.    History of prostate cancer: He is followed by urology.  --- He is having his PSA monitered here and he was told to see Dr. Hulen Luster if his PSA shows any concerns. Patient denies any fatigue or difficulty with erectoins.  Lab Results  Component Value Date   PSA <0.01 12/26/2014   PSA <0.01 12/14/2013   PSA <0.01 12/08/2012    Gout: We discussed ideally getting this below 6.  Option of increase allopurinol if frequent reoccurrences.  Currently he takes 100 mg QD with colchicine as needed. He has also needed hydrocodone  rarely for gout flares. --- Patient denies any recent flares (last one about 2-3 months ago, about 1-2 flares this past year). He denies any side effects from the Allopurinol. Patient takes the Colchicine when the flare comes on. He rarely takes the hydrocodone.  Lab Results  Component Value Date   LABURIC 7.5 12/27/2015    Episodic lower back pain: Treated with rare Flexeril and indomethacin which he has also used for gout. ---- Patient has frequent back pain flare ups especially when he is cutting up wood.  He uses Flexeril a "couple times" per month.  His prescription of 20 pills lasted about 6 months.    Cancer screening:  Colon cancer screening:colconscopy in 2010, plan for repeat in 2010.  Prostate cancer screening: as above.    Immunizations: Immunization History  Administered Date(s) Administered  . Influenza Split 12/09/2011  . Influenza,inj,Quad PF,6+ Mos 12/08/2012, 12/14/2013, 12/27/2015  . Influenza-Unspecified 11/22/2014  . Tdap 10/25/2010  Patient will be receiving the flu shot today.  He has not yet had the shingles vaccination.    Depression: Depression screen [facility] 2/9 01/07/2017 05/21/2015 12/26/2014 12/14/2013 09/14/2013  Decreased Interest 0 0 0 0 0  Down,  Depressed, Hopeless 0 0 0 0 0  PHQ - 2 Score 0 0 0 0 0    Vision:  Vision Screening Comments: Had eye exam 2 weeks ago, awaiting new rx   Dentist: Patient will be seeing his dentist tomorrow for a cleaning.    Exercise: Patient cuts wood about twice per week.  He states that he walks often. He does not have access to a gym and prefers to be outside.  Body mass index is 32.35 kg/m. Wt Readings from Last 3 Encounters:  01/07/17 252 lb (114.3 kg)  12/27/15 242 lb (109.8 kg)  05/21/15 239 lb 9.6 oz (108.7 kg)    Patient Active Problem List   Diagnosis Date Noted  . Hyperlipidemia 11/26/2011  . Gout 11/26/2011  . Low testosterone 11/26/2011  . H/O prostate cancer 05/26/2007   Past Medical History:    Diagnosis Date  . Cancer [facility])    prostate   Past Surgical History:  Procedure Laterality Date  . BICEPS TENDON REPAIR    . PROSTATE SURGERY     No Known Allergies Prior to Admission medications   Medication Sig Start Date End Date Taking? Authorizing Provider  allopurinol (ZYLOPRIM) 100 MG tablet Take 1 tablet (100 mg total) by mouth 2 (two) times daily. 12/26/14  Yes [name], MD  colchicine 0.6 MG tablet Take 1 tablet (0.6 mg total) by mouth daily. 09/30/15  Yes [name], MD  cyclobenzaprine (FLEXERIL) 5 MG tablet TAKE 1 TABLET BY MOUTH UP TO EVERY 8 HOURS AS NEEDED. START WITH 1 TABLET EACH NIGHT AT BEDTIME AS NEEDED DUE TO SEDATION 09/30/15  Yes [name], MD  HYDROcodone-acetaminophen (NORCO) 5-325 MG tablet Take 1 tablet by mouth every 6 (six) hours as needed for moderate pain. 12/27/15  Yes [name], MD  indomethacin (INDOCIN) 50 MG capsule Take 1 capsule (50 mg total) by mouth 2 (two) times daily with a meal. 12/27/13  Yes Daub, Loura Back, MD  meloxicam (MOBIC) 7.5 MG tablet Take 1 tablet (7.5 mg total) by mouth daily. Patient not taking: Reported on 01/07/2017 12/27/15   [name], MD  mometasone (NASONEX) 50 MCG/ACT nasal spray Place 2 sprays into the nose daily. Patient not taking: Reported on 01/07/2017 12/27/15   [name], MD  triamcinolone cream (KENALOG) 0.1 % Apply 1 application topically 2 (two) times daily. Patient not taking: Reported on 01/07/2017 06/17/15   [name], MD  zoster vaccine live, PF, (ZOSTAVAX) 97026 UNT/0.65ML injection Inject 19,400 Units into the skin once. Patient not taking: Reported on 01/07/2017 12/08/12   Darlyne Russian, MD   Social History   Socioeconomic History  . Marital status: Married    Spouse name: Not on file  . Number of children: Not on file  . Years of education: Not on file  . Highest education level: Not on file  Social Needs  . Financial resource strain: Not on file  . Food  insecurity - worry: Not on file  . Food insecurity - inability: Not on file  . Transportation needs - medical: Not on file  . Transportation needs - non-medical: Not on file  Occupational History  . 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 Occupation: Airline pilot  Tobacco Use  . Smoking status: Never Smoker  . Smokeless tobacco: Never Used  Substance and Sexual Activity  . Alcohol use: No  . Drug use: No  . Sexual activity: Yes  Other Topics Concern  . 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   Social research officer, government   Education: [employer]   Exercise: Yes      Review of Systems  Eyes: Positive for itching and visual disturbance.  All other systems reviewed and are negative. From 13 point ROS on patient history form     Objective:   Physical Exam  Constitutional: He is oriented to person, place, and time. He appears well-developed and well-nourished.  HENT:  Head: Normocephalic and atraumatic.  Right Ear: External ear normal.  Left Ear: External ear normal.  Mouth/Throat: Oropharynx is clear and moist.  Eyes: Conjunctivae and EOM are normal. Pupils are equal, round, and reactive to light.  Neck: Normal range of motion. Neck supple. No thyromegaly present.  Cardiovascular: Normal rate, regular rhythm, normal heart sounds and intact distal pulses.  Pulmonary/Chest: Effort normal and breath sounds normal. No respiratory distress. He has no wheezes.  Abdominal: Soft. He exhibits no distension. There is no tenderness.  Musculoskeletal: Normal range of motion. He exhibits no edema or tenderness.  Lymphadenopathy:    He has no cervical adenopathy.  Neurological: He is alert and oriented to person, place, and time. He has normal reflexes.  Skin: Skin is warm and dry.  Psychiatric: He has a normal mood and affect. His behavior is normal.  Vitals reviewed.   Vitals:   01/07/17 0906  Pulse: 77  Resp: 16  Temp: 97.8 F (36.6 C)  TempSrc: Oral  SpO2: 98%  Weight:  252 lb (114.3 kg)  Height: 6\' 2"  (1.88 m)       Assessment & Plan:   Adam Cunningham is a 56 y.o. male Annual physical exam  - -anticipatory guidance as below in AVS, screening labs above. Health maintenance items as above in HPI discussed/recommended as applicable.   Need for prophylactic vaccination and inoculation against influenza - Plan: Flu Vaccine QUAD 36+ mos IM  Chronic gout without tophus, unspecified cause, unspecified site - Plan: Uric Acid Gouty arthritis - Plan: allopurinol (ZYLOPRIM) 100 MG tablet   - Consider higher dose of allopurinol if increasing flares, especially if elevated uric acid over 6.  Prostate cancer ([place]) - Plan: PSA  -Check PSA for ongoing monitoring. Consider urology eval if any changes  Hyperlipidemia, unspecified hyperlipidemia type - Plan: Comprehensive metabolic panel, Lipid panel  - Check labs, increased exercise discussed.  Low back pain, episodic - Plan: cyclobenzaprine (FLEXERIL) 5 MG tablet  -Episodic Flexeril if needed, RTC precautions if persistent/worsening pain.  Encounter for hepatitis C screening test for low risk patient - Plan: Hepatitis C antibody  Encounter for screening for HIV - Plan: HIV antibody   Meds ordered this encounter  Medications  . cyclobenzaprine (FLEXERIL) 5 MG tablet    Sig: TAKE 1 TABLET BY MOUTH UP TO EVERY 8 HOURS AS NEEDED. START WITH 1 TABLET EACH NIGHT AT BEDTIME AS NEEDED DUE TO SEDATION    Dispense:  30 tablet    Refill:  1  . allopurinol (ZYLOPRIM) 100 MG tablet    Sig: Take 1 tablet (100 mg total) by mouth 2 (two) times daily.    Dispense:  180 tablet    Refill:  1   Patient Instructions    If uric acid is above 6 , we have option of increasing to 200 mg per day to prevent flare. Colchicine or indomethacin, with hydrocodone if needed. Let me know if flares increase.   Flexeril if needed for back pain.  If that is occurring more often, or worsening back pain, follow up to look into other  treatment or workup.   If you are having shoulder issues, please return to discuss that further. Occasional indomethacin if needed temporarily only.   Weight has increased past year. Some form of exercise most days per week with minimum 150 minutes per week should help.   Thanks for coming in today.   Keeping you healthy  Get these tests  Blood pressure- Have your blood pressure checked once a year by your healthcare provider.  Normal blood pressure is 120/80  Weight- Have your body mass index (BMI) calculated to screen for obesity.  BMI is a measure of body fat based on height and weight. You can also calculate your own BMI at [email or web address].  Cholesterol- Have your cholesterol checked every year.  Diabetes- Have your blood sugar checked regularly if you have high blood pressure, high cholesterol, have a family history of diabetes or if you are overweight.  Screening for Colon Cancer- Colonoscopy starting at age 22.  Screening may begin sooner depending on your family history and other health conditions. Follow up colonoscopy as directed by your Gastroenterologist.  Screening for Prostate Cancer- Both blood work (PSA) and a rectal exam help screen for Prostate Cancer.  Screening begins at age 61 with African-American men and at age 73 with Caucasian men.  Screening may begin sooner depending on your family history.  Take these medicines  Aspirin- One aspirin daily can help prevent Heart disease and Stroke.  Flu shot- Every fall.  Tetanus- Every 10 years.  Zostavax- Once after the age of 67 to prevent Shingles.  Pneumonia shot- Once after the age of 32; if you are younger than 63, ask your healthcare provider if you need a Pneumonia shot.  Take these steps  Don't smoke- If you do smoke, talk to your doctor about quitting.  For tips on how to quit, go to www.smokefree.gov or call 1-800-QUIT-NOW.  Be physically active- Exercise 5 days a week for at least 30 minutes.   If you are not already physically active start slow and gradually work up to 30 minutes of moderate physical activity.  Examples of moderate activity include walking briskly, mowing the yard, dancing, swimming, bicycling, etc.  Eat a healthy diet- Eat a variety of healthy food such as fruits, vegetables, low fat milk, low fat cheese, yogurt, lean meant, poultry, fish, beans, tofu, etc. For more information go to www.thenutritionsource.org  Drink alcohol in moderation- Limit alcohol intake to less than two drinks a day. Never drink and drive.  Dentist- Brush and floss twice daily; visit your dentist twice a year.  Depression- Your emotional health is as important as your physical health. If you're feeling down, or losing interest in things you would normally enjoy please talk to your healthcare provider.  Eye exam- Visit your eye doctor every year.  Safe sex- If you may be exposed to a sexually transmitted infection, use a condom.  Seat belts- Seat belts can save your life; always wear one.  Smoke/Carbon Monoxide detectors- These detectors need to be installed on the appropriate level of your home.  Replace batteries at least once a year.  Skin cancer- When out in the sun, cover up and use sunscreen 15 SPF or higher.  Violence- If anyone is threatening you, please tell your healthcare provider.  Living Will/ Health care power of attorney- Speak with your healthcare provider and family.  IF you received an x-ray today,  you will receive an invoice from [facility]. Please contact [facility] at [phone] with questions or concerns regarding your invoice.   IF you received labwork today, you will receive an invoice from [facility]. Please contact [facility] at [phone] with questions or concerns regarding your invoice.   Our billing staff will not be able to assist you with questions regarding bills from these companies.  You will be contacted with the lab results as  soon as they are available. The fastest way to get your results is to activate your My Chart account. Instructions are located on the last page of this paperwork. If you have not heard from [place] regarding the results in 2 weeks, please contact this office.      I personally performed the services described in this documentation, which was scribed in my presence. The recorded information has been reviewed and considered for accuracy and completeness, addended by me as needed, and agree with information above.  Signed,   Merri Ray, MD Primary Care at [facility].  01/09/17 12:19 PM

## 2017-01-08 LAB — URIC ACID: URIC ACID: 8.4 mg/dL (ref 3.7–8.6)

## 2017-01-08 LAB — COMPREHENSIVE METABOLIC PANEL
ALBUMIN: 4.6 g/dL (ref 3.5–5.5)
ALT: 27 IU/L (ref 0–44)
AST: 21 IU/L (ref 0–40)
Albumin/Globulin Ratio: 1.8 (ref 1.2–2.2)
Alkaline Phosphatase: 63 IU/L (ref 39–117)
BUN / CREAT RATIO: 11 (ref 9–20)
BUN: 11 mg/dL (ref 6–24)
Bilirubin Total: 0.7 mg/dL (ref 0.0–1.2)
CALCIUM: 9.4 mg/dL (ref 8.7–10.2)
CO2: 27 mmol/L (ref 20–29)
CREATININE: 0.99 mg/dL (ref 0.76–1.27)
Chloride: 101 mmol/L (ref 96–106)
GFR calc Af Amer: 97 mL/min/{1.73_m2} (ref >59)
GFR, EST NON AFRICAN AMERICAN: 84 mL/min/{1.73_m2} (ref >59)
GLOBULIN, TOTAL: 2.5 g/dL (ref 1.5–4.5)
Glucose: 97 mg/dL (ref 65–99)
Potassium: 4.5 mmol/L (ref 3.5–5.2)
SODIUM: 141 mmol/L (ref 134–144)
Total Protein: 7.1 g/dL (ref 6.0–8.5)

## 2017-01-08 LAB — LIPID PANEL
CHOL/HDL RATIO: 5.3 ratio — AB (ref 0.0–5.0)
Cholesterol, Total: 207 mg/dL — ABNORMAL HIGH (ref 100–199)
HDL: 39 mg/dL — ABNORMAL LOW (ref >39)
LDL CALC: 112 mg/dL — AB (ref 0–99)
TRIGLYCERIDES: 279 mg/dL — AB (ref 0–149)
VLDL Cholesterol Cal: 56 mg/dL — ABNORMAL HIGH (ref 5–40)

## 2017-01-08 LAB — HIV ANTIBODY (ROUTINE TESTING W REFLEX): HIV Screen 4th Generation wRfx: NONREACTIVE

## 2017-01-08 LAB — HEPATITIS C ANTIBODY

## 2017-01-08 LAB — PSA: Prostate Specific Ag, Serum: <0.1 ng/mL (ref 0.0–4.0)

## 2017-01-11 ENCOUNTER — Encounter: Payer: Self-pay | Admitting: Family Medicine

## 2017-01-18 ENCOUNTER — Encounter: Payer: Self-pay | Admitting: Family Medicine

## 2017-03-06 ENCOUNTER — Ambulatory Visit (INDEPENDENT_AMBULATORY_CARE_PROVIDER_SITE_OTHER): Payer: No Typology Code available for payment source

## 2017-03-06 ENCOUNTER — Ambulatory Visit (INDEPENDENT_AMBULATORY_CARE_PROVIDER_SITE_OTHER): Payer: No Typology Code available for payment source | Admitting: Family Medicine

## 2017-03-06 ENCOUNTER — Encounter: Payer: Self-pay | Admitting: Family Medicine

## 2017-03-06 VITALS — BP 129/87 | HR 69 | Temp 98.2°F | Resp 16 | Ht 74.0 in | Wt 252.0 lb

## 2017-03-06 DIAGNOSIS — M1A9XX0 Chronic gout, unspecified, without tophus (tophi): Secondary | ICD-10-CM | POA: Diagnosis not present

## 2017-03-06 DIAGNOSIS — M7582 Other shoulder lesions, left shoulder: Secondary | ICD-10-CM

## 2017-03-06 DIAGNOSIS — M25512 Pain in left shoulder: Secondary | ICD-10-CM

## 2017-03-06 MED ORDER — MELOXICAM 7.5 MG PO TABS: 7.5000 mg | ORAL_TABLET | Freq: Every day | ORAL | 0 refills | Status: DC | PRN

## 2017-03-06 MED FILL — MELOXICAM 7.5 MG TABLET: 7.5 | 30 days supply | Qty: 30 | Fill #0

## 2017-03-06 NOTE — Patient Instructions (Addendum)
Increase allopurinol to 300mg  per day.  Recheck uric acid in next 6 weeks (lab only visit).   Shoulder pain may be a combination of impingement/bursitis as well as rotator cuff tendinitis. Try meloxicam once per day -  do not combine with other anti-inflammatories including ibuprofen or indomethacin. Tylenol is okay.  If not improving in the next few weeks, may be worth meeting with orthopedist or can try subacromial/bursa injection here if you would like first.  Return to the clinic or go to the nearest emergency room if any of your symptoms worsen or new symptoms occur.   Rotator Cuff Tendinitis Rotator cuff tendinitis is inflammation of the tough, cord-like bands that connect muscle to bone (tendons) in the rotator cuff. The rotator cuff includes all of the muscles and tendons that connect the arm to the shoulder. The rotator cuff holds the head of the upper arm bone (humerus) in the cup (fossa) of the shoulder blade (scapula). This condition can lead to a long-lasting (chronic) tear. The tear may be partial or complete. What are the causes? This condition is usually caused by overusing the rotator cuff. What increases the risk? This condition is more likely to develop in athletes and workers who frequently use their shoulder or reach over their heads. This can include activities such as:  Tennis.  Baseball or softball.  Swimming.  Construction work.  Painting.  What are the signs or symptoms? Symptoms of this condition include:  Pain spreading (radiating) from the shoulder to the upper arm.  Swelling and tenderness in front of the shoulder.  Pain when reaching, pulling, or lifting the arm above the head.  Pain when lowering the arm from above the head.  Minor pain in the shoulder when resting.  Increased pain in the shoulder at night.  Difficulty placing the arm behind the back.  How is this diagnosed? This condition is diagnosed with a medical history and physical  exam. Tests may also be done, including:  X-rays.  MRI.  Ultrasounds.  CT or MR arthrogram. During this test, a contrast material is injected and then images are taken.  How is this treated? Treatment for this condition depends on the severity of the condition. In less severe cases, treatment may include:  Rest. This may be done with a sling that holds the shoulder still (immobilization). Your health care provider may also recommend avoiding activities that involve lifting your arm over your head.  Icing the shoulder.  Anti-inflammatory medicines, such as aspirin or ibuprofen.  In more severe cases, treatment may include:  Physical therapy.  Steroid injections.  Surgery.  Follow these instructions at home: If you have a sling:  Wear the sling as told by your health care provider. Remove it only as told by your health care provider.  Loosen the sling if your fingers tingle, become numb, or turn cold and blue.  Keep the sling clean.  If the sling is not waterproof, do not let it get wet. Remove it, if allowed, or cover it with a watertight covering when you take a bath or shower. Managing pain, stiffness, and swelling  If directed, put ice on the injured area. ? If you have a removable sling, remove it as told by your health care provider. ? Put ice in a plastic bag. ? Place a towel between your skin and the bag. ? Leave the ice on for 20 minutes, 2-3 times a day.  Move your fingers often to avoid stiffness and to lessen swelling.  Raise (elevate) the injured area above the level of your heart while you are lying down.  Find a comfortable sleeping position or sleep on a recliner, if available. Driving  Do not drive or use heavy machinery while taking prescription pain medicine.  Ask your health care provider when it is safe to drive if you have a sling on your arm. Activity  Rest your shoulder as told by your health care provider.  Return to your normal  activities as told by your health care provider. Ask your health care provider what activities are safe for you.  Do any exercises or stretches as told by your health care provider.  If you do repetitive overhead tasks, take small breaks in between and include stretching exercises as told by your health care provider. General instructions  Do not use any products that contain nicotine or tobacco, such as cigarettes and e-cigarettes. These can delay healing. If you need help quitting, ask your health care provider.  Take over-the-counter and prescription medicines only as told by your health care provider.  Keep all follow-up visits as told by your health care provider. This is important. Contact a health care provider if:  Your pain gets worse.  You have new pain in your arm, hands, or fingers.  Your pain is not relieved with medicine or does not get better after 6 weeks of treatment.  You have cracking sensations when moving your shoulder in certain directions.  You hear a snapping sound after using your shoulder, followed by severe pain and weakness. Get help right away if:  Your arm, hand, or fingers are numb or tingling.  Your arm, hand, or fingers are swollen or painful or they turn white or blue. Summary  Rotator cuff tendinitis is inflammation of the tough, cord-like bands that connect muscle to bone (tendons) in the rotator cuff.  This condition is usually caused by overusing the rotator cuff, which includes all of the muscles and tendons that connect the arm to the shoulder.  This condition is more likely to develop in athletes and workers who frequently use their shoulder or reach over their heads.  Treatment generally includes rest, anti-inflammatory medicines, and icing. In some cases, physical therapy and steroid injections may be needed. In severe cases, surgery may be needed. This information is not intended to replace advice given to you by your health care  provider. Make sure you discuss any questions you have with your health care provider. Document Released: 03/30/2003 Document Revised: 12/25/2015 Document Reviewed: 12/25/2015 Elsevier Interactive Patient Education  2017 [place].  Shoulder Pain Many things can cause shoulder pain, including:  An injury to the area.  Overuse of the shoulder.  Arthritis.  The source of the pain can be:  Inflammation.  An injury to the shoulder joint.  An injury to a tendon, ligament, or bone.  Follow these instructions at home: Take these actions to help with your pain:  Squeeze a soft ball or a foam pad as much as possible. This helps to keep the shoulder from swelling. It also helps to strengthen the arm.  Take over-the-counter and prescription medicines only as told by your health care provider.  If directed, apply ice to the area: ? Put ice in a plastic bag. ? Place a towel between your skin and the bag. ? Leave the ice on for 20 minutes, 2-3 times per day. Stop applying ice if it does not help with the pain.  If you were given a shoulder  sling or immobilizer: ? Wear it as told. ? Remove it to shower or bathe. ? Move your arm as little as possible, but keep your hand moving to prevent swelling.  Contact a health care provider if:  Your pain gets worse.  Your pain is not relieved with medicines.  New pain develops in your arm, hand, or fingers. Get help right away if:  Your arm, hand, or fingers: ? Tingle. ? Become numb. ? Become swollen. ? Become painful. ? Turn white or blue. This information is not intended to replace advice given to you by your health care provider. Make sure you discuss any questions you have with your health care provider. Document Released: 10/17/2004 Document Revised: 09/03/2015 Document Reviewed: 05/02/2014 Elsevier Interactive Patient Education  2018 [employer].     IF you received an x-ray today, you will receive an invoice from  [facility]. Please contact [facility] at [phone] with questions or concerns regarding your invoice.   IF you received labwork today, you will receive an invoice from [facility]. Please contact [facility] at [phone] with questions or concerns regarding your invoice.   Our billing staff will not be able to assist you with questions regarding bills from these companies.  You will be contacted with the lab results as soon as they are available. The fastest way to get your results is to activate your My Chart account. Instructions are located on the last page of this paperwork. If you have not heard from [place] regarding the results in 2 weeks, please contact this office.

## 2017-03-06 NOTE — Progress Notes (Signed)
I,Adam Cunningham,acting as a Education administrator for Adam Agreste, MD.,have documented all relevant documentation on the behalf of Adam Agreste, MD,as directed by  Adam Agreste, MD while in the presence of Adam Agreste, MD.  Subjective:    Patient ID: Adam Cunningham, male    DOB: 11-08-1960, 57 y.o.   MRN: [id number]  Chief Complaint  Patient presents with  . Shoulder Pain    Left  . Medication Dose Change    Allopurinol    HPI  Adam Cunningham is a 57 y.o. male who presents to Primary Care at [facility] complaining of intermittent left shoulder pain that has been bothering him for the last few years. He states that it is exacerbated with certain movements. He was seen 12/2015 for same symptoms. At that time, Impingement syndrome was discussed. He was advised to do at home exercises that he reports mildly helped. When the pain was the worst he would take tylenol and/ or ibuprofen as needed.   He was last seen December 2018.   Gout Recommedded uric acid  <6  Allopurinol 200 mg a day increase to 300 mg  Had two recent flare ups in his left great toe. He recently got a new pair shoes that have caused a lot of pressure to his feet. Lab Results  Component Value Date   LABURIC 8.4 01/07/2017      Patient Active Problem List   Diagnosis Date Noted  . Hyperlipidemia 11/26/2011  . Gout 11/26/2011  . Low testosterone 11/26/2011  . H/O prostate cancer 05/26/2007   Past Medical History:  Diagnosis Date  . Cancer [facility])    prostate   Past Surgical History:  Procedure Laterality Date  . BICEPS TENDON REPAIR    . PROSTATE SURGERY     No Known Allergies Prior to Admission medications   Medication Sig Start Date End Date Taking? Authorizing Provider  allopurinol (ZYLOPRIM) 100 MG tablet Take 1 tablet (100 mg total) by mouth 2 (two) times daily. 01/07/17  Yes Adam Agreste, MD  colchicine 0.6 MG tablet Take 1 tablet (0.6 mg total) by mouth daily. 09/30/15  Yes Adam Agreste, MD  cyclobenzaprine (FLEXERIL) 5 MG tablet TAKE 1 TABLET BY MOUTH UP TO EVERY 8 HOURS AS NEEDED. START WITH 1 TABLET EACH NIGHT AT BEDTIME AS NEEDED DUE TO SEDATION 01/07/17  Yes Adam Agreste, MD  HYDROcodone-acetaminophen (NORCO) 5-325 MG tablet Take 1 tablet by mouth every 6 (six) hours as needed for moderate pain. 12/27/15  Yes Adam Agreste, MD  indomethacin (INDOCIN) 50 MG capsule Take 1 capsule (50 mg total) by mouth 2 (two) times daily with a meal. 12/27/13  Yes Daub, Loura Back, MD   Social History   Socioeconomic History  . Marital status: Married    Spouse name: Not on file  . Number of children: Not on file  . Years of education: Not on file  . Highest education level: Not on file  Social Needs  . Financial resource strain: Not on file  . Food insecurity - worry: Not on file  . Food insecurity - inability: Not on file  . Transportation needs - medical: Not on file  . Transportation needs - non-medical: Not on file  Occupational History  . 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 Occupation: Airline pilot  Tobacco Use  . Smoking status: Never Smoker  . Smokeless tobacco: Never Used  Substance and Sexual Activity  . Alcohol use: No  .  Drug use: No  . Sexual activity: Yes  Other Topics Concern  . 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   Social research officer, government   Education: [employer]   Exercise: Yes     Review of Systems  13 point ROS reviewed and negative unless noted above     Objective:   Physical Exam  Constitutional: He is oriented to person, place, and time. He appears well-developed and well-nourished. No distress.  HENT:  Head: Normocephalic and atraumatic.  Cardiovascular: Normal rate.  Pulmonary/Chest: Effort normal.  Musculoskeletal:  C-spine: FROM and does not reproduce shoulder pain AC, [place], clavicle non tender No focal bony tenderness to left shoulder  Equal internal rotation FROM Minimal discomfit with empty can Full  RTC strength Positive neer Mild positive hawkins Positive [place]   Neurological: He is alert and oriented to person, place, and time.  Psychiatric: He has a normal mood and affect.    Vitals:   03/06/17 1026  BP: 129/87  Pulse: 69  Resp: 16  Temp: 98.2 F (36.8 C)  TempSrc: Oral  SpO2: 95%  Weight: 252 lb (114.3 kg)  Height: 6\' 2"  (1.88 m)  Dg Shoulder Left  Result Date: 03/06/2017 CLINICAL DATA:  Left shoulder pain. EXAM: LEFT SHOULDER - 2+ VIEW COMPARISON:  04/29/2014. FINDINGS: Acromioclavicular and glenohumeral degenerative change. No evidence of fracture or dislocation. IMPRESSION: Degenerative changes left shoulder.  No acute abnormality. Electronically Signed   By: Marcello Moores  Register   On: 03/06/2017 11:47       Assessment & Plan:    MARVON Cunningham is a 57 y.o. male Chronic gout involving toe of left foot without tophus, unspecified cause - Plan: Uric Acid  - Elevated uric acid with increased frequency of flares. Increase allopurinol to 3 mg daily, return for lab only visit for uric acid in 6 weeks.  Left shoulder pain, unspecified chronicity - Plan: DG Shoulder Left, meloxicam (MOBIC) 7.5 MG tablet Rotator cuff tendonitis, left - Plan: DG Shoulder Left, meloxicam (MOBIC) 7.5 MG tablet  -May have combination of impingement syndrome and rotator cuff tendinosis. Various treatment options were discussed including short course of oral anti-inflammatory, subacromial injection, refer to orthopedics or physical therapy.  - Initial trial of meloxicam, Tylenol if needed, recheck next few weeks with possible injection at that time for diagnostic and treatment purposes to evaluate impingement versus other rotator cuff issue. RTC precautions if worsening sooner.  Meds ordered this encounter  Medications  . meloxicam (MOBIC) 7.5 MG tablet    Sig: Take 1 tablet (7.5 mg total) by mouth daily as needed for pain.    Dispense:  30 tablet    Refill:  0   Patient Instructions     Increase allopurinol to 300mg  per day.  Recheck uric acid in next 6 weeks (lab only visit).   Shoulder pain may be a combination of impingement/bursitis as well as rotator cuff tendinitis. Try meloxicam once per day -  do not combine with other anti-inflammatories including ibuprofen or indomethacin. Tylenol is okay.  If not improving in the next few weeks, may be worth meeting with orthopedist or can try subacromial/bursa injection here if you would like first.  Return to the clinic or go to the nearest emergency room if any of your symptoms worsen or new symptoms occur.   Rotator Cuff Tendinitis Rotator cuff tendinitis is inflammation of the tough, cord-like bands that connect muscle to bone (tendons) in the rotator cuff. The  rotator cuff includes all of the muscles and tendons that connect the arm to the shoulder. The rotator cuff holds the head of the upper arm bone (humerus) in the cup (fossa) of the shoulder blade (scapula). This condition can lead to a long-lasting (chronic) tear. The tear may be partial or complete. What are the causes? This condition is usually caused by overusing the rotator cuff. What increases the risk? This condition is more likely to develop in athletes and workers who frequently use their shoulder or reach over their heads. This can include activities such as:  Tennis.  Baseball or softball.  Swimming.  Construction work.  Painting.  What are the signs or symptoms? Symptoms of this condition include:  Pain spreading (radiating) from the shoulder to the upper arm.  Swelling and tenderness in front of the shoulder.  Pain when reaching, pulling, or lifting the arm above the head.  Pain when lowering the arm from above the head.  Minor pain in the shoulder when resting.  Increased pain in the shoulder at night.  Difficulty placing the arm behind the back.  How is this diagnosed? This condition is diagnosed with a medical history and  physical exam. Tests may also be done, including:  X-rays.  MRI.  Ultrasounds.  CT or MR arthrogram. During this test, a contrast material is injected and then images are taken.  How is this treated? Treatment for this condition depends on the severity of the condition. In less severe cases, treatment may include:  Rest. This may be done with a sling that holds the shoulder still (immobilization). Your health care provider may also recommend avoiding activities that involve lifting your arm over your head.  Icing the shoulder.  Anti-inflammatory medicines, such as aspirin or ibuprofen.  In more severe cases, treatment may include:  Physical therapy.  Steroid injections.  Surgery.  Follow these instructions at home: If you have a sling:  Wear the sling as told by your health care provider. Remove it only as told by your health care provider.  Loosen the sling if your fingers tingle, become numb, or turn cold and blue.  Keep the sling clean.  If the sling is not waterproof, do not let it get wet. Remove it, if allowed, or cover it with a watertight covering when you take a bath or shower. Managing pain, stiffness, and swelling  If directed, put ice on the injured area. ? If you have a removable sling, remove it as told by your health care provider. ? Put ice in a plastic bag. ? Place a towel between your skin and the bag. ? Leave the ice on for 20 minutes, 2-3 times a day.  Move your fingers often to avoid stiffness and to lessen swelling.  Raise (elevate) the injured area above the level of your heart while you are lying down.  Find a comfortable sleeping position or sleep on a recliner, if available. Driving  Do not drive or use heavy machinery while taking prescription pain medicine.  Ask your health care provider when it is safe to drive if you have a sling on your arm. Activity  Rest your shoulder as told by your health care provider.  Return to your  normal activities as told by your health care provider. Ask your health care provider what activities are safe for you.  Do any exercises or stretches as told by your health care provider.  If you do repetitive overhead tasks, take small breaks in between and  include stretching exercises as told by your health care provider. General instructions  Do not use any products that contain nicotine or tobacco, such as cigarettes and e-cigarettes. These can delay healing. If you need help quitting, ask your health care provider.  Take over-the-counter and prescription medicines only as told by your health care provider.  Keep all follow-up visits as told by your health care provider. This is important. Contact a health care provider if:  Your pain gets worse.  You have new pain in your arm, hands, or fingers.  Your pain is not relieved with medicine or does not get better after 6 weeks of treatment.  You have cracking sensations when moving your shoulder in certain directions.  You hear a snapping sound after using your shoulder, followed by severe pain and weakness. Get help right away if:  Your arm, hand, or fingers are numb or tingling.  Your arm, hand, or fingers are swollen or painful or they turn white or blue. Summary  Rotator cuff tendinitis is inflammation of the tough, cord-like bands that connect muscle to bone (tendons) in the rotator cuff.  This condition is usually caused by overusing the rotator cuff, which includes all of the muscles and tendons that connect the arm to the shoulder.  This condition is more likely to develop in athletes and workers who frequently use their shoulder or reach over their heads.  Treatment generally includes rest, anti-inflammatory medicines, and icing. In some cases, physical therapy and steroid injections may be needed. In severe cases, surgery may be needed. This information is not intended to replace advice given to you by your health care  provider. Make sure you discuss any questions you have with your health care provider. Document Released: 03/30/2003 Document Revised: 12/25/2015 Document Reviewed: 12/25/2015 Elsevier Interactive Patient Education  2017 [place].  Shoulder Pain Many things can cause shoulder pain, including:  An injury to the area.  Overuse of the shoulder.  Arthritis.  The source of the pain can be:  Inflammation.  An injury to the shoulder joint.  An injury to a tendon, ligament, or bone.  Follow these instructions at home: Take these actions to help with your pain:  Squeeze a soft ball or a foam pad as much as possible. This helps to keep the shoulder from swelling. It also helps to strengthen the arm.  Take over-the-counter and prescription medicines only as told by your health care provider.  If directed, apply ice to the area: ? Put ice in a plastic bag. ? Place a towel between your skin and the bag. ? Leave the ice on for 20 minutes, 2-3 times per day. Stop applying ice if it does not help with the pain.  If you were given a shoulder sling or immobilizer: ? Wear it as told. ? Remove it to shower or bathe. ? Move your arm as little as possible, but keep your hand moving to prevent swelling.  Contact a health care provider if:  Your pain gets worse.  Your pain is not relieved with medicines.  New pain develops in your arm, hand, or fingers. Get help right away if:  Your arm, hand, or fingers: ? Tingle. ? Become numb. ? Become swollen. ? Become painful. ? Turn white or blue. This information is not intended to replace advice given to you by your health care provider. Make sure you discuss any questions you have with your health care provider. Document Released: 10/17/2004 Document Revised: 09/03/2015 Document Reviewed: 05/02/2014  Elsevier Interactive Patient Education  [employer].     IF you received an x-ray today, you will receive an invoice from  [facility]. Please contact [facility] at [phone] with questions or concerns regarding your invoice.   IF you received labwork today, you will receive an invoice from [facility]. Please contact [facility] at [phone] with questions or concerns regarding your invoice.   Our billing staff will not be able to assist you with questions regarding bills from these companies.  You will be contacted with the lab results as soon as they are available. The fastest way to get your results is to activate your My Chart account. Instructions are located on the last page of this paperwork. If you have not heard from [place] regarding the results in 2 weeks, please contact this office.      I personally performed the services described in this documentation, which was scribed in my presence. The recorded information has been reviewed and considered for accuracy and completeness, addended by me as needed, and agree with information above.  Signed,   Merri Ray, MD [facility] at [place].  03/08/17 10:08 PM

## 2017-04-05 ENCOUNTER — Encounter: Payer: Self-pay | Admitting: Family Medicine

## 2017-04-21 ENCOUNTER — Other Ambulatory Visit: Payer: Self-pay

## 2017-04-21 ENCOUNTER — Encounter: Payer: Self-pay | Admitting: Family Medicine

## 2017-04-21 ENCOUNTER — Ambulatory Visit (INDEPENDENT_AMBULATORY_CARE_PROVIDER_SITE_OTHER): Payer: No Typology Code available for payment source | Admitting: Family Medicine

## 2017-04-21 VITALS — BP 112/80 | HR 74 | Temp 98.5°F | Resp 95 | Ht 74.0 in | Wt 256.4 lb

## 2017-04-21 DIAGNOSIS — E785 Hyperlipidemia, unspecified: Secondary | ICD-10-CM

## 2017-04-21 DIAGNOSIS — M25512 Pain in left shoulder: Secondary | ICD-10-CM

## 2017-04-21 DIAGNOSIS — E669 Obesity, unspecified: Secondary | ICD-10-CM | POA: Diagnosis not present

## 2017-04-21 DIAGNOSIS — Z6832 Body mass index (BMI) 32.0-32.9, adult: Secondary | ICD-10-CM

## 2017-04-21 DIAGNOSIS — M109 Gout, unspecified: Secondary | ICD-10-CM

## 2017-04-21 MED ORDER — ALLOPURINOL 300 MG PO TABS: 300.0000 mg | ORAL_TABLET | Freq: Every day | ORAL | 1 refills | Status: DC

## 2017-04-21 MED FILL — ALLOPURINOL 300 MG TAB: 300 | 90 days supply | Qty: 90 | Fill #0

## 2017-04-21 NOTE — Patient Instructions (Addendum)
OK to continue allopurinol same dose. I will check gout test again today.   If shoulder pain persists after injection, then let me know so I can refer you to orthopaedics. Return to the clinic or go to the nearest emergency room if any of your symptoms worsen or new symptoms occur. Watch for any redness or discharge at the injection site. I do not expect that to happen.      Shoulder Pain Many things can cause shoulder pain, including:  An injury to the area.  Overuse of the shoulder.  Arthritis.  The source of the pain can be:  Inflammation.  An injury to the shoulder joint.  An injury to a tendon, ligament, or bone.  Follow these instructions at home: Take these actions to help with your pain:  Squeeze a soft ball or a foam pad as much as possible. This helps to keep the shoulder from swelling. It also helps to strengthen the arm.  Take over-the-counter and prescription medicines only as told by your health care provider.  If directed, apply ice to the area: ? Put ice in a plastic bag. ? Place a towel between your skin and the bag. ? Leave the ice on for 20 minutes, 2-3 times per day. Stop applying ice if it does not help with the pain.  If you were given a shoulder sling or immobilizer: ? Wear it as told. ? Remove it to shower or bathe. ? Move your arm as little as possible, but keep your hand moving to prevent swelling.  Contact a health care provider if:  Your pain gets worse.  Your pain is not relieved with medicines.  New pain develops in your arm, hand, or fingers. Get help right away if:  Your arm, hand, or fingers: ? Tingle. ? Become numb. ? Become swollen. ? Become painful. ? Turn white or blue. This information is not intended to replace advice given to you by your health care provider. Make sure you discuss any questions you have with your health care provider. Document Released: 10/17/2004 Document Revised: 09/03/2015 Document Reviewed:  05/02/2014 Elsevier Interactive Patient Education  2018 [employer].   IF you received an x-ray today, you will receive an invoice from [facility]. Please contact [facility] at [phone] with questions or concerns regarding your invoice.   IF you received labwork today, you will receive an invoice from [facility]. Please contact [facility] at [phone] with questions or concerns regarding your invoice.   Our billing staff will not be able to assist you with questions regarding bills from these companies.  You will be contacted with the lab results as soon as they are available. The fastest way to get your results is to activate your My Chart account. Instructions are located on the last page of this paperwork. If you have not heard from [place] regarding the results in 2 weeks, please contact this office.

## 2017-04-21 NOTE — Progress Notes (Addendum)
Subjective:    Patient ID: Adam Cunningham, male    DOB: 1960/01/26, 57 y.o.   MRN: [id number]  HPI Adam Cunningham is a 57 y.o. male Presents today for: Chief Complaint  Patient presents with  . Left Shoulder Pain    F/u and injection   Gout: See last office visit in February.  Frequent flares discussed at that time.  Allopurinol increased to 300 mg daily, plan for uric acid recheck today. One minor flair on the 300mg  dose.  Lab Results  Component Value Date   LABURIC 8.4 01/07/2017   Left shoulder pain: See prior visits.  Suspect a component of rotator cuff tendinitis as well as possible impingement syndrome/subacromial bursitis.  Initially tried meloxicam, Tylenol if needed, range of motion.  Presents today to discuss injection. Feels about the same.  Still sore to move arm a certain way. Pain wakes him up at night at times. Did not notice sufficient improvement with meloxicam. Limited in flexion to wash hair - has to adjust movements. No neck pain, no weakness or hand parasthesias.   He would also like to meet with a nutritionist.  Slight hyperlipidemia on prior testing 3 months ago, with overweight/obesity Body mass index is 32.92 kg/m.   Patient Active Problem List   Diagnosis Date Noted  . Hyperlipidemia 11/26/2011  . Gout 11/26/2011  . Low testosterone 11/26/2011  . H/O prostate cancer 05/26/2007   Past Medical History:  Diagnosis Date  . Cancer [facility])    prostate   Past Surgical History:  Procedure Laterality Date  . BICEPS TENDON REPAIR    . PROSTATE SURGERY     No Known Allergies Prior to Admission medications   Medication Sig Start Date End Date Taking? Authorizing Provider  allopurinol (ZYLOPRIM) 100 MG tablet Take 1 tablet (100 mg total) by mouth 2 (two) times daily. 01/07/17  Yes [name], MD  colchicine 0.6 MG tablet Take 1 tablet (0.6 mg total) by mouth daily. 09/30/15  Yes [name], MD  cyclobenzaprine (FLEXERIL) 5 MG tablet TAKE  1 TABLET BY MOUTH UP TO EVERY 8 HOURS AS NEEDED. START WITH 1 TABLET EACH NIGHT AT BEDTIME AS NEEDED DUE TO SEDATION 01/07/17  Yes [name], MD  HYDROcodone-acetaminophen (NORCO) 5-325 MG tablet Take 1 tablet by mouth every 6 (six) hours as needed for moderate pain. 12/27/15  Yes [name], MD  indomethacin (INDOCIN) 50 MG capsule Take 1 capsule (50 mg total) by mouth 2 (two) times daily with a meal. 12/27/13  Yes Daub, Loura Back, MD   Social History   Socioeconomic History  . Marital status: Married    Spouse name: Not on file  . Number of children: Not on file  . Years of education: Not on file  . Highest education level: Not on file  Occupational History  . 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 Occupation: Scientist, water quality  . Financial resource strain: Not on file  . Food insecurity:    Worry: Not on file    Inability: Not on file  . Transportation needs:    Medical: Not on file    Non-medical: Not on file  Tobacco Use  . Smoking status: Never Smoker  . Smokeless tobacco: Never Used  Substance and Sexual Activity  . Alcohol use: No  . Drug use: No  . Sexual activity: Yes  Lifestyle  . Physical activity:    Days per week: Not on file  Minutes per session: Not on file  . Stress: Not on file  Relationships  . Social connections:    Talks on phone: Not on file    Gets together: Not on file    Attends religious service: Not on file    Active member of club or organization: Not on file    Attends meetings of clubs or organizations: Not on file    Relationship status: Not on file  . Intimate partner violence:    Fear of current or ex partner: Not on file    Emotionally abused: Not on file    Physically abused: Not on file    Forced sexual activity: Not on file  Other Topics Concern  . 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   Social research officer, government   Education: [employer]   Exercise: Yes    Review of Systems  Respiratory:  Negative for shortness of breath.   Cardiovascular: Negative for chest pain.  Musculoskeletal: Positive for arthralgias. Negative for joint swelling and neck pain.  Skin: Negative for color change and rash.  Neurological: Negative for weakness and numbness.       Objective:   Physical Exam  Constitutional: He is oriented to person, place, and time. He appears well-developed and well-nourished. No distress.  Neck: Normal range of motion. Neck supple.  Pain free ROM  Cardiovascular: Normal rate.  Musculoskeletal:       Left shoulder: He exhibits decreased range of motion (min decreased flexion). He exhibits no tenderness, no bony tenderness, no spasm and normal strength (full RTC strength, discomfort with empty can, positive Neer, Hawkins. ).       Cervical back: He exhibits no tenderness and no pain.  Neurological: He is alert and oriented to person, place, and time.  Skin: Skin is warm and dry. No rash noted.  Psychiatric: He has a normal mood and affect. His behavior is normal.  Vitals reviewed.  Vitals:   04/21/17 0844  BP: 112/80  Pulse: 74  Resp: (!) 95  Temp: 98.5 F (36.9 C)  TempSrc: Oral  Weight: 256 lb 6.4 oz (116.3 kg)  Height: 6\' 2"  (1.88 m)    Risks (including but not limited to bleeding and infection, damage to underlying tissue), benefits, and alternatives discussed for left subacromial injection.  Verbal consent obtained after any questions were answered. Landmarks noted, and marked as needed. Area cleansed with Betadine x3, ethyl chloride spray for topical anesthesia, followed by alcohol swab.  Injected with 1cc kenalog 40mg /ml, 3cc lidocaine 1%plain.  No complications. Bandage applied. Slight improvement after injection.  RTC precautions discussed in regards to injection and aftercare.     Assessment & Plan:    [name] is a 57 y.o. male Gout, unspecified cause, unspecified chronicity, unspecified site - Plan: Uric acid, allopurinol (ZYLOPRIM) 300 MG  tablet  -Improved on higher dose of allopurinol, recheck uric acid.  Continue 300 mg daily.  Left shoulder pain, unspecified chronicity  -Exam today appears to be primarily impingement/subacromial bursitis.  May have component of rotator cuff tendinitis with discomfort on supraspinatus testing.  No bony tenderness.  Options discussed, but without improvement on oral medications and time, elected to try injection.  -Subacromial injection given as above without difficulty or complications.  Some improvement afterwards.  Aftercare discussed and consider orthopedic eval if return of pain.  Class 1 obesity without serious comorbidity with body mass index (BMI) of 32.0 to 32.9 in adult, unspecified obesity  type - Plan: Amb ref to Medical Nutrition Therapy-MNT Hyperlipidemia, unspecified hyperlipidemia type  -Initially planned for diet and exercise for hyperlipidemia.  Would like to meet with nutritionist to review diet changes.  Referral placed.  Meds ordered this encounter  Medications  . allopurinol (ZYLOPRIM) 300 MG tablet    Sig: Take 1 tablet (300 mg total) by mouth daily.    Dispense:  90 tablet    Refill:  1   Patient Instructions   OK to continue allopurinol same dose. I will check gout test again today.   If shoulder pain persists after injection, then let me know so I can refer you to orthopaedics. Return to the clinic or go to the nearest emergency room if any of your symptoms worsen or new symptoms occur. Watch for any redness or discharge at the injection site. I do not expect that to happen.      Shoulder Pain Many things can cause shoulder pain, including:  An injury to the area.  Overuse of the shoulder.  Arthritis.  The source of the pain can be:  Inflammation.  An injury to the shoulder joint.  An injury to a tendon, ligament, or bone.  Follow these instructions at home: Take these actions to help with your pain:  Squeeze a soft ball or a foam pad as much as  possible. This helps to keep the shoulder from swelling. It also helps to strengthen the arm.  Take over-the-counter and prescription medicines only as told by your health care provider.  If directed, apply ice to the area: ? Put ice in a plastic bag. ? Place a towel between your skin and the bag. ? Leave the ice on for 20 minutes, 2-3 times per day. Stop applying ice if it does not help with the pain.  If you were given a shoulder sling or immobilizer: ? Wear it as told. ? Remove it to shower or bathe. ? Move your arm as little as possible, but keep your hand moving to prevent swelling.  Contact a health care provider if:  Your pain gets worse.  Your pain is not relieved with medicines.  New pain develops in your arm, hand, or fingers. Get help right away if:  Your arm, hand, or fingers: ? Tingle. ? Become numb. ? Become swollen. ? Become painful. ? Turn white or blue. This information is not intended to replace advice given to you by your health care provider. Make sure you discuss any questions you have with your health care provider. Document Released: 10/17/2004 Document Revised: 09/03/2015 Document Reviewed: 05/02/2014 Elsevier Interactive Patient Education  2018 [employer].   IF you received an x-ray today, you will receive an invoice from [facility]. Please contact [facility] at [phone] with questions or concerns regarding your invoice.   IF you received labwork today, you will receive an invoice from [facility]. Please contact [facility] at [phone] with questions or concerns regarding your invoice.   Our billing staff will not be able to assist you with questions regarding bills from these companies.  You will be contacted with the lab results as soon as they are available. The fastest way to get your results is to activate your My Chart account. Instructions are located on the last page of this paperwork. If you have not heard from [place]  regarding the results in 2 weeks, please contact this office.      Signed,   Merri Ray, MD [facility] at [facility].  04/21/17 6:55 PM

## 2017-04-22 LAB — URIC ACID: URIC ACID: 6.1 mg/dL (ref 3.7–8.6)

## 2017-04-28 ENCOUNTER — Encounter: Payer: Self-pay | Admitting: Family Medicine

## 2017-06-05 ENCOUNTER — Ambulatory Visit: Payer: Self-pay | Admitting: Nutrition

## 2017-06-09 ENCOUNTER — Encounter: Payer: No Typology Code available for payment source | Attending: Family Medicine | Admitting: Nutrition

## 2017-06-09 VITALS — Ht 73.0 in | Wt 250.0 lb

## 2017-06-09 DIAGNOSIS — Z713 Dietary counseling and surveillance: Secondary | ICD-10-CM | POA: Insufficient documentation

## 2017-06-09 DIAGNOSIS — E669 Obesity, unspecified: Secondary | ICD-10-CM | POA: Insufficient documentation

## 2017-06-09 DIAGNOSIS — Z6832 Body mass index (BMI) 32.0-32.9, adult: Secondary | ICD-10-CM | POA: Insufficient documentation

## 2017-06-09 DIAGNOSIS — E782 Mixed hyperlipidemia: Secondary | ICD-10-CM

## 2017-06-09 NOTE — Patient Instructions (Addendum)
Goals 1. Follow My Plate 2. Cut out sour cream 3. Drink a gallon of water 4. Avoid processed meats and cheese 5. Increase high fiber foods.. Increase low carb vegetables Don't skip meals. Follow high fiber diet. Cut out sweets, tea

## 2017-06-09 NOTE — Progress Notes (Signed)
Medical Nutrition Therapy:  Appt start time: 0930 end time:  1030.   Assessment:  Primary concerns today: First Visit.: Obesity and Hyperlipidemia.Adam Cunningham He is here to lose weight. BMI is 32. Would like to weigh around 180-200 lbs. Lives with his wife and children. PMH: Gout. Says physically active doing yard work and cutting wood. 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 Has lost 3-5 lbs in the last few months but cutting out some of the snack and junk food he was eating and cutting out sweet tea. He notes she only eatly 1-2 meals per day and tends to snack at night sometimes. Hyperlipidemia. He is not on any treatment for that presently.   Current diet is inconsistent to meet his needs contributing to weight gain, elevated CHOL/TG.  He is motivated to make changes with eating and exercise.   CMP Latest Ref Rng & Units 01/07/2017 12/27/2015 12/26/2014  Glucose 65 - 99 mg/dL 97 101(H) 84  BUN 6 - 24 mg/dL 11 15 13   Creatinine 0.76 - 1.27 mg/dL 0.99 0.85 0.92  Sodium 134 - 144 mmol/L 141 143 140  Potassium 3.5 - 5.2 mmol/L 4.5 4.3 3.8  Chloride 96 - 106 mmol/L 101 102 104  CO2 20 - 29 mmol/L 27 25 25   Calcium 8.7 - 10.2 mg/dL 9.4 9.1 8.8  Total Protein 6.0 - 8.5 g/dL 7.1 7.2 7.1  Total Bilirubin 0.0 - 1.2 mg/dL 0.7 0.5 0.9  Alkaline Phos 39 - 117 IU/L 63 61 48  AST 0 - 40 IU/L 21 15 17   ALT 0 - 44 IU/L 27 23 17      Lipid Panel     Component Value Date/Time   CHOL 207 (H) 01/07/2017 1040   TRIG 279 (H) 01/07/2017 1040   HDL 39 (L) 01/07/2017 1040   CHOLHDL 5.3 (H) 01/07/2017 1040   CHOLHDL 4.4 12/26/2014 1148   VLDL 33 (H) 12/26/2014 1148   LDLCALC 112 (H) 01/07/2017 1040   .  Preferred Learning Style:   No preference indicated   Learning Readiness:  Ready  Change in progress   MEDICATIONS:    DIETARY INTAKE:  24-hr recall:  B ( AM):  16-32 oz cups of coffee with cream and sugar and skips usually. Or eggs and toast. Snk ( AM):   L ( PM):  Sandwich-  chicken and cheese, mayo on sandwich thins, water or sweet tea Snk ( PM): misc chips or pretzel sticks or nabs D ( PM): Chicken and rice and cheese sauce 2 cups,   Pita chips , water Snk ( PM): nuts or chips and cheese Beverages: water, sweet tea  Usual physical activity: yard work,   Estimated energy needs: 1800  calories 200  g carbohydrates 135 g protein 50 g fat  Progress Towards Goal(s):  In progress.   Nutritional Diagnosis:  NB-1.1 Food and nutrition-related knowledge deficit As related to OBesity .  As evidenced by BMI 32.    Intervention:  Healthy weight loss Nutrition education provided on My Plate, CHO counting, meal planning, portion sizes, timing of meals, avoiding snacks between meals unless having a low blood sugar, target ranges for A1C and blood sugars, signs/symptoms and treatment of hyper/hypoglycemia, monitoring blood sugars, taking medications as prescribed, benefits of exercising 30 minutes per day and prevention of complications of DM.  Goals 1. Follow My Plate 2. Cut out sour cream 3. Drink a gallon of water 4. Avoid processed meats and cheese 5. Increase high fiber foods.Adam Cunningham  Increase low carb vegetables Don't skip meals. Follow high fiber diet. Cut out sweets, tea    Teaching Method Utilized:  Visual Auditory Hands on  Handouts given during visit include:  MY Plate  Meal Plan Card  Healthy Weight loss tips  Barriers to learning/adherence to lifestyle change: None  Demonstrated degree of understanding via:  Teach Back   Monitoring/Evaluation:  Dietary intake, exercise, meal planning, and body weight in 2-3 month(s).

## 2017-06-11 ENCOUNTER — Encounter: Payer: Self-pay | Admitting: Nutrition

## 2017-07-17 ENCOUNTER — Encounter: Payer: Self-pay | Admitting: Nutrition

## 2017-07-17 ENCOUNTER — Encounter: Payer: No Typology Code available for payment source | Attending: Family Medicine | Admitting: Nutrition

## 2017-07-17 VITALS — Ht 74.0 in | Wt 241.0 lb

## 2017-07-17 DIAGNOSIS — E782 Mixed hyperlipidemia: Secondary | ICD-10-CM

## 2017-07-17 DIAGNOSIS — Z6830 Body mass index (BMI) 30.0-30.9, adult: Secondary | ICD-10-CM | POA: Diagnosis not present

## 2017-07-17 DIAGNOSIS — E785 Hyperlipidemia, unspecified: Secondary | ICD-10-CM | POA: Diagnosis not present

## 2017-07-17 DIAGNOSIS — Z713 Dietary counseling and surveillance: Secondary | ICD-10-CM | POA: Insufficient documentation

## 2017-07-17 DIAGNOSIS — E669 Obesity, unspecified: Secondary | ICD-10-CM | POA: Insufficient documentation

## 2017-07-17 NOTE — Patient Instructions (Signed)
Goals 1. Lose 1-2 lbs per week 2. Continue to monitor portions 3. Increase veggies 4. Eat 2 servings. Of fruit a day

## 2017-07-17 NOTE — Progress Notes (Signed)
Medical Nutrition Therapy:  Appt start time: 1115  end time: 1130  Assessment:  Primary concerns today: Second Visit.: Obesity and Hyperlipidemia..  Lost 9 lbs. Has cut out sour cream, french onion dip,  Chips/sacks.. Watching portions. Trying to be more active. Reading food labels. Working on cutting out saturated fat and increasing fiber while losing weight. .  Wt Readings from Last 3 Encounters:  07/17/17 241 lb (109.3 kg)  06/09/17 250 lb (113.4 kg)  04/21/17 256 lb 6.4 oz (116.3 kg)   Ht Readings from Last 3 Encounters:  07/17/17 6\' 2"  (1.88 m)  06/09/17 6\' 1"  (1.854 m)  04/21/17 6\' 2"  (1.88 m)   Body mass index is 30.94 kg/m. @BMIFA @ Facility age limit for growth percentiles is 20 years. Facility age limit for growth percentiles is 20 years.   CMP Latest Ref Rng & Units 01/07/2017 12/27/2015 12/26/2014  Glucose 65 - 99 mg/dL 97 101(H) 84  BUN 6 - 24 mg/dL 11 15 13   Creatinine 0.76 - 1.27 mg/dL 0.99 0.85 0.92  Sodium 134 - 144 mmol/L 141 143 140  Potassium 3.5 - 5.2 mmol/L 4.5 4.3 3.8  Chloride 96 - 106 mmol/L 101 102 104  CO2 20 - 29 mmol/L 27 25 25   Calcium 8.7 - 10.2 mg/dL 9.4 9.1 8.8  Total Protein 6.0 - 8.5 g/dL 7.1 7.2 7.1  Total Bilirubin 0.0 - 1.2 mg/dL 0.7 0.5 0.9  Alkaline Phos 39 - 117 IU/L 63 61 48  AST 0 - 40 IU/L 21 15 17   ALT 0 - 44 IU/L 27 23 17      Lipid Panel     Component Value Date/Time   CHOL 207 (H) 01/07/2017 1040   TRIG 279 (H) 01/07/2017 1040   HDL 39 (L) 01/07/2017 1040   CHOLHDL 5.3 (H) 01/07/2017 1040   CHOLHDL 4.4 12/26/2014 1148   VLDL 33 (H) 12/26/2014 1148   LDLCALC 112 (H) 01/07/2017 1040   .  Preferred Learning Style:   No preference indicated   Learning Readiness:  Ready  Change in progress   MEDICATIONS:    DIETARY INTAKE:  24-hr recall:  B ( AM): Cup of cofffe;   Cereal or eggs and toast  Snk ( AM):   L ( PM):  2 slices chicken, cheese, sandwich thins, mustard and cottage cheese,  Snk ( PM):  D ( PM):  Pizza 2 slices, water Snk ( PM):  Beverages: water, sweet tea  Usual physical activity: yard work,   Estimated energy needs: 1800  calories 200  g carbohydrates 135 g protein 50 g fat  Progress Towards Goal(s):  In progress.   Nutritional Diagnosis:  NB-1.1 Food and nutrition-related knowledge deficit As related to OBesity .  As evidenced by BMI 32.    Intervention:  Healthy weight loss Nutrition education provided on My Plate, CHO counting, meal planning, portion sizes, timing of meals, avoiding snacks between meals unless having a low blood sugar, target ranges for A1C and blood sugars, signs/symptoms and treatment of hyper/hypoglycemia, monitoring blood sugars, taking medications as prescribed, benefits of exercising 30 minutes per day and prevention of complications of DM. Goals 1. Lose 1-2 lbs per week 2. Continue to monitor portions 3. Increase veggies 4. Eat 2 servings. Of fruit a day  Teaching Method Utilized:  Visual Auditory Hands on  Handouts given during visit include:  MY Plate  Meal Plan Card  Healthy Weight loss tips  Barriers to learning/adherence to lifestyle change: None  Demonstrated degree of  understanding via:  Teach Back   Monitoring/Evaluation:  Dietary intake, exercise, meal planning, and body weight in 2-3 month(s).

## 2017-08-01 MED FILL — ALLOPURINOL 300 MG TABS: 300 | 90 days supply | Qty: 90 | Fill #1

## 2017-09-10 ENCOUNTER — Encounter: Payer: No Typology Code available for payment source | Attending: Family Medicine | Admitting: Nutrition

## 2017-09-10 ENCOUNTER — Encounter: Payer: Self-pay | Admitting: Nutrition

## 2017-09-10 VITALS — Ht 73.0 in | Wt 247.0 lb

## 2017-09-10 DIAGNOSIS — E669 Obesity, unspecified: Secondary | ICD-10-CM

## 2017-09-10 DIAGNOSIS — E7800 Pure hypercholesterolemia, unspecified: Secondary | ICD-10-CM

## 2017-09-10 NOTE — Progress Notes (Signed)
Medical Nutrition Therapy:  Appt start time: 1115  end time: 1130  Assessment:  Primary concerns today: Second Visit.: Obesity and Hyperlipidemia..  Gained 6 lbs. Just got back from vacation. He is back on track to eating better quality foods, more fresh fruits and vegetables.  More active outdoors.    Wt Readings from Last 3 Encounters:  07/17/17 241 lb (109.3 kg)  06/09/17 250 lb (113.4 kg)  04/21/17 256 lb 6.4 oz (116.3 kg)   Ht Readings from Last 3 Encounters:  07/17/17 6\' 2"  (1.88 m)  06/09/17 6\' 1"  (1.854 m)  04/21/17 6\' 2"  (1.88 m)   There is no height or weight on file to calculate BMI. @BMIFA @ Facility age limit for growth percentiles is 20 years. Facility age limit for growth percentiles is 20 years.   CMP Latest Ref Rng & Units 01/07/2017 12/27/2015 12/26/2014  Glucose 65 - 99 mg/dL 97 101(H) 84  BUN 6 - 24 mg/dL 11 15 13   Creatinine 0.76 - 1.27 mg/dL 0.99 0.85 0.92  Sodium 134 - 144 mmol/L 141 143 140  Potassium 3.5 - 5.2 mmol/L 4.5 4.3 3.8  Chloride 96 - 106 mmol/L 101 102 104  CO2 20 - 29 mmol/L 27 25 25   Calcium 8.7 - 10.2 mg/dL 9.4 9.1 8.8  Total Protein 6.0 - 8.5 g/dL 7.1 7.2 7.1  Total Bilirubin 0.0 - 1.2 mg/dL 0.7 0.5 0.9  Alkaline Phos 39 - 117 IU/L 63 61 48  AST 0 - 40 IU/L 21 15 17   ALT 0 - 44 IU/L 27 23 17      Lipid Panel     Component Value Date/Time   CHOL 207 (H) 01/07/2017 1040   TRIG 279 (H) 01/07/2017 1040   HDL 39 (L) 01/07/2017 1040   CHOLHDL 5.3 (H) 01/07/2017 1040   CHOLHDL 4.4 12/26/2014 1148   VLDL 33 (H) 12/26/2014 1148   LDLCALC 112 (H) 01/07/2017 1040   .  Preferred Learning Style:   No preference indicated   Learning Readiness:  Ready  Change in progress   MEDICATIONS:    DIETARY INTAKE:  24-hr recall:  B ( AM): Yogurt and coffe Snk ( AM):   L ( PM):  Leftovers, or sandwich  And water, cottage cheese Snk ( PM):  D ( PM): 2 pieces of chicken fried, and french fries baked, water Snk ( PM):  Beverages:  water, sweet tea  Usual physical activity: yard work,   Estimated energy needs: 1800  calories 200  g carbohydrates 135 g protein 50 g fat  Progress Towards Goal(s):  In progress.   Nutritional Diagnosis:  NB-1.1 Food and nutrition-related knowledge deficit As related to OBesity .  As evidenced by BMI 32.    Intervention:  Healthy weight loss Nutrition education provided on My Plate, CHO counting, meal planning, portion sizes, timing of meals, avoiding snacks between meals unless having a low blood sugar, target ranges for A1C and blood sugars, signs/symptoms and treatment of hyper/hypoglycemia, monitoring blood sugars, taking medications as prescribed, benefits of exercising 30 minutes per day and prevention of DM. HIgh Fiber Diet  Goals 1 Increase exercise to 60 minutes 2-3 times per week 2. Lose 10 lbs and get to 230 lbs before Thanksgiving. 3. Increase lower carb vegetables with meals. Reduce cholesterol to less than 200 and TG less than 150 mg/dl. Increase high fiber foods.   Teaching Method Utilized:  Visual Auditory Hands on  Handouts given during visit include:  MY Plate  Meal Plan  Card  Healthy Weight loss tips  Barriers to learning/adherence to lifestyle change: None  Demonstrated degree of understanding via:  Teach Back   Monitoring/Evaluation:  Dietary intake, exercise, meal planning, and body weight in PRN

## 2017-09-10 NOTE — Patient Instructions (Addendum)
Goals 1 Increase exercise to 60 minutes 2-3 times per week 2. Lose 10 lbs and get to 230 lbs before Thanksgiving. 3. Increase lower carb vegetables with meals. Reduce cholesterol to less than 200 and TG less than 150 mg/dl. Increase high fiber foods.

## 2017-11-24 ENCOUNTER — Encounter: Payer: Self-pay | Admitting: Family Medicine

## 2017-11-25 ENCOUNTER — Other Ambulatory Visit: Payer: Self-pay

## 2017-11-25 DIAGNOSIS — M109 Gout, unspecified: Secondary | ICD-10-CM

## 2017-11-25 MED ORDER — ALLOPURINOL 300 MG PO TABS: 300.0000 mg | ORAL_TABLET | Freq: Every day | ORAL | 0 refills | Status: DC

## 2017-11-28 ENCOUNTER — Other Ambulatory Visit: Payer: Self-pay

## 2017-11-28 DIAGNOSIS — M109 Gout, unspecified: Secondary | ICD-10-CM

## 2017-11-28 MED ORDER — ALLOPURINOL 300 MG PO TABS: 300.0000 mg | ORAL_TABLET | Freq: Every day | ORAL | 0 refills | Status: DC

## 2017-11-28 MED FILL — ALLOPURINOL 300 MG TAB: 300 | 90 days supply | Qty: 90 | Fill #0

## 2018-01-06 ENCOUNTER — Ambulatory Visit (INDEPENDENT_AMBULATORY_CARE_PROVIDER_SITE_OTHER): Payer: No Typology Code available for payment source | Admitting: Family Medicine

## 2018-01-06 ENCOUNTER — Other Ambulatory Visit: Payer: Self-pay

## 2018-01-06 ENCOUNTER — Encounter: Payer: Self-pay | Admitting: Family Medicine

## 2018-01-06 VITALS — BP 118/83 | HR 75 | Temp 98.3°F | Ht 73.0 in | Wt 248.2 lb

## 2018-01-06 DIAGNOSIS — E785 Hyperlipidemia, unspecified: Secondary | ICD-10-CM | POA: Diagnosis not present

## 2018-01-06 DIAGNOSIS — Z0001 Encounter for general adult medical examination with abnormal findings: Secondary | ICD-10-CM

## 2018-01-06 DIAGNOSIS — Z0000 Encounter for general adult medical examination without abnormal findings: Secondary | ICD-10-CM

## 2018-01-06 DIAGNOSIS — Z23 Encounter for immunization: Secondary | ICD-10-CM

## 2018-01-06 DIAGNOSIS — M109 Gout, unspecified: Secondary | ICD-10-CM | POA: Diagnosis not present

## 2018-01-06 DIAGNOSIS — Z125 Encounter for screening for malignant neoplasm of prostate: Secondary | ICD-10-CM

## 2018-01-06 MED FILL — CYCLOBENZAPRINE HCL 5 MG TA: 5 | 10 days supply | Qty: 30 | Fill #0

## 2018-01-06 NOTE — Patient Instructions (Addendum)
Follow up in 6 months to recheck weight and possible labs then.  Increase activity and watch diet especially avoiding sugar containing beverages for weight loss. Let me know if there are questions.   Cholesterol only borderline level of recommendations for statins.  I will recheck labs today and we can discuss those recommendations further.  Weight loss with diet and exercise should also help those numbers.  Thank you for coming in today.  Keeping you healthy  Get these tests  Blood pressure- Have your blood pressure checked once a year by your healthcare provider.  Normal blood pressure is 120/80  Weight- Have your body mass index (BMI) calculated to screen for obesity.  BMI is a measure of body fat based on height and weight. You can also calculate your own BMI at [email or web address].  Cholesterol- Have your cholesterol checked every year.  Diabetes- Have your blood sugar checked regularly if you have high blood pressure, high cholesterol, have a family history of diabetes or if you are overweight.  Screening for Colon Cancer- Colonoscopy starting at age 60.  Screening may begin sooner depending on your family history and other health conditions. Follow up colonoscopy as directed by your Gastroenterologist.  Screening for Prostate Cancer- Both blood work (PSA) and a rectal exam help screen for Prostate Cancer.  Screening begins at age 72 with African-American men and at age 33 with Caucasian men.  Screening may begin sooner depending on your family history.  Take these medicines  Aspirin- One aspirin daily can help prevent Heart disease and Stroke.  Flu shot- Every fall.  Tetanus- Every 10 years.  Zostavax- Once after the age of 35 to prevent Shingles.  Pneumonia shot- Once after the age of 30; if you are younger than 61, ask your healthcare provider if you need a Pneumonia shot.  Take these steps  Don't smoke- If you do smoke, talk to your doctor about quitting.  For tips  on how to quit, go to www.smokefree.gov or call 1-800-QUIT-NOW.  Be physically active- Exercise 5 days a week for at least 30 minutes.  If you are not already physically active start slow and gradually work up to 30 minutes of moderate physical activity.  Examples of moderate activity include walking briskly, mowing the yard, dancing, swimming, bicycling, etc.  Eat a healthy diet- Eat a variety of healthy food such as fruits, vegetables, low fat milk, low fat cheese, yogurt, lean meant, poultry, fish, beans, tofu, etc. For more information go to www.thenutritionsource.org  Drink alcohol in moderation- Limit alcohol intake to less than two drinks a day. Never drink and drive.  Dentist- Brush and floss twice daily; visit your dentist twice a year.  Depression- Your emotional health is as important as your physical health. If you're feeling down, or losing interest in things you would normally enjoy please talk to your healthcare provider.  Eye exam- Visit your eye doctor every year.  Safe sex- If you may be exposed to a sexually transmitted infection, use a condom.  Seat belts- Seat belts can save your life; always wear one.  Smoke/Carbon Monoxide detectors- These detectors need to be installed on the appropriate level of your home.  Replace batteries at least once a year.  Skin cancer- When out in the sun, cover up and use sunscreen 15 SPF or higher.  Violence- If anyone is threatening you, please tell your healthcare provider.  Living Will/ Health care power of attorney- Speak with your healthcare provider and family.  If you  have lab work done today you will be contacted with your lab results within the next 2 weeks.  If you have not heard from [place] then please contact us. The fastest way to get your results is to register for My Chart.   IF you received an x-ray today, you will receive an invoice from [facility]. Please contact [facility] at [phone] with  questions or concerns regarding your invoice.   IF you received labwork today, you will receive an invoice from [facility]. Please contact [facility] at [phone] with questions or concerns regarding your invoice.   Our billing staff will not be able to assist you with questions regarding bills from these companies.  You will be contacted with the lab results as soon as they are available. The fastest way to get your results is to activate your My Chart account. Instructions are located on the last page of this paperwork. If you have not heard from [place] regarding the results in 2 weeks, please contact this office.

## 2018-01-06 NOTE — Progress Notes (Signed)
Subjective:    Patient ID: Adam Cunningham, male    DOB: 01/20/1961, 57 y.o.   MRN: [id number]  HPI Adam Cunningham is a 57 y.o. male Presents today for: Chief Complaint  Patient presents with  . Annual Exam    CPE   Gout: On allopurinol 300 mg daily.  Indomethacin as needed. Last flair few months ago. Less than 5 in past year. No new side effects on 329m dose.   Lab Results  Component Value Date   LABURIC 6.1 04/21/2017   Hyperlipidemia: Borderline ASCVD risk currently based on last year's readings, ASCVD risk was 5.9% last year.  Not on statins.  Lab Results  Component Value Date   CHOL 207 (H) 01/07/2017   HDL 39 (L) 01/07/2017   LDLCALC 112 (H) 01/07/2017   TRIG 279 (H) 01/07/2017   CHOLHDL 5.3 (H) 01/07/2017   Lab Results  Component Value Date   ALT 27 01/07/2017   AST 21 01/07/2017   ALKPHOS 63 01/07/2017   BILITOT 0.7 01/07/2017  The 10-year ASCVD risk score (Mikey BussingDC Jr., et al., 2013) is: 8%   Values used to calculate the score:     Age: [age]years     Sex: Male     Is Non-Hispanic African American: No     Diabetic: No     Tobacco smoker: No     Systolic Blood Pressure: 1935mmHg     Is BP treated: No     HDL Cholesterol: 39 mg/dL     Total Cholesterol: 207 mg/dL  Obesity: Wt Readings from Last 3 Encounters:  01/06/18 248 lb 3.2 oz (112.6 kg)  09/10/17 247 lb (112 kg)  07/17/17 241 lb (109.3 kg)   Body mass index is 32.75 kg/m. Planning on increasing exercise. Met with nutritionist 3 times.  Has noticed home weight down 10 pounds. (weighed 241 without clothes).  Has made some diet changes.  Elevated #s after beach trip in August.  No planned follow up with nutrition.  Sweet tea every day- mixes with water.   History of episodic low back pain: Has used Flexeril in the past, indomethacin as needed which is also been used for gout, rare hydrocodone.  Episodes few times per month using Flexeril when discussed prior.  Just refilled flexeril.  Only notes if doing more cutting - every few months.   Cancer screening: Colonoscopy 01/21/2009, repeat 10 years History of prostate cancer: Followed by urology, Dr. OConsuella Lose On recent urology eval needed.   Monitor PSA.  But if any concerns plans to follow back up with urology.no new urinary symptoms  Up to date on HIV/Hep C screening.   Immunization History  Administered Date(s) Administered  . Influenza Split 12/09/2011  . Influenza,inj,Quad PF,6+ Mos 12/08/2012, 12/14/2013, 12/27/2015, 01/07/2017, 01/06/2018  . Influenza-Unspecified 11/22/2014  . Tdap 10/25/2010     [facility] 01/06/2018 07/17/2017 06/11/2017 04/21/2017 03/06/2017  Decreased Interest 0 0 0 0 0  Down, Depressed, Hopeless 0 0 0 0 0  PHQ - 2 Score 0 0 0 0 0    Visual Acuity Screening   Right eye Left eye Both eyes  Without correction:     With correction: 20/20 20/20 20/20  optho visit last week.   Dentist:  Every 6 months.   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     Patient Active Problem List   Diagnosis Date Noted  . Hyperlipidemia 11/26/2011  .  Gout 11/26/2011  . Low testosterone 11/26/2011  . H/O prostate cancer 05/26/2007   Past Medical History:  Diagnosis Date  . Cancer [facility])    prostate   Past Surgical History:  Procedure Laterality Date  . BICEPS TENDON REPAIR    . PROSTATE SURGERY     No Known Allergies Prior to Admission medications   Medication Sig Start Date End Date Taking? Authorizing Provider  allopurinol (ZYLOPRIM) 300 MG tablet Take 1 tablet (300 mg total) by mouth daily. 11/28/17  Yes [name], MD  colchicine 0.6 MG tablet Take 1 tablet (0.6 mg total) by mouth daily. 09/30/15  Yes [name], MD  cyclobenzaprine (FLEXERIL) 5 MG tablet TAKE 1 TABLET BY MOUTH UP TO EVERY 8 HOURS AS NEEDED. START WITH 1 TABLET EACH NIGHT AT BEDTIME AS NEEDED DUE TO SEDATION 01/07/17  Yes [name], MD    HYDROcodone-acetaminophen (NORCO) 5-325 MG tablet Take 1 tablet by mouth every 6 (six) hours as needed for moderate pain. 12/27/15  Yes [name], MD  indomethacin (INDOCIN) 50 MG capsule Take 1 capsule (50 mg total) by mouth 2 (two) times daily with a meal. 12/27/13  Yes Daub, Loura Back, MD   Social History   Socioeconomic History  . Marital status: Married    Spouse name: Not on file  . Number of children: Not on file  . Years of education: Not on file  . Highest education level: Not on file  Occupational History  . 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 Occupation: Scientist, water quality  . Financial resource strain: Not on file  . Food insecurity:    Worry: Not on file    Inability: Not on file  . Transportation needs:    Medical: Not on file    Non-medical: Not on file  Tobacco Use  . Smoking status: Never Smoker  . Smokeless tobacco: Never Used  Substance and Sexual Activity  . Alcohol use: No  . Drug use: No  . Sexual activity: Yes  Lifestyle  . Physical activity:    Days per week: Not on file    Minutes per session: Not on file  . Stress: Not on file  Relationships  . Social connections:    Talks on phone: Not on file    Gets together: Not on file    Attends religious service: Not on file    Active member of club or organization: Not on file    Attends meetings of clubs or organizations: Not on file    Relationship status: Not on file  . Intimate partner violence:    Fear of current or ex partner: Not on file    Emotionally abused: Not on file    Physically abused: Not on file    Forced sexual activity: Not on file  Other Topics Concern  . 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   Social research officer, government   Education: [employer]   Exercise: Yes    Review of Systems 13 point review of systems per patient health survey noted.  Negative other than as indicated above or in HPI.      Objective:   Physical Exam Vitals signs reviewed.   Constitutional:      Appearance: He is well-developed.  HENT:     Head: Normocephalic and atraumatic.     Right Ear: External ear normal.     Left Ear: External ear normal.  Eyes:  Conjunctiva/sclera: Conjunctivae normal.     Pupils: Pupils are equal, round, and reactive to light.  Neck:     Musculoskeletal: Normal range of motion and neck supple.     Thyroid: No thyromegaly.  Cardiovascular:     Rate and Rhythm: Normal rate and regular rhythm.     Heart sounds: Normal heart sounds.  Pulmonary:     Effort: Pulmonary effort is normal. No respiratory distress.     Breath sounds: Normal breath sounds. No wheezing.  Abdominal:     General: There is no distension.     Palpations: Abdomen is soft.     Tenderness: There is no abdominal tenderness.     Hernia: There is no hernia in the right inguinal area or left inguinal area.  Genitourinary:    Prostate: Normal.  Musculoskeletal: Normal range of motion.        General: No tenderness.  Lymphadenopathy:     Cervical: No cervical adenopathy.  Skin:    General: Skin is warm and dry.  Neurological:     Mental Status: He is alert and oriented to person, place, and time.     Deep Tendon Reflexes: Reflexes are normal and symmetric.  Psychiatric:        Behavior: Behavior normal.    Vitals:   01/06/18 1348  BP: 118/83  Pulse: 75  Temp: 98.3 F (36.8 C)  TempSrc: Oral  SpO2: 96%  Weight: 248 lb 3.2 oz (112.6 kg)  Height: 6' 1" (1.854 m)       Assessment & Plan:  Adam Cunningham is a 57 y.o. male Annual physical exam - Plan: Flu Vaccine QUAD 36+ mos IM  - -anticipatory guidance as below in AVS, screening labs above. Health maintenance items as above in HPI discussed/recommended as applicable.   -Weight management discussed with recheck in 6 months.  Hyperlipidemia, unspecified hyperlipidemia type - Plan: Comprehensive metabolic panel, Lipid panel  -Borderline ASCVD risk previously, check labs.  Diet and exercise  discussed.  Gout, unspecified cause, unspecified chronicity, unspecified site - Plan: Uric acid  -Stable on current dose of allopurinol, check uric acid level.  Need for influenza vaccination  Screening for prostate cancer - Plan: PSA  -History of prostate cancer, check PSA for ongoing monitoring, DRE deferred given prior prostatectomy.  No orders of the defined types were placed in this encounter.  Patient Instructions   Follow up in 6 months to recheck weight and possible labs then.  Increase activity and watch diet especially avoiding sugar containing beverages for weight loss. Let me know if there are questions.   Cholesterol only borderline level of recommendations for statins.  I will recheck labs today and we can discuss those recommendations further.  Weight loss with diet and exercise should also help those numbers.  Thank you for coming in today.  Keeping you healthy  Get these tests  Blood pressure- Have your blood pressure checked once a year by your healthcare provider.  Normal blood pressure is 120/80  Weight- Have your body mass index (BMI) calculated to screen for obesity.  BMI is a measure of body fat based on height and weight. You can also calculate your own BMI at [employer]Bank[employer][email or web address].  Cholesterol- Have your cholesterol checked every year.  Diabetes- Have your blood sugar checked regularly if you have high blood pressure, high cholesterol, have a family history of diabetes or if you are overweight.  Screening for Colon Cancer- Colonoscopy starting at age 79.  Screening may begin sooner  depending on your family history and other health conditions. Follow up colonoscopy as directed by your Gastroenterologist.  Screening for Prostate Cancer- Both blood work (PSA) and a rectal exam help screen for Prostate Cancer.  Screening begins at age 54 with African-American men and at age 80 with Caucasian men.  Screening may begin sooner depending on your family  history.  Take these medicines  Aspirin- One aspirin daily can help prevent Heart disease and Stroke.  Flu shot- Every fall.  Tetanus- Every 10 years.  Zostavax- Once after the age of 71 to prevent Shingles.  Pneumonia shot- Once after the age of 32; if you are younger than 3, ask your healthcare provider if you need a Pneumonia shot.  Take these steps  Don't smoke- If you do smoke, talk to your doctor about quitting.  For tips on how to quit, go to www.smokefree.gov or call 1-800-QUIT-NOW.  Be physically active- Exercise 5 days a week for at least 30 minutes.  If you are not already physically active start slow and gradually work up to 30 minutes of moderate physical activity.  Examples of moderate activity include walking briskly, mowing the yard, dancing, swimming, bicycling, etc.  Eat a healthy diet- Eat a variety of healthy food such as fruits, vegetables, low fat milk, low fat cheese, yogurt, lean meant, poultry, fish, beans, tofu, etc. For more information go to www.thenutritionsource.org  Drink alcohol in moderation- Limit alcohol intake to less than two drinks a day. Never drink and drive.  Dentist- Brush and floss twice daily; visit your dentist twice a year.  Depression- Your emotional health is as important as your physical health. If you're feeling down, or losing interest in things you would normally enjoy please talk to your healthcare provider.  Eye exam- Visit your eye doctor every year.  Safe sex- If you may be exposed to a sexually transmitted infection, use a condom.  Seat belts- Seat belts can save your life; always wear one.  Smoke/Carbon Monoxide detectors- These detectors need to be installed on the appropriate level of your home.  Replace batteries at least once a year.  Skin cancer- When out in the sun, cover up and use sunscreen 15 SPF or higher.  Violence- If anyone is threatening you, please tell your healthcare provider.  Living Will/ Health care  power of attorney- Speak with your healthcare provider and family.  If you have lab work done today you will be contacted with your lab results within the next 2 weeks.  If you have not heard from [place] then please contact us. The fastest way to get your results is to register for My Chart.   IF you received an x-ray today, you will receive an invoice from [facility]. Please contact [facility]iology at [phone] with questions or concerns regarding your invoice.   IF you received labwork today, you will receive an invoice from [facility]. Please contact [facility] at [phone] with questions or concerns regarding your invoice.   Our billing staff will not be able to assist you with questions regarding bills from these companies.  You will be contacted with the lab results as soon as they are available. The fastest way to get your results is to activate your My Chart account. Instructions are located on the last page of this paperwork. If you have not heard from [place] regarding the results in 2 weeks, please contact this office.       Signed,   Merri Ray, MD [facility] at  [facility].  01/12/18 9:09 AM

## 2018-01-07 LAB — COMPREHENSIVE METABOLIC PANEL
A/G RATIO: 1.9 (ref 1.2–2.2)
ALT: 18 IU/L (ref 0–44)
AST: 18 IU/L (ref 0–40)
Albumin: 4.7 g/dL (ref 3.5–5.5)
Alkaline Phosphatase: 65 IU/L (ref 39–117)
BUN/Creatinine Ratio: 14 (ref 9–20)
BUN: 13 mg/dL (ref 6–24)
Bilirubin Total: 0.7 mg/dL (ref 0.0–1.2)
CALCIUM: 9.6 mg/dL (ref 8.7–10.2)
CO2: 25 mmol/L (ref 20–29)
CREATININE: 0.93 mg/dL (ref 0.76–1.27)
Chloride: 100 mmol/L (ref 96–106)
GFR, EST AFRICAN AMERICAN: 104 mL/min/{1.73_m2} (ref >59)
GFR, EST NON AFRICAN AMERICAN: 90 mL/min/{1.73_m2} (ref >59)
GLOBULIN, TOTAL: 2.5 g/dL (ref 1.5–4.5)
Glucose: 86 mg/dL (ref 65–99)
Potassium: 4.2 mmol/L (ref 3.5–5.2)
Sodium: 142 mmol/L (ref 134–144)
TOTAL PROTEIN: 7.2 g/dL (ref 6.0–8.5)

## 2018-01-07 LAB — LIPID PANEL
CHOL/HDL RATIO: 4.8 ratio (ref 0.0–5.0)
Cholesterol, Total: 196 mg/dL (ref 100–199)
HDL: 41 mg/dL (ref >39)
LDL Calculated: 103 mg/dL — ABNORMAL HIGH (ref 0–99)
TRIGLYCERIDES: 259 mg/dL — AB (ref 0–149)
VLDL Cholesterol Cal: 52 mg/dL — ABNORMAL HIGH (ref 5–40)

## 2018-01-07 LAB — URIC ACID: URIC ACID: 6.7 mg/dL (ref 3.7–8.6)

## 2018-01-07 LAB — PSA: Prostate Specific Ag, Serum: <0.1 ng/mL (ref 0.0–4.0)

## 2018-03-16 ENCOUNTER — Telehealth: Payer: Self-pay | Admitting: Family Medicine

## 2018-03-16 NOTE — Telephone Encounter (Signed)
Copied from [place] [phone]. Topic: Appointment Scheduling - Scheduling Inquiry for Clinic >> Mar 16, 2018  9:32 AM Margot Ables wrote: Reason for CRM: Pt is requesting sooner appt with Dr. Carlota Raspberry. He is scheduled for 04/06/2018. He is having pain in left shoulder and it is waking him up at night. Pt notes having injection in December 2019 and wanting Dr. Carlota Raspberry to give another injection. If preferred he would be willing to go to orthopedic. If referral please send to [place] (Dr. Arther Abbott) ph # [phone]. Pt said Dr. Carlota Raspberry did a great job so he is needing advice but really hoping for appt prior to 3/16 with Dr. Carlota Raspberry or Ortho.   Please call pt at [phone].

## 2018-03-16 NOTE — Telephone Encounter (Signed)
Can patient come in any sooner? isnt there a time frame for when those shots have to be given.  Let me know what to do with scheduling him.

## 2018-03-17 NOTE — Telephone Encounter (Signed)
Patient was informed and transferred to the front desk to get a same day appt with Dr Carlota Raspberry

## 2018-03-17 NOTE — Telephone Encounter (Signed)
Please advise would you like for the patient to come in sooner or refer to Ortho

## 2018-03-17 NOTE — Telephone Encounter (Signed)
I am sorry to hear his shoulder is hurting again.  Looking back it appears his shoulder injection was actually back in April, not December.  I am happy to see him sooner if we want to slide him into a same-day visit for shoulder pain only and can decide if another injection at that time may be helpful.  I am also happy to refer him to Ortho if he would prefer.  Let me know.

## 2018-03-24 ENCOUNTER — Encounter: Payer: Self-pay | Admitting: Family Medicine

## 2018-03-24 ENCOUNTER — Other Ambulatory Visit: Payer: Self-pay

## 2018-03-24 ENCOUNTER — Ambulatory Visit (INDEPENDENT_AMBULATORY_CARE_PROVIDER_SITE_OTHER): Payer: No Typology Code available for payment source | Admitting: Family Medicine

## 2018-03-24 VITALS — BP 119/74 | HR 69 | Temp 98.4°F | Resp 16 | Ht 73.0 in | Wt 252.6 lb

## 2018-03-24 DIAGNOSIS — M25512 Pain in left shoulder: Secondary | ICD-10-CM | POA: Diagnosis not present

## 2018-03-24 DIAGNOSIS — M7542 Impingement syndrome of left shoulder: Secondary | ICD-10-CM

## 2018-03-24 DIAGNOSIS — M109 Gout, unspecified: Secondary | ICD-10-CM | POA: Diagnosis not present

## 2018-03-24 DIAGNOSIS — M1A9XX0 Chronic gout, unspecified, without tophus (tophi): Secondary | ICD-10-CM

## 2018-03-24 MED ORDER — ALLOPURINOL 300 MG PO TABS: 300.0000 mg | ORAL_TABLET | Freq: Every day | ORAL | 2 refills | Status: DC

## 2018-03-24 MED FILL — ALLOPURINOL 300 MG TABS: 300 | 90 days supply | Qty: 90 | Fill #0

## 2018-03-24 NOTE — Progress Notes (Signed)
Subjective:    Patient ID: Adam Cunningham, male    DOB: [birth year], 59 y.o.   MRN: [id number]  HPI Adam Cunningham is a 58 y.o. male Presents today for: Chief Complaint  Patient presents with  . Shoulder Pain    left shoulder, was last seen here 01/06/2018 need to get another injection in the left shoulder  . Gout    need a refill on allopurinol   Gout: No recent flair.  On allopurinol 300mg  per day. Would like to stay at that dose.  Lab Results  Component Value Date   LABURIC 6.7 01/06/2018   L shoulder pain: Component of rotator cuff tendinitis as well as some impingement or subacromial bursitis.  Tried meloxicam and Tylenol, range of motion but that was not providing relief. Subacromial injection 04/21/17. Helped pain and range of motion until about a month ago when more soreness, and some pain with certain point of motion. Tried some samples of flector patch - helped minimal with range of motion. 5 out of 7 days for a few weeks. Still some trouble using it, still sore. Waking up at night at times.    Review of Systems As above.     Objective:   Physical Exam Constitutional:      General: He is not in acute distress.    Appearance: He is well-developed.  HENT:     Head: Normocephalic and atraumatic.  Cardiovascular:     Rate and Rhythm: Normal rate.  Pulmonary:     Effort: Pulmonary effort is normal.  Musculoskeletal:     Left shoulder: He exhibits decreased range of motion (limited to 100 flex/abd active, but full passive. pos Neer. ). He exhibits no tenderness (no AC/Raysal/clavicle ttp. no focal ttp. ), no bony tenderness, normal pulse and normal strength (full rtc strength. ).     Cervical back: Normal. He exhibits normal range of motion (does not reproduce shoulder pain), no tenderness and no bony tenderness.  Neurological:     Mental Status: He is alert and oriented to person, place, and time.    Risks (including but not limited to bleeding and infection),  benefits, and alternatives discussed for L subacromial injection.  Posterior approach,  verbal consent obtained after any questions were answered. Landmarks noted, and marked as needed. Area cleansed with Betadine x3, ethyl chloride spray for topical anesthesia, followed by alcohol swab.  Injected with 1cc kenalog 40 mg/mL, 3cc lidocaine 1% plain.  No complications. Bandage applied.  RTC precautions discussed in regards to injection.   No complications. Slight better few mins after injection. NVI distally.     Assessment & Plan:    Adam Cunningham is a 58 y.o. male Pain in joint of left shoulder Impingement syndrome of shoulder region, left  -Mechanical tendinosis/impingement syndrome likely based on exam.  Improvement with previous corticosteroid injection last year.  Repeat treatment options discussed including trial of corticosteroid injection, referral to orthopedics, or physical therapy.  Minimal improvement with over-the-counter treatments.  Chose injection as above.  Injected without complications, some relief of symptoms.  Aftercare discussed, RTC precautions given, Ortho referral if persistent symptoms  Gout, unspecified cause, unspecified chronicity, unspecified site - Plan: allopurinol (ZYLOPRIM) 300 MG tablet Chronic gout without tophus, unspecified cause, unspecified site  -stable, continue same dose allopurinol.   Meds ordered this encounter  Medications  . allopurinol (ZYLOPRIM) 300 MG tablet    Sig: Take 1 tablet (300 mg total) by mouth daily.    Dispense:  90 tablet    Refill:  2    Pt changed pharmacy to [facility]   Patient Instructions       If you have lab work done today you will be contacted with your lab results within the next 2 weeks.  If you have not heard from [place] then please contact us. The fastest way to get your results is to register for My Chart.  Range of motion initially ok. See other info below.   Shoulder Impingement Syndrome  Shoulder  impingement syndrome is a condition that causes pain when connective tissues (tendons) surrounding the shoulder joint become pinched. These tendons are part of the group of muscles and tissues that help to stabilize the shoulder (rotator cuff). Beneath the rotator cuff is a fluid-filled sac (bursa) that allows the muscles and tendons to glide smoothly. The bursa may become swollen or irritated (bursitis). Bursitis, swelling in the rotator cuff tendons, or both conditions can decrease how much space is under a bone in the shoulder joint (acromion), resulting in impingement. What are the causes? Shoulder impingement syndrome may be caused by bursitis or swelling of the rotator cuff tendons, which may result from:  Repetitive overhead arm movements.  Falling onto the shoulder.  Weakness in the shoulder muscles. What increases the risk? You may be more likely to develop this condition if you:  Play sports that involve throwing, such as baseball.  Participate in sports such as tennis, volleyball, and swimming.  Work as a Curator, Games developer, or Architect. Some people are also more likely to develop impingement syndrome because of the shape of their acromion bone. What are the signs or symptoms? The main symptom of this condition is pain on the front or side of the shoulder. The pain may:  Get worse when lifting or raising the arm.  Get worse at night.  Wake you up from sleeping.  Feel sharp when the shoulder is moved and then fade to an ache. Other symptoms may include:  Tenderness.  Stiffness.  Inability to raise the arm above shoulder level or behind the body.  Weakness. How is this diagnosed? This condition may be diagnosed based on:  Your symptoms and medical history.  A physical exam.  Imaging tests, such as: ? X-rays. ? MRI. ? Ultrasound. How is this treated? This condition may be treated by:  Resting your shoulder and avoiding all activities that cause pain or  put stress on the shoulder.  Icing your shoulder.  NSAIDs to help reduce pain and swelling.  One or more injections of medicines to numb the area and reduce inflammation.  Physical therapy.  Surgery. This may be needed if nonsurgical treatments have not helped. Surgery may involve repairing the rotator cuff, reshaping the acromion, or removing the bursa. Follow these instructions at home: Managing pain, stiffness, and swelling   If directed, put ice on the injured area. ? Put ice in a plastic bag. ? Place a towel between your skin and the bag. ? Leave the ice on for 20 minutes, 2-3 times a day. Activity  Rest and return to your normal activities as told by your health care provider. Ask your health care provider what activities are safe for you.  Do exercises as told by your health care provider. General instructions  Do not use any products that contain nicotine or tobacco, such as cigarettes, e-cigarettes, and chewing tobacco. These can delay healing. If you need help quitting, ask your health care provider.  Ask your  health care provider when it is safe for you to drive.  Take over-the-counter and prescription medicines only as told by your health care provider.  Keep all follow-up visits as told by your health care provider. This is important. How is this prevented?  Give your body time to rest between periods of activity.  Be safe and responsible while being active. This will help you avoid falls.  Maintain physical fitness, including strength and flexibility. Contact a health care provider if:  Your symptoms have not improved after 1-2 months of treatment and rest.  You cannot lift your arm away from your body. Summary  Shoulder impingement syndrome is a condition that causes pain when connective tissues (tendons) surrounding the shoulder joint become pinched.  The main symptom of this condition is pain on the front or side of the shoulder.  This condition is  usually treated with rest, ice, and pain medicines as needed. This information is not intended to replace advice given to you by your health care provider. Make sure you discuss any questions you have with your health care provider. Document Released: 01/07/2005 Document Revised: 07/02/2017 Document Reviewed: 07/02/2017 Elsevier Interactive Patient Education  2019 [employer].      IF you received an x-ray today, you will receive an invoice from [facility]. Please contact [facility] at [phone] with questions or concerns regarding your invoice.   IF you received labwork today, you will receive an invoice from [place]. Please contact [facility] at [phone] with questions or concerns regarding your invoice.   Our billing staff will not be able to assist you with questions regarding bills from these companies.  You will be contacted with the lab results as soon as they are available. The fastest way to get your results is to activate your My Chart account. Instructions are located on the last page of this paperwork. If you have not heard from [place] regarding the results in 2 weeks, please contact this office.       Signed,   Merri Ray, MD [facility] [place].  03/24/18 10:44 PM

## 2018-03-24 NOTE — Patient Instructions (Addendum)
If you have lab work done today you will be contacted with your lab results within the next 2 weeks.  If you have not heard from [place] then please contact us. The fastest way to get your results is to register for My Chart.  Range of motion initially ok. See other info below.   Shoulder Impingement Syndrome  Shoulder impingement syndrome is a condition that causes pain when connective tissues (tendons) surrounding the shoulder joint become pinched. These tendons are part of the group of muscles and tissues that help to stabilize the shoulder (rotator cuff). Beneath the rotator cuff is a fluid-filled sac (bursa) that allows the muscles and tendons to glide smoothly. The bursa may become swollen or irritated (bursitis). Bursitis, swelling in the rotator cuff tendons, or both conditions can decrease how much space is under a bone in the shoulder joint (acromion), resulting in impingement. What are the causes? Shoulder impingement syndrome may be caused by bursitis or swelling of the rotator cuff tendons, which may result from:  Repetitive overhead arm movements.  Falling onto the shoulder.  Weakness in the shoulder muscles. What increases the risk? You may be more likely to develop this condition if you:  Play sports that involve throwing, such as baseball.  Participate in sports such as tennis, volleyball, and swimming.  Work as a Curator, Games developer, or Architect. Some people are also more likely to develop impingement syndrome because of the shape of their acromion bone. What are the signs or symptoms? The main symptom of this condition is pain on the front or side of the shoulder. The pain may:  Get worse when lifting or raising the arm.  Get worse at night.  Wake you up from sleeping.  Feel sharp when the shoulder is moved and then fade to an ache. Other symptoms may include:  Tenderness.  Stiffness.  Inability to raise the arm above shoulder level or behind the  body.  Weakness. How is this diagnosed? This condition may be diagnosed based on:  Your symptoms and medical history.  A physical exam.  Imaging tests, such as: ? X-rays. ? MRI. ? Ultrasound. How is this treated? This condition may be treated by:  Resting your shoulder and avoiding all activities that cause pain or put stress on the shoulder.  Icing your shoulder.  NSAIDs to help reduce pain and swelling.  One or more injections of medicines to numb the area and reduce inflammation.  Physical therapy.  Surgery. This may be needed if nonsurgical treatments have not helped. Surgery may involve repairing the rotator cuff, reshaping the acromion, or removing the bursa. Follow these instructions at home: Managing pain, stiffness, and swelling   If directed, put ice on the injured area. ? Put ice in a plastic bag. ? Place a towel between your skin and the bag. ? Leave the ice on for 20 minutes, 2-3 times a day. Activity  Rest and return to your normal activities as told by your health care provider. Ask your health care provider what activities are safe for you.  Do exercises as told by your health care provider. General instructions  Do not use any products that contain nicotine or tobacco, such as cigarettes, e-cigarettes, and chewing tobacco. These can delay healing. If you need help quitting, ask your health care provider.  Ask your health care provider when it is safe for you to drive.  Take over-the-counter and prescription medicines only as told by your health care provider.  Keep all follow-up visits as told by your health care provider. This is important. How is this prevented?  Give your body time to rest between periods of activity.  Be safe and responsible while being active. This will help you avoid falls.  Maintain physical fitness, including strength and flexibility. Contact a health care provider if:  Your symptoms have not improved after 1-2  months of treatment and rest.  You cannot lift your arm away from your body. Summary  Shoulder impingement syndrome is a condition that causes pain when connective tissues (tendons) surrounding the shoulder joint become pinched.  The main symptom of this condition is pain on the front or side of the shoulder.  This condition is usually treated with rest, ice, and pain medicines as needed. This information is not intended to replace advice given to you by your health care provider. Make sure you discuss any questions you have with your health care provider. Document Released: 01/07/2005 Document Revised: 07/02/2017 Document Reviewed: 07/02/2017 Elsevier Interactive Patient Education  2019 [employer].      IF you received an x-ray today, you will receive an invoice from [facility]. Please contact [facility] Radiology at [phone] with questions or concerns regarding your invoice.   IF you received labwork today, you will receive an invoice from [place]. Please contact [facility] at [phone] with questions or concerns regarding your invoice.   Our billing staff will not be able to assist you with questions regarding bills from these companies.  You will be contacted with the lab results as soon as they are available. The fastest way to get your results is to activate your My Chart account. Instructions are located on the last page of this paperwork. If you have not heard from [place] regarding the results in 2 weeks, please contact this office.

## 2018-04-03 ENCOUNTER — Encounter: Payer: Self-pay | Admitting: Family Medicine

## 2018-04-03 DIAGNOSIS — M25512 Pain in left shoulder: Secondary | ICD-10-CM

## 2018-04-10 ENCOUNTER — Other Ambulatory Visit: Payer: Self-pay

## 2018-04-10 ENCOUNTER — Ambulatory Visit (INDEPENDENT_AMBULATORY_CARE_PROVIDER_SITE_OTHER): Payer: No Typology Code available for payment source | Admitting: Orthopedic Surgery

## 2018-04-10 ENCOUNTER — Encounter

## 2018-04-10 ENCOUNTER — Ambulatory Visit: Payer: No Typology Code available for payment source | Admitting: Family Medicine

## 2018-04-10 VITALS — BP 132/88 | HR 68 | Ht 73.0 in | Wt 238.0 lb

## 2018-04-10 DIAGNOSIS — M75111 Incomplete rotator cuff tear or rupture of right shoulder, not specified as traumatic: Secondary | ICD-10-CM | POA: Diagnosis not present

## 2018-04-10 DIAGNOSIS — M7522 Bicipital tendinitis, left shoulder: Secondary | ICD-10-CM | POA: Diagnosis not present

## 2018-04-10 NOTE — Progress Notes (Signed)
NEW PATIENT OFFICE VISIT  Chief Complaint  Patient presents with  . Shoulder Pain    Left shoulder pain.    58 YO MALE PRESENTS FOR EVALUATION OF LEFT SHOULDER:  He c/o 2 month h/o aching anterior left shoulder pain, painful forward elevation, with no h/o trauma unrelieved by Ibuprofen, subacromial injection, and codman's exercises. He says his main problem is trying to hang out the laundry.   He initially had pain closing the car door.   Review of Systems  Constitutional: Negative for chills and fever.  Musculoskeletal: Negative for neck pain.  Skin: Negative.   Neurological: Positive for weakness. Negative for tingling and focal weakness.     Past Medical History:  Diagnosis Date  . Cancer [facility])    prostate    Past Surgical History:  Procedure Laterality Date  . BICEPS TENDON REPAIR    . PROSTATE SURGERY      Family History  Problem Relation Age of Onset  . Cancer Mother        lung  . Cancer Father        prostate ca  . Cirrhosis Father        non-alcoholic  . Cancer Maternal Grandfather        Prostate   Social History   Tobacco Use  . Smoking status: Never Smoker  . Smokeless tobacco: Never Used  Substance Use Topics  . Alcohol use: No  . Drug use: No    No Known Allergies  Current Meds  Medication Sig  . allopurinol (ZYLOPRIM) 300 MG tablet Take 1 tablet (300 mg total) by mouth daily.  . colchicine 0.6 MG tablet Take 1 tablet (0.6 mg total) by mouth daily.  . cyclobenzaprine (FLEXERIL) 5 MG tablet TAKE 1 TABLET BY MOUTH UP TO EVERY 8 HOURS AS NEEDED. START WITH 1 TABLET EACH NIGHT AT BEDTIME AS NEEDED DUE TO SEDATION    BP 132/88   Pulse 68   Ht 6\' 1"  (1.854 m)   Wt 238 lb (108 kg)   BMI 31.40 kg/m   Physical Exam Vitals signs and nursing note reviewed.  Constitutional:      Appearance: Normal appearance.  Neck:     Musculoskeletal: Normal range of motion. No neck rigidity or muscular tenderness.  Cardiovascular:     Pulses:  Normal pulses.  Lymphadenopathy:     Cervical: No cervical adenopathy.  Neurological:     Mental Status: He is alert and oriented to person, place, and time.  Psychiatric:        Mood and Affect: Mood normal.        Behavior: Behavior normal.        Thought Content: Thought content normal.        Judgment: Judgment normal.     Right Shoulder Exam  Right shoulder exam is normal.  Tenderness  The patient is experiencing no tenderness.  Range of Motion  The patient has normal right shoulder ROM.  Muscle Strength  The patient has normal right shoulder strength.  Tests  Apprehension: negative  Other  Erythema: absent Sensation: normal Pulse: present   Left Shoulder Exam   Tenderness  The patient is experiencing tenderness in the biceps tendon (anterolateral deltoid ).  Range of Motion  Active abduction: normal  Passive abduction: normal  Extension: normal  External rotation: normal  Forward flexion:  90 (active 90 passive 150) abnormal  Internal rotation 0 degrees: normal   Muscle Strength  The patient has normal left shoulder  strength. Abduction: 5/5  Internal rotation: 5/5  External rotation: 5/5  Supraspinatus: 4/5  Subscapularis: 5/5  Biceps: 5/5   Tests  Apprehension: negative Hawkins test: negative Cross arm: negative Impingement: positive Drop arm: negative Sulcus: absent  Other  Erythema: absent Sensation: normal Pulse: present         MEDICAL DECISION SECTION  Xrays were done at [facility] CLINICAL DATA:  Left shoulder pain.   EXAM: LEFT SHOULDER - 2+ VIEW   COMPARISON:  04/29/2014.   FINDINGS: Acromioclavicular and glenohumeral degenerative change. No evidence of fracture or dislocation.   IMPRESSION: Degenerative changes left shoulder.  No acute abnormality.     Electronically Signed   By: Marcello Moores  Register   On: 03/06/2017 11:47   My independent reading of xrays:  3 views of the shoulder no fracture dislocation or bony  erosions no joint space narrowing, humerus is in appropriate position.  No posterior glenoid erosion  Impression normal shoulder x-ray  Encounter Diagnoses  Name Primary?  . Tendonitis of upper biceps tendon of left shoulder Yes  . Nontraumatic incomplete tear of right rotator cuff     PLAN: (Rx., injectx, surgery, frx, mri/ct) Continue IBUPROFEN  START HEP RC STRENGTHENING 2 INJECTIONS:  1. Procedure note the subacromial injection shoulder left   Verbal consent was obtained to inject the  Left   Shoulder  Timeout was completed to confirm the injection site is a subacromial space of the  left  shoulder  Medication used Depo-Medrol 40 mg and lidocaine 1% 3 cc  Anesthesia was provided by ethyl chloride  The injection was performed in the left  posterior subacromial space. After pinning the skin with alcohol and anesthetized the skin with ethyl chloride the subacromial space was injected using a 20-gauge needle. There were no complications  Sterile dressing was applied.   2 . Procedure note biceps tendon injection Left biceps tendon was injected The patient gave verbal consent for cortisone injection Timeout confirmed the site of injection Medications used included 40 mg of Depo-Medrol and 3 mL 1% lidocaine After alcohol and ethyl chloride preparation the point of maximal tenderness was injected over the left  biceps tendon there were no complications   No orders of the defined types were placed in this encounter.   Arther Abbott, MD  04/10/2018 11:15 AM

## 2018-06-22 MED FILL — ALLOPURINOL 300 MG TAB: 300 | 90 days supply | Qty: 90 | Fill #1

## 2018-08-17 ENCOUNTER — Encounter: Payer: Self-pay | Admitting: Gastroenterology

## 2018-09-15 MED FILL — ALLOPURINOL 300 MG TAB: 300 | 90 days supply | Qty: 90 | Fill #2

## 2018-09-16 ENCOUNTER — Encounter: Payer: Self-pay | Admitting: Family Medicine

## 2018-09-21 ENCOUNTER — Other Ambulatory Visit: Payer: Self-pay | Admitting: *Deleted

## 2018-09-21 DIAGNOSIS — Z1211 Encounter for screening for malignant neoplasm of colon: Secondary | ICD-10-CM

## 2018-10-01 ENCOUNTER — Encounter: Payer: Self-pay | Admitting: Family Medicine

## 2018-11-05 MED FILL — FLUARIX QUADRIVALENT 0.5 ML: 0.5 | 1 days supply | Qty: 1 | Fill #0

## 2018-11-24 ENCOUNTER — Ambulatory Visit (INDEPENDENT_AMBULATORY_CARE_PROVIDER_SITE_OTHER): Payer: No Typology Code available for payment source | Admitting: Registered Nurse

## 2018-11-24 ENCOUNTER — Ambulatory Visit (AMBULATORY_SURGERY_CENTER): Payer: No Typology Code available for payment source | Admitting: *Deleted

## 2018-11-24 ENCOUNTER — Encounter: Payer: Self-pay | Admitting: Gastroenterology

## 2018-11-24 ENCOUNTER — Encounter: Payer: Self-pay | Admitting: Registered Nurse

## 2018-11-24 ENCOUNTER — Other Ambulatory Visit: Payer: Self-pay

## 2018-11-24 VITALS — BP 117/79 | HR 71 | Temp 98.5°F | Resp 16 | Ht 71.0 in | Wt 247.0 lb

## 2018-11-24 VITALS — Temp 96.9°F | Ht 73.0 in | Wt 241.2 lb

## 2018-11-24 DIAGNOSIS — Z1211 Encounter for screening for malignant neoplasm of colon: Secondary | ICD-10-CM

## 2018-11-24 DIAGNOSIS — Z1159 Encounter for screening for other viral diseases: Secondary | ICD-10-CM

## 2018-11-24 DIAGNOSIS — H60392 Other infective otitis externa, left ear: Secondary | ICD-10-CM | POA: Diagnosis not present

## 2018-11-24 MED ORDER — NA SULFATE-K SULFATE-MG SULF 17.5-3.13-1.6 GM/177ML PO SOLN: 1.0000 | Freq: Once | ORAL | 0 refills | Status: AC

## 2018-11-24 MED ORDER — CIPROFLOXACIN-FLUOCINOLONE PF 0.3-0.025 % OT SOLN: 0.2500 mL | Freq: Two times a day (BID) | OTIC | 0 refills | Status: DC

## 2018-11-24 MED FILL — CIPROFLOXACIN-FLUOCINOLONE: 0.3-0.025 | 7 days supply | Qty: 14 | Fill #0

## 2018-11-24 MED FILL — SUPREP BOWEL PREP KIT: 17.5-3.13-1 | 2 days supply | Qty: 354 | Fill #0

## 2018-11-24 NOTE — Progress Notes (Signed)
No egg or soy allergy known to patient  No issues with past sedation with any surgeries  or procedures, no intubation problems  No diet pills per patient No home 02 use per patient  No blood thinners per patient  Pt denies issues with constipation  No A fib or A flutter  EMMI video sent to pt's e mail   Due to the COVID-19 pandemic we are asking patients to follow these guidelines. Please only bring one care partner. Please be aware that your care partner may wait in the car in the parking lot or if they feel like they will be too hot to wait in the car, they may wait in the lobby on the 4th floor. All care partners are required to wear a mask the entire time (we do not have any that we can provide them), they need to practice social distancing, and we will do a Covid check for all patient's and care partners when you arrive. Also we will check their temperature and your temperature. If the care partner waits in their car they need to stay in the parking lot the entire time and we will call them on their cell phone when the patient is ready for discharge so they can bring the car to the front of the building. Also all patient's will need to wear a mask into building.  covid screening 12/03/18, 0930

## 2018-11-24 NOTE — Progress Notes (Signed)
Acute Office Visit  Subjective:    Patient ID: Adam Cunningham, male    DOB: 10-20-1960, 58 y.o.   MRN: [id number]  Chief Complaint  Patient presents with  . Ear Pain    with drainage ( left ear)     HPI Patient is in today for ear pain w drainage  Onset 2-3 weeks prior to visit. Notes that he has crusting discharge from L ear. Mild to no drainage from R ear. Notes that when he lies on his L side, he feels some pressure in his ear. Has tried hydrogen peroxide rinse without success. No recent URI. No recent abx use. States "feels like swimmer's ear".   Past Medical History:  Diagnosis Date  . Cancer [facility])    prostate    Past Surgical History:  Procedure Laterality Date  . BICEPS TENDON REPAIR    . PROSTATE SURGERY      Family History  Problem Relation Age of Onset  . Cancer Mother        lung  . Cancer Father        prostate ca  . Cirrhosis Father        non-alcoholic  . Cancer Maternal Grandfather        Prostate  . Colon cancer Neg Hx   . Colon polyps Neg Hx   . Esophageal cancer Neg Hx   . Rectal cancer Neg Hx   . Stomach cancer Neg Hx     Social History   Socioeconomic History  . Marital status: Married    Spouse name: Not on file  . Number of children: Not on file  . Years of education: Not on file  . Highest education level: Not on file  Occupational History  . 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 Occupation: Scientist, water quality  . Financial resource strain: Not on file  . Food insecurity    Worry: Not on file    Inability: Not on file  . Transportation needs    Medical: Not on file    Non-medical: Not on file  Tobacco Use  . Smoking status: Never Smoker  . Smokeless tobacco: Never Used  Substance and Sexual Activity  . Alcohol use: No  . Drug use: No  . Sexual activity: Yes  Lifestyle  . Physical activity    Days per week: Not on file    Minutes per session: Not on file  . Stress: Not on file  Relationships  . Social Product manager on phone: Not on file    Gets together: Not on file    Attends religious service: Not on file    Active member of club or organization: Not on file    Attends meetings of clubs or organizations: Not on file    Relationship status: Not on file  . Intimate partner violence    Fear of current or ex partner: Not on file    Emotionally abused: Not on file    Physically abused: Not on file    Forced sexual activity: Not on file  Other Topics Concern  . 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   Social research officer, government   Education: [employer]   Exercise: Yes    Outpatient Medications Prior to Visit  Medication Sig Dispense Refill  . allopurinol (ZYLOPRIM) 300 MG tablet Take 1 tablet (300 mg total) by mouth daily. 90 tablet 2  . colchicine 0.6 MG tablet  Take 1 tablet (0.6 mg total) by mouth daily. 60 tablet 1  . cyclobenzaprine (FLEXERIL) 5 MG tablet TAKE 1 TABLET BY MOUTH UP TO EVERY 8 HOURS AS NEEDED. START WITH 1 TABLET EACH NIGHT AT BEDTIME AS NEEDED DUE TO SEDATION 30 tablet 1  . HYDROcodone-acetaminophen (NORCO) 5-325 MG tablet Take 1 tablet by mouth every 6 (six) hours as needed for moderate pain. 20 tablet 0  . indomethacin (INDOCIN) 50 MG capsule Take 1 capsule (50 mg total) by mouth 2 (two) times daily with a meal. 60 capsule 1  . Na Sulfate-K Sulfate-Mg Sulf 17.5-3.13-1.6 GM/177ML SOLN Take 1 kit by mouth once for 1 dose. 354 mL 0   No facility-administered medications prior to visit.     No Known Allergies  Review of Systems  Constitutional: Negative.   HENT: Positive for ear discharge and ear pain. Negative for congestion, hearing loss, nosebleeds, sinus pain, sore throat and tinnitus.   Eyes: Negative.   Respiratory: Negative.  Negative for stridor.   Cardiovascular: Negative.   Gastrointestinal: Negative.   Genitourinary: Negative.   Musculoskeletal: Negative.   Skin: Negative.   Neurological: Negative.   Endo/Heme/Allergies: Negative.    Psychiatric/Behavioral: Negative.   All other systems reviewed and are negative.      Objective:    Physical Exam  Constitutional: He is oriented to person, place, and time. He appears well-developed and well-nourished. No distress.  HENT:  Right Ear: Hearing, tympanic membrane, external ear and ear canal normal. No drainage, swelling or tenderness. Tympanic membrane is not perforated, not erythematous and not bulging.  Left Ear: Hearing normal. There is drainage, swelling and tenderness. Tympanic membrane is not perforated, not erythematous and not bulging.  Cardiovascular: Normal rate and regular rhythm.  Pulmonary/Chest: Effort normal. No respiratory distress.  Neurological: He is alert and oriented to person, place, and time.  Skin: He is not diaphoretic.  Psychiatric: He has a normal mood and affect. His behavior is normal. Judgment and thought content normal.  Nursing note and vitals reviewed.   BP 117/79   Pulse 71   Temp 98.5 F (36.9 C) (Oral)   Resp 16   Ht '5\' 11"'  (1.803 m)   Wt 247 lb (112 kg)   SpO2 97%   BMI 34.45 kg/m  Wt Readings from Last 3 Encounters:  11/24/18 247 lb (112 kg)  11/24/18 241 lb 3.2 oz (109.4 kg)  04/10/18 238 lb (108 kg)    There are no preventive care reminders to display for this patient.  There are no preventive care reminders to display for this patient.   Lab Results  Component Value Date   TSH 1.238 12/08/2012   Lab Results  Component Value Date   WBC 6.5 12/26/2014   HGB 16.0 12/26/2014   HCT 46.1 12/26/2014   MCV 85.7 12/26/2014   PLT 185 12/26/2014   Lab Results  Component Value Date   NA 142 01/06/2018   K 4.2 01/06/2018   CO2 25 01/06/2018   GLUCOSE 86 01/06/2018   BUN 13 01/06/2018   CREATININE 0.93 01/06/2018   BILITOT 0.7 01/06/2018   ALKPHOS 65 01/06/2018   AST 18 01/06/2018   ALT 18 01/06/2018   PROT 7.2 01/06/2018   ALBUMIN 4.7 01/06/2018   CALCIUM 9.6 01/06/2018   Lab Results  Component Value  Date   CHOL 196 01/06/2018   Lab Results  Component Value Date   HDL 41 01/06/2018   Lab Results  Component Value Date  [place] 103 (H) 01/06/2018   Lab Results  Component Value Date   TRIG 259 (H) 01/06/2018   Lab Results  Component Value Date   CHOLHDL 4.8 01/06/2018   No results found for: HGBA1C     Assessment & Plan:   Problem List Items Addressed This Visit    None    Visit Diagnoses    Infective otitis externa of left ear    -  Primary   Relevant Medications   ciprofloxacin-fluocinolone PF (OTOVEL) 0.3-0.025 % SOLN       Meds ordered this encounter  Medications  . ciprofloxacin-fluocinolone PF (OTOVEL) 0.3-0.025 % SOLN    Sig: Place 0.25 mLs into the left ear 2 (two) times daily.    Dispense:  14 each    Refill:  0    Order Specific Question:   Supervising Provider    Answer:   Delia Chimes A [id number]    PLAN  Appears as moderate otitis externa. Difficult to assess TM, but does not appear perforated.   Cipro HC twice daily for 7 days.   Return to clinic if symptoms worsen or fail to improve.  If concerned for concurrent AOM, return to clinic at preference.  Patient encouraged to call clinic with any questions, comments, or concerns.   Maximiano Coss, NP

## 2018-11-24 NOTE — Patient Instructions (Signed)
° ° ° °  If you have lab work done today you will be contacted with your lab results within the next 2 weeks.  If you have not heard from us then please contact us. The fastest way to get your results is to register for My Chart. ° ° °IF you received an x-ray today, you will receive an invoice from [facility]. Please contact [facility] at [phone] with questions or concerns regarding your invoice.  ° °IF you received labwork today, you will receive an invoice from [facility]. Please contact [facility] at [phone] with questions or concerns regarding your invoice.  ° °Our billing staff will not be able to assist you with questions regarding bills from these companies. ° °You will be contacted with the lab results as soon as they are available. The fastest way to get your results is to activate your My Chart account. Instructions are located on the last page of this paperwork. If you have not heard from us regarding the results in 2 weeks, please contact this office. °  ° ° ° °

## 2018-12-03 ENCOUNTER — Ambulatory Visit (INDEPENDENT_AMBULATORY_CARE_PROVIDER_SITE_OTHER): Payer: No Typology Code available for payment source

## 2018-12-03 ENCOUNTER — Other Ambulatory Visit: Payer: Self-pay | Admitting: Gastroenterology

## 2018-12-03 DIAGNOSIS — Z1159 Encounter for screening for other viral diseases: Secondary | ICD-10-CM

## 2018-12-04 LAB — SARS CORONAVIRUS 2 (TAT 6-24 HRS): SARS Coronavirus 2: NEGATIVE

## 2018-12-08 ENCOUNTER — Encounter: Payer: Self-pay | Admitting: Gastroenterology

## 2018-12-08 ENCOUNTER — Other Ambulatory Visit: Payer: Self-pay

## 2018-12-08 ENCOUNTER — Ambulatory Visit (AMBULATORY_SURGERY_CENTER): Payer: No Typology Code available for payment source | Admitting: Gastroenterology

## 2018-12-08 VITALS — BP 108/77 | HR 60 | Temp 98.1°F | Resp 16 | Ht 73.0 in | Wt 241.2 lb

## 2018-12-08 DIAGNOSIS — K635 Polyp of colon: Secondary | ICD-10-CM

## 2018-12-08 DIAGNOSIS — D124 Benign neoplasm of descending colon: Secondary | ICD-10-CM

## 2018-12-08 DIAGNOSIS — Z1211 Encounter for screening for malignant neoplasm of colon: Secondary | ICD-10-CM

## 2018-12-08 DIAGNOSIS — D125 Benign neoplasm of sigmoid colon: Secondary | ICD-10-CM

## 2018-12-08 MED ORDER — SODIUM CHLORIDE 0.9 % IV SOLN: 500.0000 mL | Freq: Once | INTRAVENOUS | Status: DC

## 2018-12-08 NOTE — Progress Notes (Signed)
Pt. Reports no change in his medical or surgical history since his pre-visit 11/24/2018.

## 2018-12-08 NOTE — Progress Notes (Signed)
PT taken to PACU. Monitors in place. VSS. Report given to RN. 

## 2018-12-08 NOTE — Patient Instructions (Signed)
Please, read the handouts given to you by your recovery room.  Thank-you for choosing [place] for your healthcare needs today.  YOU HAD AN ENDOSCOPIC PROCEDURE TODAY AT [facility]:   Refer to the procedure report that was given to you for any specific questions about what was found during the examination.  If the procedure report does not answer your questions, please call your gastroenterologist to clarify.  If you requested that your care partner not be given the details of your procedure findings, then the procedure report has been included in a sealed envelope for you to review at your convenience later.  YOU SHOULD EXPECT: Some feelings of bloating in the abdomen. Passage of more gas than usual.  Walking can help get rid of the air that was put into your GI tract during the procedure and reduce the bloating. If you had a lower endoscopy (such as a colonoscopy or flexible sigmoidoscopy) you may notice spotting of blood in your stool or on the toilet paper. If you underwent a bowel prep for your procedure, you may not have a normal bowel movement for a few days.  Please Note:  You might notice some irritation and congestion in your nose or some drainage.  This is from the oxygen used during your procedure.  There is no need for concern and it should clear up in a day or so.  SYMPTOMS TO REPORT IMMEDIATELY:   Following lower endoscopy (colonoscopy or flexible sigmoidoscopy):  Excessive amounts of blood in the stool  Significant tenderness or worsening of abdominal pains  Swelling of the abdomen that is new, acute  Fever of 100F or higher   For urgent or emergent issues, a gastroenterologist can be reached at any hour by calling [phone].   DIET:  We do recommend a small meal at first, but then you may proceed to your regular diet.  Drink plenty of fluids but you should avoid alcoholic beverages for 24 hours.  ACTIVITY:  You should plan to take it easy for the rest of  today and you should NOT DRIVE or use heavy machinery until tomorrow (because of the sedation medicines used during the test).    FOLLOW UP: Our staff will call the number listed on your records 48-72 hours following your procedure to check on you and address any questions or concerns that you may have regarding the information given to you following your procedure. If we do not reach you, we will leave a message.  We will attempt to reach you two times.  During this call, we will ask if you have developed any symptoms of COVID 19. If you develop any symptoms (ie: fever, flu-like symptoms, shortness of breath, cough etc.) before then, please call [phone].  If you test positive for Covid 19 in the 2 weeks post procedure, please call and report this information to [place].    If any biopsies were taken you will be contacted by phone or by letter within the next 1-3 weeks.  Please call us at [phone] if you have not heard about the biopsies in 3 weeks.    SIGNATURES/CONFIDENTIALITY: You and/or your care partner have signed paperwork which will be entered into your electronic medical record.  These signatures attest to the fact that that the information above on your After Visit Summary has been reviewed and is understood.  Full responsibility of the confidentiality of this discharge information lies with you and/or your care-partner.

## 2018-12-08 NOTE — Op Note (Signed)
[place] Patient Name: Adam Cunningham Procedure Date: 12/08/2018 9:48 AM MRN: [id number] Endoscopist: Milus Banister , MD Age: 58 Referring MD:  Date of Birth: [birth year] Gender: Male Account #: [id number] Procedure:                Colonoscopy Indications:              Screening for colorectal malignant neoplasm Medicines:                Monitored Anesthesia Care Procedure:                Pre-Anesthesia Assessment:                           - Prior to the procedure, a History and Physical                            was performed, and patient medications and                            allergies were reviewed. The patient's tolerance of                            previous anesthesia was also reviewed. The risks                            and benefits of the procedure and the sedation                            options and risks were discussed with the patient.                            All questions were answered, and informed consent                            was obtained. Prior Anticoagulants: The patient has                            taken no previous anticoagulant or antiplatelet                            agents. ASA Grade Assessment: II - A patient with                            mild systemic disease. After reviewing the risks                            and benefits, the patient was deemed in                            satisfactory condition to undergo the procedure.                           After obtaining informed consent, the colonoscope  was passed under direct vision. Throughout the                            procedure, the patient's blood pressure, pulse, and                            oxygen saturations were monitored continuously. The                            Colonoscope was introduced through the anus and                            advanced to the the cecum, identified by                            appendiceal orifice and  ileocecal valve. The                            colonoscopy was performed without difficulty. The                            patient tolerated the procedure well. The quality                            of the bowel preparation was good. The ileocecal                            valve, appendiceal orifice, and rectum were                            photographed. Scope In: 9:57:41 AM Scope Out: 10:08:57 AM Scope Withdrawal Time: 0 hours 9 minutes 4 seconds  Total Procedure Duration: 0 hours 11 minutes 16 seconds  Findings:                 Two sessile polyps were found in the sigmoid colon                            and descending colon. The polyps were 2 to 4 mm in                            size. These polyps were removed with a cold snare.                            Resection and retrieval were complete.                           External and internal hemorrhoids were found. The                            hemorrhoids were small.                           The exam was otherwise without abnormality on  direct and retroflexion views. Complications:            No immediate complications. Estimated blood loss:                            None. Estimated Blood Loss:     Estimated blood loss: none. Impression:               - Two 2 to 4 mm polyps in the sigmoid colon and in                            the descending colon, removed with a cold snare.                            Resected and retrieved.                           - External and internal hemorrhoids.                           - The examination was otherwise normal on direct                            and retroflexion views. Recommendation:           - Patient has a contact number available for                            emergencies. The signs and symptoms of potential                            delayed complications were discussed with the                            patient. Return to normal activities  tomorrow.                            Written discharge instructions were provided to the                            patient.                           - Resume previous diet.                           - Continue present medications.                           - Await pathology results. Milus Banister, MD 12/08/2018 10:13:12 AM This report has been signed electronically.

## 2018-12-08 NOTE — Progress Notes (Signed)
Called to room to assist during endoscopic procedure.  Patient ID and intended procedure confirmed with present staff. Received instructions for my participation in the procedure from the performing physician.  

## 2018-12-10 ENCOUNTER — Telehealth: Payer: Self-pay

## 2018-12-10 NOTE — Telephone Encounter (Signed)
  Follow up Call-  Call back number 12/08/2018  Post procedure Call Back phone  # [phone]  Permission to leave phone message Yes  Some recent data might be hidden     Patient questions:  Do you have a fever, pain , or abdominal swelling? No. Pain Score  0 *  Have you tolerated food without any problems? Yes.    Have you been able to return to your normal activities? Yes.    Do you have any questions about your discharge instructions: Diet   No. Medications  No. Follow up visit  No.  Do you have questions or concerns about your Care? No.  Actions: * If pain score is 4 or above: No action needed, pain <4.  1. Have you developed a fever since your procedure? No  2.   Have you had an respiratory symptoms (SOB or cough) since your procedure? No 3.   Have you tested positive for COVID 19 since your procedure No  4.   Have you had any family members/close contacts diagnosed with the COVID 19 since your procedure?  No  If yes to any of these questions please route to Joylene John, RN and Alphonsa Gin, RN.

## 2018-12-13 ENCOUNTER — Encounter: Payer: Self-pay | Admitting: Gastroenterology

## 2018-12-29 ENCOUNTER — Other Ambulatory Visit: Payer: Self-pay | Admitting: Family Medicine

## 2018-12-29 DIAGNOSIS — M109 Gout, unspecified: Secondary | ICD-10-CM

## 2018-12-29 MED FILL — ALLOPURINOL 300 MG TABS: 300 | 90 days supply | Qty: 90 | Fill #0

## 2018-12-30 ENCOUNTER — Other Ambulatory Visit: Payer: Self-pay

## 2018-12-30 ENCOUNTER — Ambulatory Visit: Payer: No Typology Code available for payment source | Admitting: Orthopedic Surgery

## 2018-12-30 ENCOUNTER — Ambulatory Visit (INDEPENDENT_AMBULATORY_CARE_PROVIDER_SITE_OTHER): Payer: No Typology Code available for payment source | Admitting: Orthopedic Surgery

## 2018-12-30 VITALS — BP 136/93 | HR 78 | Temp 98.1°F | Ht 73.0 in | Wt 248.0 lb

## 2018-12-30 DIAGNOSIS — M7522 Bicipital tendinitis, left shoulder: Secondary | ICD-10-CM

## 2018-12-30 DIAGNOSIS — M75112 Incomplete rotator cuff tear or rupture of left shoulder, not specified as traumatic: Secondary | ICD-10-CM

## 2018-12-30 DIAGNOSIS — M75111 Incomplete rotator cuff tear or rupture of right shoulder, not specified as traumatic: Secondary | ICD-10-CM

## 2018-12-30 NOTE — Progress Notes (Signed)
Chief Complaint  Patient presents with  . Follow-up    Left shoulder injection    58 year old male had an injection left subacromial space and biceps tendon for subacromial impingement and biceps tendinitis  He is injection was done March 2020 and did well up until a month or so ago he still complaining of painful left shoulder and pain with forward elevation and is here for a repeat injection  He does have tenderness over the biceps tendon it does not seem as bad as it was before  His impingement sign is still positive  Cuff strength seems to be normal  Procedure note the subacromial injection shoulder left   Verbal consent was obtained to inject the  Left   Shoulder  Timeout was completed to confirm the injection site is a subacromial space of the  left  shoulder  Medication used Depo-Medrol 40 mg and lidocaine 1% 3 cc  Anesthesia was provided by ethyl chloride  The injection was performed in the left  posterior subacromial space. After pinning the skin with alcohol and anesthetized the skin with ethyl chloride the subacromial space was injected using a 20-gauge needle. There were no complications  Sterile dressing was applied.  Procedure note biceps tendon injection Left biceps tendon was injected The patient gave verbal consent for cortisone injection Timeout confirmed the site of injection Medications used included 40 mg of Depo-Medrol and 3 mL 1% lidocaine After alcohol and ethyl chloride preparation the point of maximal tenderness was injected over the left  biceps tendon there were no complications  Encounter Diagnoses  Name Primary?  . Tendonitis of upper biceps tendon of left shoulder Yes  . Nontraumatic incomplete tear of right rotator cuff

## 2019-01-08 ENCOUNTER — Encounter: Payer: Self-pay | Admitting: Family Medicine

## 2019-01-08 ENCOUNTER — Other Ambulatory Visit: Payer: Self-pay

## 2019-01-08 ENCOUNTER — Ambulatory Visit (INDEPENDENT_AMBULATORY_CARE_PROVIDER_SITE_OTHER): Payer: No Typology Code available for payment source | Admitting: Family Medicine

## 2019-01-08 VITALS — BP 125/80 | HR 70 | Temp 98.2°F | Ht 73.0 in | Wt 243.8 lb

## 2019-01-08 DIAGNOSIS — Z0001 Encounter for general adult medical examination with abnormal findings: Secondary | ICD-10-CM

## 2019-01-08 DIAGNOSIS — E785 Hyperlipidemia, unspecified: Secondary | ICD-10-CM | POA: Diagnosis not present

## 2019-01-08 DIAGNOSIS — M545 Low back pain, unspecified: Secondary | ICD-10-CM

## 2019-01-08 DIAGNOSIS — Z8546 Personal history of malignant neoplasm of prostate: Secondary | ICD-10-CM

## 2019-01-08 DIAGNOSIS — Z0000 Encounter for general adult medical examination without abnormal findings: Secondary | ICD-10-CM

## 2019-01-08 DIAGNOSIS — Z08 Encounter for follow-up examination after completed treatment for malignant neoplasm: Secondary | ICD-10-CM

## 2019-01-08 DIAGNOSIS — M109 Gout, unspecified: Secondary | ICD-10-CM

## 2019-01-08 DIAGNOSIS — Z23 Encounter for immunization: Secondary | ICD-10-CM

## 2019-01-08 MED ORDER — CYCLOBENZAPRINE HCL 5 MG PO TABS: ORAL_TABLET | ORAL | 1 refills | Status: DC

## 2019-01-08 MED ORDER — HYDROCODONE-ACETAMINOPHEN 5-325 MG PO TABS: 1.0000 | ORAL_TABLET | Freq: Four times a day (QID) | ORAL | 0 refills | Status: DC | PRN

## 2019-01-08 MED ORDER — SHINGRIX 50 MCG/0.5ML IM SUSR: 0.5000 mL | Freq: Once | INTRAMUSCULAR | 1 refills | Status: AC

## 2019-01-08 MED ORDER — COLCHICINE 0.6 MG PO TABS: 0.6000 mg | ORAL_TABLET | Freq: Every day | ORAL | 1 refills | Status: DC

## 2019-01-08 MED ORDER — ALLOPURINOL 300 MG PO TABS: 300.0000 mg | ORAL_TABLET | Freq: Every day | ORAL | 1 refills | Status: DC

## 2019-01-08 MED FILL — CYCLOBENZAPRINE HCL 5 MG TA: 5 | 10 days supply | Qty: 30 | Fill #0

## 2019-01-08 MED FILL — SHINGRIX 50 MCG SUS: 50 | 1 days supply | Qty: 1 | Fill #0

## 2019-01-08 MED FILL — HYDROCODON-APAP 5-325: 5-325 | 5 days supply | Qty: 20 | Fill #0

## 2019-01-08 NOTE — Progress Notes (Signed)
Subjective:  Patient ID: Adam Cunningham, male    DOB: [birth year]  Age: 58 y.o. MRN: [id number]  CC:  Chief Complaint  Patient presents with  . Annual Exam    CPE  . Medication Refill    pain med and muscle relaxer    HPI Adam Cunningham presents for   Annual physical exam and med refills.   Still followed by Dr. Aline Brochure with [facility].   Hyperlipidemia: Not currently on medication.  Borderline 10-year ASCVD risk.  Weight stable at home.  Wt Readings from Last 3 Encounters:  01/08/19 243 lb 12.8 oz (110.6 kg)  12/30/18 248 lb (112.5 kg)  12/08/18 241 lb 3.2 oz (109.4 kg)    The 10-year ASCVD risk score Mikey Bussing DC Jr., et al., 2013) is: 8.7%   Values used to calculate the score:     Age: 89 years     Sex: Male     Is Non-Hispanic African American: No     Diabetic: No     Tobacco smoker: No     Systolic Blood Pressure: 671 mmHg     Is BP treated: No     HDL Cholesterol: 41 mg/dL     Total Cholesterol: 196 mg/dL  Lab Results  Component Value Date   CHOL 196 01/06/2018   HDL 41 01/06/2018   LDLCALC 103 (H) 01/06/2018   TRIG 259 (H) 01/06/2018   CHOLHDL 4.8 01/06/2018   Lab Results  Component Value Date   ALT 18 01/06/2018   AST 18 01/06/2018   ALKPHOS 65 01/06/2018   BILITOT 0.7 01/06/2018   Gout: Last flare: 2 in past year  Daily meds: Allopurinol 300 mg daily Prn med: Colchicine 0.6 mg - takes 2 then 1 a day for a few days.  Has used indomethacin with colcrys in past.  On ibuprofen for shoulder pain - treated by ortho.   Lab Results  Component Value Date   LABURIC 6.7 01/06/2018   Recurrent low back pain History of episodic flares, has used Flexeril or indomethacin as needed during flares, rare hydrocodone if needed.  Controlled substance database (PDMP) reviewed. No listings. Tried colchicine last flare and it helped. Only hydrocodone in past with extreme pain.  Flares of back pain every 3 months - rare.   Cancer  screening: Colonoscopy 12/08/2018.  History of prostate cancer, prostatectomy May 2009, urologist Dr. Karsten Ro. Ongoing PSA monitoring has been stable. PSA <0.1. Derm - none. No new moles.    Immunization History  Administered Date(s) Administered  . Influenza Split 12/09/2011  . Influenza,inj,Quad PF,6+ Mos 12/08/2012, 12/14/2013, 12/27/2015, 01/07/2017, 01/06/2018, 11/05/2018  . Influenza-Unspecified 11/22/2014  . Tdap 10/25/2010  shingles vaccine - agrees, will print Rx.   Depression screen [facility] 2/9 01/08/2019 11/24/2018 03/24/2018 01/06/2018 07/17/2017  Decreased Interest 0 0 0 0 0  Down, Depressed, Hopeless 0 0 0 0 0  PHQ - 2 Score 0 0 0 0 0    Hearing Screening   '125Hz'  '250Hz'  '500Hz'  '1000Hz'  '2000Hz'  '3000Hz'  '4000Hz'  '6000Hz'  '8000Hz'   Right ear:           Left ear:             Visual Acuity Screening   Right eye Left eye Both eyes  Without correction:     With correction: 20/30-1 20/25-1 20/20  appt last week - Dr. Herbert Deaner.   Dental:  appt in January - every 6 months.   Exercise: activity at home, but no specific regimen during  week. Weekends with outside work.    History Patient Active Problem List   Diagnosis Date Noted  . Hyperlipidemia 11/26/2011  . Gout 11/26/2011  . Low testosterone 11/26/2011  . H/O prostate cancer 05/26/2007   Past Medical History:  Diagnosis Date  . Cancer [facility])    prostate   Past Surgical History:  Procedure Laterality Date  . BICEPS TENDON REPAIR    . PROSTATE SURGERY     No Known Allergies Prior to Admission medications   Medication Sig Start Date End Date Taking? Authorizing Provider  allopurinol (ZYLOPRIM) 300 MG tablet TAKE 1 TABLET BY MOUTH ONCE DAILY 12/29/18  Yes [name], MD  colchicine 0.6 MG tablet Take 1 tablet (0.6 mg total) by mouth daily. 09/30/15  Yes [name], MD  indomethacin (INDOCIN) 50 MG capsule Take 1 capsule (50 mg total) by mouth 2 (two) times daily with a meal. 12/27/13  Yes Daub, Loura Back, MD   cyclobenzaprine (FLEXERIL) 5 MG tablet TAKE 1 TABLET BY MOUTH UP TO EVERY 8 HOURS AS NEEDED. START WITH 1 TABLET EACH NIGHT AT BEDTIME AS NEEDED DUE TO SEDATION Patient not taking: Reported on 12/08/2018 01/07/17   [name], MD  HYDROcodone-acetaminophen (NORCO) 5-325 MG tablet Take 1 tablet by mouth every 6 (six) hours as needed for moderate pain. Patient not taking: Reported on 12/08/2018 12/27/15   [name], MD   Social History   Socioeconomic History  . Marital status: Married    Spouse name: Not on file  . Number of children: Not on file  . Years of education: Not on file  . Highest education level: Not on file  Occupational History  . 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 Occupation: Airline pilot  Tobacco Use  . Smoking status: Never Smoker  . Smokeless tobacco: Never Used  Substance and Sexual Activity  . Alcohol use: No  . Drug use: No  . Sexual activity: Yes  Other Topics Concern  . 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   Social research officer, government   Education: [employer]   Exercise: Yes   Social Determinants of Radio broadcast assistant Strain:   . Difficulty of Paying Living Expenses: Not on file  Food Insecurity:   . Worried About Charity fundraiser in the Last Year: Not on file  . Ran Out of Food in the Last Year: Not on file  Transportation Needs:   . Lack of Transportation (Medical): Not on file  . Lack of Transportation (Non-Medical): Not on file  Physical Activity:   . Days of Exercise per Week: Not on file  . Minutes of Exercise per Session: Not on file  Stress:   . Feeling of Stress : Not on file  Social Connections:   . Frequency of Communication with Friends and Family: Not on file  . Frequency of Social Gatherings with Friends and Family: Not on file  . Attends Religious Services: Not on file  . Active Member of Clubs or Organizations: Not on file  . Attends Archivist Meetings: Not on file  . Marital  Status: Not on file  Intimate Partner Violence:   . Fear of Current or Ex-Partner: Not on file  . Emotionally Abused: Not on file  . Physically Abused: Not on file  . Sexually Abused: Not on file    Review of Systems  13 point review of systems per patient health survey noted.  Negative  other than as indicated above or in HPI.   Objective:   Vitals:   01/08/19 0926  BP: 125/80  Pulse: 70  Temp: 98.2 F (36.8 C)  TempSrc: Temporal  SpO2: 95%  Weight: 243 lb 12.8 oz (110.6 kg)  Height: '6\' 1"'  (1.854 m)    Physical Exam Vitals reviewed.  Constitutional:      Appearance: He is well-developed.  HENT:     Head: Normocephalic and atraumatic.     Right Ear: External ear normal.     Left Ear: External ear normal.  Eyes:     Conjunctiva/sclera: Conjunctivae normal.     Pupils: Pupils are equal, round, and reactive to light.  Neck:     Thyroid: No thyromegaly.  Cardiovascular:     Rate and Rhythm: Normal rate and regular rhythm.     Heart sounds: Normal heart sounds.  Pulmonary:     Effort: Pulmonary effort is normal. No respiratory distress.     Breath sounds: Normal breath sounds. No wheezing.  Abdominal:     General: There is no distension.     Palpations: Abdomen is soft.     Tenderness: There is no abdominal tenderness.     Hernia: There is no hernia in the left inguinal area or right inguinal area.  Genitourinary:    Prostate: Normal.  Musculoskeletal:        General: No tenderness. Normal range of motion.     Cervical back: Normal range of motion and neck supple.  Lymphadenopathy:     Cervical: No cervical adenopathy.  Skin:    General: Skin is warm and dry.  Neurological:     Mental Status: He is alert and oriented to person, place, and time.     Deep Tendon Reflexes: Reflexes are normal and symmetric.  Psychiatric:        Behavior: Behavior normal.        Assessment & Plan:  Adam Cunningham is a 58 y.o. male . Annual physical exam  -  -anticipatory guidance as below in AVS, screening labs above. Health maintenance items as above in HPI discussed/recommended as applicable.   Encounter for follow-up surveillance of prostate cancer - Plan: PSA  - check PSA, follow up with urology as needed.   Gout, unspecified cause, unspecified chronicity, unspecified site - Plan: Uric acid, colchicine 0.6 MG tablet, allopurinol (ZYLOPRIM) 300 MG tablet  - improved on allopurinol 368m qd.  Colchicine if needed, hydrocodone rarely if needed.  Hyperlipidemia, unspecified hyperlipidemia type - Plan: CMP14+EGFR, Lipid panel  -Borderline ASCVD risk score, recheck lipids.  Activity/exercise discussed.  Need for shingles vaccine - Plan: Zoster Vaccine Adjuvanted ([facility] injection -printed for pharmacy  Low back pain, episodic - Plan: cyclobenzaprine (FLEXERIL) 5 MG tablet, HYDROcodone-acetaminophen (NORCO) 5-325 MG tablet  -Episodic, Flexeril refilled, hydrocodone refilled for as needed  - RTC precautions given frequent use/need.  Risks of medications were discussed  Gouty arthritis - Plan: HYDROcodone-acetaminophen (NORCO) 5-325 MG tablet, colchicine 0.6 MG tablet, allopurinol (ZYLOPRIM) 300 MG tablet  -As above, continue same regimen  Meds ordered this encounter  Medications  . Zoster Vaccine Adjuvanted ([facility] injection    Sig: Inject 0.5 mLs into the muscle once for 1 dose. Repeat in 2-6 months.    Dispense:  0.5 mL    Refill:  1  . cyclobenzaprine (FLEXERIL) 5 MG tablet    Sig: TAKE 1 TABLET BY MOUTH UP TO EVERY 8 HOURS AS NEEDED. START WITH 1 TABLET EACH NIGHT AT BEDTIME  AS NEEDED DUE TO SEDATION    Dispense:  30 tablet    Refill:  1  . HYDROcodone-acetaminophen (NORCO) 5-325 MG tablet    Sig: Take 1 tablet by mouth every 6 (six) hours as needed for moderate pain.    Dispense:  20 tablet    Refill:  0  . colchicine 0.6 MG tablet    Sig: Take 1 tablet (0.6 mg total) by mouth daily.    Dispense:  60 tablet    Refill:  1  .  allopurinol (ZYLOPRIM) 300 MG tablet    Sig: Take 1 tablet (300 mg total) by mouth daily.    Dispense:  90 tablet    Refill:  1   Patient Instructions    No change in medications at this time.  If back pain flares increase in frequency, follow-up to discuss further.  Keep follow-up with orthopedic specialist for your shoulder pain.  Shingles vaccine printed.  I will let you know if there are any concerns on blood work.  Thank you for coming in today and take care.   Keeping you healthy  Get these tests  Blood pressure- Have your blood pressure checked once a year by your healthcare provider.  Normal blood pressure is 120/80  Weight- Have your body mass index (BMI) calculated to screen for obesity.  BMI is a measure of body fat based on height and weight. You can also calculate your own BMI at [email or web address].  Cholesterol- Have your cholesterol checked every year.  Diabetes- Have your blood sugar checked regularly if you have high blood pressure, high cholesterol, have a family history of diabetes or if you are overweight.  Screening for Colon Cancer- Colonoscopy starting at age 58.  Screening may begin sooner depending on your family history and other health conditions. Follow up colonoscopy as directed by your Gastroenterologist.  Screening for Prostate Cancer- Both blood work (PSA) and a rectal exam help screen for Prostate Cancer.  Screening begins at age 80 with African-American men and at age 35 with Caucasian men.  Screening may begin sooner depending on your family history.  Take these medicines  Aspirin- One aspirin daily can help prevent Heart disease and Stroke.  Flu shot- Every fall.  Tetanus- Every 10 years.  Zostavax- Once after the age of 29 to prevent Shingles.  Pneumonia shot- Once after the age of 10; if you are younger than 73, ask your healthcare provider if you need a Pneumonia shot.  Take these steps  Don't smoke- If you do smoke, talk to your  doctor about quitting.  For tips on how to quit, go to www.smokefree.gov or call 1-800-QUIT-NOW.  Be physically active- Exercise 5 days a week for at least 30 minutes.  If you are not already physically active start slow and gradually work up to 30 minutes of moderate physical activity.  Examples of moderate activity include walking briskly, mowing the yard, dancing, swimming, bicycling, etc.  Eat a healthy diet- Eat a variety of healthy food such as fruits, vegetables, low fat milk, low fat cheese, yogurt, lean meant, poultry, fish, beans, tofu, etc. For more information go to www.thenutritionsource.org  Drink alcohol in moderation- Limit alcohol intake to less than two drinks a day. Never drink and drive.  Dentist- Brush and floss twice daily; visit your dentist twice a year.  Depression- Your emotional health is as important as your physical health. If you're feeling down, or losing interest in things you would normally enjoy please talk to  your healthcare provider.  Eye exam- Visit your eye doctor every year.  Safe sex- If you may be exposed to a sexually transmitted infection, use a condom.  Seat belts- Seat belts can save your life; always wear one.  Smoke/Carbon Monoxide detectors- These detectors need to be installed on the appropriate level of your home.  Replace batteries at least once a year.  Skin cancer- When out in the sun, cover up and use sunscreen 15 SPF or higher.  Violence- If anyone is threatening you, please tell your healthcare provider.  Living Will/ Health care power of attorney- Speak with your healthcare provider and family.     If you have lab work done today you will be contacted with your lab results within the next 2 weeks.  If you have not heard from [place] then please contact us. The fastest way to get your results is to register for My Chart.   IF you received an x-ray today, you will receive an invoice from [facility]. Please contact [facility]  [facility] at [phone] with questions or concerns regarding your invoice.   IF you received labwork today, you will receive an invoice from [place]. Please contact [facility] at [phone] with questions or concerns regarding your invoice.   Our billing staff will not be able to assist you with questions regarding bills from these companies.  You will be contacted with the lab results as soon as they are available. The fastest way to get your results is to activate your My Chart account. Instructions are located on the last page of this paperwork. If you have not heard from [place] regarding the results in 2 weeks, please contact this office.          Signed, Merri Ray, MD [facility]

## 2019-01-08 NOTE — Patient Instructions (Addendum)
No change in medications at this time.  If back pain flares increase in frequency, follow-up to discuss further.  Keep follow-up with orthopedic specialist for your shoulder pain.  Shingles vaccine printed.  I will let you know if there are any concerns on blood work.  Thank you for coming in today and take care.   Keeping you healthy  Get these tests  Blood pressure- Have your blood pressure checked once a year by your healthcare provider.  Normal blood pressure is 120/80  Weight- Have your body mass index (BMI) calculated to screen for obesity.  BMI is a measure of body fat based on height and weight. You can also calculate your own BMI at ViewBanking.[email or web address].  Cholesterol- Have your cholesterol checked every year.  Diabetes- Have your blood sugar checked regularly if you have high blood pressure, high cholesterol, have a family history of diabetes or if you are overweight.  Screening for Colon Cancer- Colonoscopy starting at age 40.  Screening may begin sooner depending on your family history and other health conditions. Follow up colonoscopy as directed by your Gastroenterologist.  Screening for Prostate Cancer- Both blood work (PSA) and a rectal exam help screen for Prostate Cancer.  Screening begins at age 5 with African-American men and at age 40 with Caucasian men.  Screening may begin sooner depending on your family history.  Take these medicines  Aspirin- One aspirin daily can help prevent Heart disease and Stroke.  Flu shot- Every fall.  Tetanus- Every 10 years.  Zostavax- Once after the age of 49 to prevent Shingles.  Pneumonia shot- Once after the age of 59; if you are younger than 70, ask your healthcare provider if you need a Pneumonia shot.  Take these steps  Don't smoke- If you do smoke, talk to your doctor about quitting.  For tips on how to quit, go to www.smokefree.gov or call 1-800-QUIT-NOW.  Be physically active- Exercise 5 days a week for at least 30  minutes.  If you are not already physically active start slow and gradually work up to 30 minutes of moderate physical activity.  Examples of moderate activity include walking briskly, mowing the yard, dancing, swimming, bicycling, etc.  Eat a healthy diet- Eat a variety of healthy food such as fruits, vegetables, low fat milk, low fat cheese, yogurt, lean meant, poultry, fish, beans, tofu, etc. For more information go to www.thenutritionsource.org  Drink alcohol in moderation- Limit alcohol intake to less than two drinks a day. Never drink and drive.  Dentist- Brush and floss twice daily; visit your dentist twice a year.  Depression- Your emotional health is as important as your physical health. If you're feeling down, or losing interest in things you would normally enjoy please talk to your healthcare provider.  Eye exam- Visit your eye doctor every year.  Safe sex- If you may be exposed to a sexually transmitted infection, use a condom.  Seat belts- Seat belts can save your life; always wear one.  Smoke/Carbon Monoxide detectors- These detectors need to be installed on the appropriate level of your home.  Replace batteries at least once a year.  Skin cancer- When out in the sun, cover up and use sunscreen 15 SPF or higher.  Violence- If anyone is threatening you, please tell your healthcare provider.  Living Will/ Health care power of attorney- Speak with your healthcare provider and family.     If you have lab work done today you will be contacted with your lab results within  the next 2 weeks.  If you have not heard from [place] then please contact us. The fastest way to get your results is to register for My Chart.   IF you received an x-ray today, you will receive an invoice from [facility]. Please contact [facility] at [phone] with questions or concerns regarding your invoice.   IF you received labwork today, you will receive an invoice from [place]. Please  contact [facility] at [phone] with questions or concerns regarding your invoice.   Our billing staff will not be able to assist you with questions regarding bills from these companies.  You will be contacted with the lab results as soon as they are available. The fastest way to get your results is to activate your My Chart account. Instructions are located on the last page of this paperwork. If you have not heard from [place] regarding the results in 2 weeks, please contact this office.

## 2019-01-09 LAB — PSA: Prostate Specific Ag, Serum: <0.1 ng/mL (ref 0.0–4.0)

## 2019-01-09 LAB — CMP14+EGFR
ALT: 16 IU/L (ref 0–44)
AST: 19 IU/L (ref 0–40)
Albumin/Globulin Ratio: 1.9 (ref 1.2–2.2)
Albumin: 4.7 g/dL (ref 3.8–4.9)
Alkaline Phosphatase: 77 IU/L (ref 39–117)
BUN/Creatinine Ratio: 23 — ABNORMAL HIGH (ref 9–20)
BUN: 21 mg/dL (ref 6–24)
Bilirubin Total: 0.3 mg/dL (ref 0.0–1.2)
CO2: 23 mmol/L (ref 20–29)
Calcium: 9.6 mg/dL (ref 8.7–10.2)
Chloride: 101 mmol/L (ref 96–106)
Creatinine, Ser: 0.9 mg/dL (ref 0.76–1.27)
GFR calc Af Amer: 108 mL/min/{1.73_m2} (ref >59)
GFR calc non Af Amer: 93 mL/min/{1.73_m2} (ref >59)
Globulin, Total: 2.5 g/dL (ref 1.5–4.5)
Glucose: 85 mg/dL (ref 65–99)
Potassium: 4.1 mmol/L (ref 3.5–5.2)
Sodium: 139 mmol/L (ref 134–144)
Total Protein: 7.2 g/dL (ref 6.0–8.5)

## 2019-01-09 LAB — URIC ACID: Uric Acid: 5.7 mg/dL (ref 3.8–8.4)

## 2019-01-09 LAB — LIPID PANEL
Chol/HDL Ratio: 4.4 ratio (ref 0.0–5.0)
Cholesterol, Total: 217 mg/dL — ABNORMAL HIGH (ref 100–199)
HDL: 49 mg/dL (ref >39)
LDL Chol Calc (NIH): 137 mg/dL — ABNORMAL HIGH (ref 0–99)
Triglycerides: 176 mg/dL — ABNORMAL HIGH (ref 0–149)
VLDL Cholesterol Cal: 31 mg/dL (ref 5–40)

## 2019-03-31 ENCOUNTER — Other Ambulatory Visit: Payer: Self-pay

## 2019-03-31 ENCOUNTER — Encounter: Payer: Self-pay | Admitting: Orthopedic Surgery

## 2019-03-31 ENCOUNTER — Ambulatory Visit (INDEPENDENT_AMBULATORY_CARE_PROVIDER_SITE_OTHER): Payer: No Typology Code available for payment source | Admitting: Orthopedic Surgery

## 2019-03-31 VITALS — BP 128/85 | HR 70 | Temp 97.7°F | Ht 74.0 in

## 2019-03-31 DIAGNOSIS — M75111 Incomplete rotator cuff tear or rupture of right shoulder, not specified as traumatic: Secondary | ICD-10-CM

## 2019-03-31 NOTE — Progress Notes (Signed)
Chief Complaint  Patient presents with  . Follow-up    Recheck on left shoulder   Adam Cunningham is a 59 year old male presented with a 64-month history of left shoulder pain back in March 2020 he was treated with Ibuprofen subacromial injection and Codman exercises but did not improve  His initial insult was closing a car door.  He presented to [place] with those prior treatments, his injection was repeated in the subacromial space as well as the biceps tendon he started rotator cuff strengthening program continued ibuprofen  He returned on December 9 reinjected exercise program reinforced but he has not improved the shoulder is gotten worse he now has loss of motion in abduction and flexion he has a severe amount of crepitance and weakness along with pain primarily in the anterolateral portion of the shoulder  Past Medical History:  Diagnosis Date  . Cancer (HCC)    prostate   No Known Allergies    BP 128/85   Pulse 70   Temp 97.7 F (36.5 C)   Ht 6\' 2"  (1.88 m)   BMI 31.30 kg/m   Right shoulder full range of motion no tenderness right shoulder stable normal muscle strength skin intact  Left shoulder is tender in the front of the shoulder he does have equal internal rotation versus the right but his abduction is only 80 degrees his flexion is 120 degrees actively with passive flexion 180 with pain between 120 degrees in 180 degrees he has weakness in abduction 3 out of 5 in supraspinatus 3 out of 5.  Crepitance on palpation with no instability  Encounter Diagnosis  Name Primary?  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

## 2019-03-31 NOTE — Patient Instructions (Signed)
You have been scheduled for an MRI scan  We will call your insurance company to do a precertification to get the MRI covered  You will receive a phone call regarding the date of the scan  Dr Aline Brochure will call you with the results

## 2019-04-12 MED FILL — SHINGRIX 50 MCG SUS: 50 | 1 days supply | Qty: 1 | Fill #1

## 2019-04-14 ENCOUNTER — Other Ambulatory Visit: Payer: Self-pay

## 2019-04-14 ENCOUNTER — Ambulatory Visit (HOSPITAL_COMMUNITY)
Admission: RE | Admit: 2019-04-14 | Discharge: 2019-04-14 | Disposition: A | Discharge status: Home / Self Care | Payer: No Typology Code available for payment source | Source: Ambulatory Visit | Attending: Orthopedic Surgery | Admitting: Orthopedic Surgery

## 2019-04-14 DIAGNOSIS — M75111 Incomplete rotator cuff tear or rupture of right shoulder, not specified as traumatic: Secondary | ICD-10-CM | POA: Diagnosis present

## 2019-04-15 ENCOUNTER — Other Ambulatory Visit: Payer: Self-pay | Admitting: Orthopedic Surgery

## 2019-04-15 ENCOUNTER — Telehealth: Payer: Self-pay | Admitting: Orthopedic Surgery

## 2019-04-15 NOTE — Telephone Encounter (Signed)
Phone call to deliver results that the patient has rotator cuff tear left shoulder.  I recommended surgery he agrees.  Wants to schedule soon as possible.  CLINICAL DATA:  Left shoulder pain for the past 2 months.   EXAM: MRI OF THE LEFT SHOULDER WITHOUT CONTRAST   TECHNIQUE: Multiplanar, multisequence MR imaging of the shoulder was performed. No intravenous contrast was administered.   COMPARISON:  Left shoulder x-rays dated March 06, 2017.   FINDINGS: Rotator cuff: Full-thickness, near full width tear of the supraspinatus tendon with 2.5 cm retraction to the top of the humeral head. Mild infraspinatus and subscapularis tendinosis at their insertions. The teres minor tendon is unremarkable.   Muscles:  No muscle edema. Complete atrophy the teres minor muscle.   Biceps long head: Intact with severe intra-articular tendinosis and slight medial subluxation as it enters the bicipital groove.   Acromioclavicular Joint: Mild arthropathy of the acromioclavicular joint. Type II acromion. Small amount of fluid in synovitis in the subacromial/subdeltoid bursa.   Glenohumeral Joint: Small joint effusion. Mild diffuse cartilage thinning without focal defect.   Labrum: Posterosuperior and posteroinferior labral tear extending from 10-8 o'clock, with tiny paralabral cyst (series 5, images 11-16). Remaining labrum is intact.   Bones:  No marrow abnormality, fracture or dislocation.   Other: None.   IMPRESSION: 1. Full-thickness, near full width tear of the supraspinatus tendon with 2.5 cm retraction. No atrophy. 2. Mild infraspinatus and subscapularis tendinosis. 3. Severe intra-articular biceps tendinosis and slight medial subluxation as it enters the bicipital groove. 4. Posterosuperior and posteroinferior labral tear with tiny paralabral cyst. 5. Complete atrophy of the teres minor muscle. 6. Mild acromioclavicular and glenohumeral osteoarthritis.     Electronically Signed  By: Titus Dubin M.D.   On: 04/15/2019 09:09

## 2019-04-21 ENCOUNTER — Other Ambulatory Visit: Payer: Self-pay | Admitting: Orthopedic Surgery

## 2019-04-21 DIAGNOSIS — M75111 Incomplete rotator cuff tear or rupture of right shoulder, not specified as traumatic: Secondary | ICD-10-CM

## 2019-04-26 NOTE — Patient Instructions (Signed)
Adam Cunningham.  04/26/2019     @PREFPERIOPPHARMACY @   Your procedure is scheduled on  04/29/2019 .  Report to [facility] at  1000  A.M.  Call this number if you have problems the morning of surgery:  [phone]   Remember:  Do not eat or drink after midnight.                       Take these medicines the morning of surgery with A SIP OF WATER  Allopurinol, flexeril, hydrocodone or indocin (if needed).    Do not wear jewelry, make-up or nail polish.  Do not wear lotions, powders, or perfumes, or deodorant. Please brush your teeth.  Do not shave 48 hours prior to surgery.  Men may shave face and neck.  Do not bring valuables to the hospital.  [facility] is not responsible for any belongings or valuables.  Contacts, dentures or bridgework may not be worn into surgery.  Leave your suitcase in the car.  After surgery it may be brought to your room.  For patients admitted to the hospital, discharge time will be determined by your treatment team.  Patients discharged the day of surgery will not be allowed to drive home.   Name and phone number of your driver:   family Special instructions:  DO NOT smoke the morning of your surgery.  Please read over the following fact sheets that you were given. Anesthesia Post-op Instructions and Care and Recovery After Surgery       Rotator Cuff Tear Rehab After Surgery Ask your health care provider which exercises are safe for you. Do exercises exactly as told by your health care provider and adjust them as directed. It is normal to feel mild stretching, pulling, tightness, or discomfort as you do these exercises. Stop right away if you feel sudden pain or your pain gets worse. Do not begin these exercises until told by your health care provider. Stretching and range-of-motion exercises These exercises warm up your muscles and joints and improve the movement and flexibility of your shoulder. These exercises also help to  relieve pain. Shoulder pendulum In this exercise, you let the injured arm dangle toward the floor and then swing it like a clock pendulum. 1. Stand near a table or counter that you can hold onto for balance. 2. Bend forward at the waist and let your left / right arm hang straight down. Use your other arm to support you and help you stay balanced. 3. Relax your left / right arm and shoulder muscles, and move your hips and your trunk so your left / right arm swings freely. Your arm should swing because of the motion of your body, not because you are using your arm or shoulder muscles. 4. Keep moving your hips and trunk so your arm swings in the following directions, as told by your health care provider: ? Side to side. ? Forward and backward. ? In clockwise and counterclockwise circles. 5. Slowly return to the starting position. Repeat __________ times, or for __________ seconds per direction. Complete this exercise __________ times a day. Shoulder flexion, seated In this exercise, you raise your arm in front of your body until you feel a stretch in your injured shoulder. 1. Sit in a stable chair so your left / right forearm can rest on a flat surface. Your elbow should rest at a height that keeps your upper arm next  to your body. 2. Keeping your left / right shoulder relaxed, lean forward at the waist and let your hand slide forward (flexion). Stop when you feel a stretch in your shoulder, or when you reach the angle that is recommended by your health care provider. 3. Hold for __________ seconds. 4. Slowly return to the starting position. Repeat __________ times. Complete this exercise __________ times a day. Shoulder flexion, standing In this exercise, you raise your arm in front of your body (flexion) until you feel a stretch in your injured shoulder. 1. Stand and hold a broomstick, a cane, or a similar object. Place your hands a little more than shoulder width apart on the object. Your left /  right hand should be palm-up, and your other hand should be palm-down. 2. Keep your elbow straight and your shoulder muscles relaxed. Push the stick up with your healthy arm to raise your left / right arm in front of your body, and then over your head until you feel a stretch in your shoulder. ? Avoid shrugging your shoulder while you raise your arm. Keep your shoulder blade tucked down toward the middle of your back. ? Keep your left / right shoulder muscles relaxed. 3. Hold for __________ seconds. 4. Slowly return to the starting position. Repeat __________ times. Complete this exercise __________ times a day. Shoulder abduction, active-assisted You will need a stick, broom handle, or similar object to help you (assist) in doing this exercise. 1. Lie on your back. This is the supine position. Hold a broomstick, a cane, or a similar object. 2. Place your hands a little more than shoulder width apart on the object. Your left / right hand should be palm-up, and your other hand should be palm-down. 3. Keeping your shoulder relaxed, push the stick to raise your left / right arm out to your side (abduction) and then over your head. Use your other hand to help move the stick. Stop when you feel a stretch in your shoulder, or when you reach the angle that is recommended by your health care provider. ? Avoid shrugging your shoulder while you raise your arm. Keep your shoulder blade tucked down toward the middle of your back. 4. Hold for __________ seconds. 5. Slowly return to the starting position. Repeat __________ times. Complete this exercise __________ times a day. Shoulder flexion, active-assisted  1. Lie on your back. You may bend your knees for comfort. 2. Hold a broomstick, a cane, or a similar object so that your hands are about shoulder width apart. Your palms should face toward your feet. 3. Raise your left / right arm over your head, then behind your head toward the floor (flexion). Use your  other hand to help you do this (active-assisted). Stop when you feel a gentle stretch in your shoulder, or when you reach the angle that is recommended by your health care provider. 4. Hold for __________ seconds. 5. Use the stick and your other arm to help you return your left / right arm to the starting position. Repeat __________ times. Complete this exercise __________ times a day. External rotation  1. Sit in a stable chair without armrests, or stand up. 2. Tuck a soft object, such as a folded towel or a small ball, under your left / right upper arm. 3. Hold a broomstick, a cane, or a similar object with your palms face-down, toward the floor. Bend your elbows to a 90-degree angle (right angle), and keep your hands about shoulder width apart.  4. Straighten your healthy arm and push the stick across your body, toward your left / right side. Keep your left / right arm bent. This will rotate your left / right forearm away from your body (external rotation). 5. Hold for __________ seconds. 6. Slowly return to the starting position. Repeat __________ times. Complete this exercise __________ times a day. Strengthening exercises These exercises build strength and endurance in your shoulder. Endurance is the ability to use your muscles for a long time, even after they get tired. Shoulder flexion, isometric  1. Stand or sit in a doorway, facing the door frame. 2. Keep your left / right arm straight and make a gentle fist with your hand. Place your fist against the door frame. Only your fist should be touching the frame. Keep your upper arm at your side. 3. Gently press your fist against the door frame, as if you are trying to raise your arm above your head (isometric shoulder flexion). ? Avoid shrugging your shoulder while you press your hand into the door frame. Keep your shoulder blade tucked down toward the middle of your back. 4. Hold for __________ seconds. 5. Slowly release the tension, and  relax your muscles completely before you repeat the exercise. Repeat __________ times. Complete this exercise __________ times a day. Shoulder abduction, isometric 1. Stand or sit in a doorway. Your left / right arm should be closest to the door frame. 2. Keep your left / right arm straight, and place the back of your hand against the door frame. Only your hand should be touching the frame. Keep the rest of your arm close to your side. 3. Gently press the back of your hand against the door frame, as if you are trying to raise your arm out to the side (isometric shoulder abduction). ? Avoid shrugging your shoulder while you press your hand into the door frame. Keep your shoulder blade tucked down toward the middle of your back. 4. Hold for __________ seconds. 5. Slowly release the tension, and relax your muscles completely before you repeat the exercise. Repeat __________ times. Complete this exercise __________ times a day. Internal rotation, isometric This is an exercise in which you press your palm against a door frame without moving your shoulder joint (isometric). 1. Stand or sit in a doorway, facing the door frame. 2. Bend your left / right elbow, and place the palm of your hand against the door frame. Only your palm should be touching the frame. Keep your upper arm at your side. 3. Gently press your hand against the door frame, as if you are trying to push your arm toward your abdomen (internal rotation). Gradually increase the pressure until you are pressing as hard as you can. Stop increasing the pressure if you feel shoulder pain. ? Avoid shrugging your shoulder while you press your hand into the door frame. Keep your shoulder blade tucked down toward the middle of your back. 4. Hold for __________ seconds. 5. Slowly release the tension, and relax your muscles completely before you repeat the exercise. Repeat __________ times. Complete this exercise __________ times a day. External  rotation, isometric This is an exercise in which you press the back of your wrist against a door frame without moving your shoulder joint (isometric). 1. Stand or sit in a doorway, facing the door frame. 2. Bend your left / right elbow and place the back of your wrist against the door frame. Only the back of your wrist should be touching the  frame. Keep your upper arm at your side. 3. Gently press your wrist against the door frame, as if you are trying to push your arm away from your abdomen (external rotation). Gradually increase the pressure until you are pressing as hard as you can. Stop increasing the pressure if you feel pain. ? Avoid shrugging your shoulder while you press your wrist into the door frame. Keep your shoulder blade tucked down toward the middle of your back. 4. Hold for __________ seconds. 5. Slowly release the tension, and relax your muscles completely before you repeat the exercise. Repeat __________ times. Complete this exercise __________ times a day. This information is not intended to replace advice given to you by your health care provider. Make sure you discuss any questions you have with your health care provider. Document Revised: 05/01/2018 Document Reviewed: 04/21/2018 Elsevier Patient Education  [place] After These instructions provide you with information about caring for yourself after your procedure. Your health care provider may also give you more specific instructions. Your treatment has been planned according to current medical practices, but problems sometimes occur. Call your health care provider if you have any problems or questions after your procedure. What can I expect after the procedure? After your procedure, you may:  Feel sleepy for several hours.  Feel clumsy and have poor balance for several hours.  Feel forgetful about what happened after the procedure.  Have poor judgment for several  hours.  Feel nauseous or vomit.  Have a sore throat if you had a breathing tube during the procedure. Follow these instructions at home: For at least 24 hours after the procedure:      Have a responsible adult stay with you. It is important to have someone help care for you until you are awake and alert.  Rest as needed.  Do not: ? Participate in activities in which you could fall or become injured. ? Drive. ? Use heavy machinery. ? Drink alcohol. ? Take sleeping pills or medicines that cause drowsiness. ? Make important decisions or sign legal documents. ? Take care of children on your own. Eating and drinking  Follow the diet that is recommended by your health care provider.  If you vomit, drink water, juice, or soup when you can drink without vomiting.  Make sure you have little or no nausea before eating solid foods. General instructions  Take over-the-counter and prescription medicines only as told by your health care provider.  If you have sleep apnea, surgery and certain medicines can increase your risk for breathing problems. Follow instructions from your health care provider about wearing your sleep device: ? Anytime you are sleeping, including during daytime naps. ? While taking prescription pain medicines, sleeping medicines, or medicines that make you drowsy.  If you smoke, do not smoke without supervision.  Keep all follow-up visits as told by your health care provider. This is important. Contact a health care provider if:  You keep feeling nauseous or you keep vomiting.  You feel light-headed.  You develop a rash.  You have a fever. Get help right away if:  You have trouble breathing. Summary  For several hours after your procedure, you may feel sleepy and have poor judgment.  Have a responsible adult stay with you for at least 24 hours or until you are awake and alert. This information is not intended to replace advice given to you by your  health care provider. Make sure you discuss any  questions you have with your health care provider. Document Revised: 04/07/2017 Document Reviewed: 04/30/2015 Elsevier Patient Education  [place]. How to Use Chlorhexidine for Bathing Chlorhexidine gluconate (CHG) is a germ-killing (antiseptic) solution that is used to clean the skin. It can get rid of the bacteria that normally live on the skin and can keep them away for about 24 hours. To clean your skin with CHG, you may be given:  A CHG solution to use in the shower or as part of a sponge bath.  A prepackaged cloth that contains CHG. Cleaning your skin with CHG may help lower the risk for infection:  While you are staying in the intensive care unit of the hospital.  If you have a vascular access, such as a central line, to provide short-term or long-term access to your veins.  If you have a catheter to drain urine from your bladder.  If you are on a ventilator. A ventilator is a machine that helps you breathe by moving air in and out of your lungs.  After surgery. What are the risks? Risks of using CHG include:  A skin reaction.  Hearing loss, if CHG gets in your ears.  Eye injury, if CHG gets in your eyes and is not rinsed out.  The CHG product catching fire. Make sure that you avoid smoking and flames after applying CHG to your skin. Do not use CHG:  If you have a chlorhexidine allergy or have previously reacted to chlorhexidine.  On babies younger than 37 months of age. How to use CHG solution  Use CHG only as told by your health care provider, and follow the instructions on the label.  Use the full amount of CHG as directed. Usually, this is one bottle. During a shower Follow these steps when using CHG solution during a shower (unless your health care provider gives you different instructions): 2. Start the shower. 3. Use your normal soap and shampoo to wash your face and hair. 4. Turn off the shower or  move out of the shower stream. 5. Pour the CHG onto a clean washcloth. Do not use any type of brush or rough-edged sponge. 6. Starting at your neck, lather your body down to your toes. Make sure you follow these instructions: ? If you will be having surgery, pay special attention to the part of your body where you will be having surgery. Scrub this area for at least 1 minute. ? Do not use CHG on your head or face. If the solution gets into your ears or eyes, rinse them well with water. ? Avoid your genital area. ? Avoid any areas of skin that have broken skin, cuts, or scrapes. ? Scrub your back and under your arms. Make sure to wash skin folds. 7. Let the lather sit on your skin for 1-2 minutes or as long as told by your health care provider. 8. Thoroughly rinse your entire body in the shower. Make sure that all body creases and crevices are rinsed well. 9. Dry off with a clean towel. Do not put any substances on your body afterward--such as powder, lotion, or perfume--unless you are told to do so by your health care provider. Only use lotions that are recommended by the manufacturer. 10. Put on clean clothes or pajamas. 11. If it is the night before your surgery, sleep in clean sheets.  During a sponge bath Follow these steps when using CHG solution during a sponge bath (unless your health care provider gives  you different instructions): 3. Use your normal soap and shampoo to wash your face and hair. 4. Pour the CHG onto a clean washcloth. 5. Starting at your neck, lather your body down to your toes. Make sure you follow these instructions: ? If you will be having surgery, pay special attention to the part of your body where you will be having surgery. Scrub this area for at least 1 minute. ? Do not use CHG on your head or face. If the solution gets into your ears or eyes, rinse them well with water. ? Avoid your genital area. ? Avoid any areas of skin that have broken skin, cuts, or  scrapes. ? Scrub your back and under your arms. Make sure to wash skin folds. 6. Let the lather sit on your skin for 1-2 minutes or as long as told by your health care provider. 7. Using a different clean, wet washcloth, thoroughly rinse your entire body. Make sure that all body creases and crevices are rinsed well. 8. Dry off with a clean towel. Do not put any substances on your body afterward--such as powder, lotion, or perfume--unless you are told to do so by your health care provider. Only use lotions that are recommended by the manufacturer. 9. Put on clean clothes or pajamas. 10. If it is the night before your surgery, sleep in clean sheets. How to use CHG prepackaged cloths  Only use CHG cloths as told by your health care provider, and follow the instructions on the label.  Use the CHG cloth on clean, dry skin.  Do not use the CHG cloth on your head or face unless your health care provider tells you to.  When washing with the CHG cloth: ? Avoid your genital area. ? Avoid any areas of skin that have broken skin, cuts, or scrapes. Before surgery Follow these steps when using a CHG cloth to clean before surgery (unless your health care provider gives you different instructions): 6. Using the CHG cloth, vigorously scrub the part of your body where you will be having surgery. Scrub using a back-and-forth motion for 3 minutes. The area on your body should be completely wet with CHG when you are done scrubbing. 7. Do not rinse. Discard the cloth and let the area air-dry. Do not put any substances on the area afterward, such as powder, lotion, or perfume. 8. Put on clean clothes or pajamas. 9. If it is the night before your surgery, sleep in clean sheets.  For general bathing Follow these steps when using CHG cloths for general bathing (unless your health care provider gives you different instructions). 6. Use a separate CHG cloth for each area of your body. Make sure you wash between any  folds of skin and between your fingers and toes. Wash your body in the following order, switching to a new cloth after each step: ? The front of your neck, shoulders, and chest. ? Both of your arms, under your arms, and your hands. ? Your stomach and groin area, avoiding the genitals. ? Your right leg and foot. ? Your left leg and foot. ? The back of your neck, your back, and your buttocks. 7. Do not rinse. Discard the cloth and let the area air-dry. Do not put any substances on your body afterward--such as powder, lotion, or perfume--unless you are told to do so by your health care provider. Only use lotions that are recommended by the manufacturer. 8. Put on clean clothes or pajamas. Contact a health care  provider if:  Your skin gets irritated after scrubbing.  You have questions about using your solution or cloth. Get help right away if:  Your eyes become very red or swollen.  Your eyes itch badly.  Your skin itches badly and is red or swollen.  Your hearing changes.  You have trouble seeing.  You have swelling or tingling in your mouth or throat.  You have trouble breathing.  You swallow any chlorhexidine. Summary  Chlorhexidine gluconate (CHG) is a germ-killing (antiseptic) solution that is used to clean the skin. Cleaning your skin with CHG may help to lower your risk for infection.  You may be given CHG to use for bathing. It may be in a bottle or in a prepackaged cloth to use on your skin. Carefully follow your health care provider's instructions and the instructions on the product label.  Do not use CHG if you have a chlorhexidine allergy.  Contact your health care provider if your skin gets irritated after scrubbing. This information is not intended to replace advice given to you by your health care provider. Make sure you discuss any questions you have with your health care provider. Document Revised: 03/26/2018 Document Reviewed: 12/05/2016 Elsevier Patient  Education  [place].

## 2019-04-27 ENCOUNTER — Encounter (HOSPITAL_COMMUNITY)
Admission: RE | Admit: 2019-04-27 | Discharge: 2019-04-27 | Disposition: A | Discharge status: Home / Self Care | Payer: No Typology Code available for payment source | Source: Ambulatory Visit | Attending: Orthopedic Surgery | Admitting: Orthopedic Surgery

## 2019-04-27 ENCOUNTER — Other Ambulatory Visit: Payer: Self-pay

## 2019-04-27 ENCOUNTER — Encounter (HOSPITAL_COMMUNITY): Payer: Self-pay

## 2019-04-27 ENCOUNTER — Other Ambulatory Visit (HOSPITAL_COMMUNITY)
Admission: RE | Admit: 2019-04-27 | Discharge: 2019-04-27 | Disposition: A | Discharge status: Home / Self Care | Payer: No Typology Code available for payment source | Source: Ambulatory Visit | Attending: Orthopedic Surgery | Admitting: Orthopedic Surgery

## 2019-04-27 DIAGNOSIS — Z01812 Encounter for preprocedural laboratory examination: Secondary | ICD-10-CM | POA: Diagnosis not present

## 2019-04-27 DIAGNOSIS — Z01810 Encounter for preprocedural cardiovascular examination: Secondary | ICD-10-CM | POA: Diagnosis present

## 2019-04-27 DIAGNOSIS — Z20822 Contact with and (suspected) exposure to covid-19: Secondary | ICD-10-CM | POA: Insufficient documentation

## 2019-04-27 HISTORY — DX: Gout, unspecified: M10.9

## 2019-04-27 LAB — CBC WITH DIFFERENTIAL/PLATELET
Abs Immature Granulocytes: 0.03 10*3/uL (ref 0.00–0.07)
Basophils Absolute: 0.1 10*3/uL (ref 0.0–0.1)
Basophils Relative: 1 %
Eosinophils Absolute: 0.3 10*3/uL (ref 0.0–0.5)
Eosinophils Relative: 4 %
HCT: 44.8 % (ref 39.0–52.0)
Hemoglobin: 15 g/dL (ref 13.0–17.0)
Immature Granulocytes: 0 %
Lymphocytes Relative: 37 %
Lymphs Abs: 2.5 10*3/uL (ref 0.7–4.0)
MCH: 29.3 pg (ref 26.0–34.0)
MCHC: 33.5 g/dL (ref 30.0–36.0)
MCV: 87.5 fL (ref 80.0–100.0)
Monocytes Absolute: 0.3 10*3/uL (ref 0.1–1.0)
Monocytes Relative: 4 %
Neutro Abs: 3.7 10*3/uL (ref 1.7–7.7)
Neutrophils Relative %: 54 %
Platelets: 163 10*3/uL (ref 150–400)
RBC: 5.12 MIL/uL (ref 4.22–5.81)
RDW: 12.5 % (ref 11.5–15.5)
WBC: 6.9 10*3/uL (ref 4.0–10.5)
nRBC: 0 % (ref 0.0–0.2)

## 2019-04-27 LAB — BASIC METABOLIC PANEL
Anion gap: 11 (ref 5–15)
BUN: 14 mg/dL (ref 6–20)
CO2: 22 mmol/L (ref 22–32)
Calcium: 8.9 mg/dL (ref 8.9–10.3)
Chloride: 105 mmol/L (ref 98–111)
Creatinine, Ser: 0.99 mg/dL (ref 0.61–1.24)
GFR calc Af Amer: >60 mL/min (ref >60)
GFR calc non Af Amer: >60 mL/min (ref >60)
Glucose, Bld: 87 mg/dL (ref 70–99)
Potassium: 3.9 mmol/L (ref 3.5–5.1)
Sodium: 138 mmol/L (ref 135–145)

## 2019-04-28 LAB — SARS CORONAVIRUS 2 (TAT 6-24 HRS): SARS Coronavirus 2: NEGATIVE

## 2019-04-28 NOTE — H&P (Signed)
Chief Complaint  Patient presents with  . Follow-up      Recheck on left shoulder    Adam Cunningham is a 59 year old male presented with a 44-month history of left shoulder pain back in March 2020 he was treated with Ibuprofen subacromial injection and Codman exercises but did not improve   His initial insult was closing a car door.  He presented to [place] with those prior treatments, his injection was repeated in the subacromial space as well as the biceps tendon he started rotator cuff strengthening program continued ibuprofen   He returned on December 9 reinjected exercise program reinforced but he has not improved the shoulder is gotten worse he now has loss of motion in abduction and flexion he has a severe amount of crepitance and weakness along with pain primarily in the anterolateral portion of the shoulder   We have been unable to control his shoulder pain and he now presents for shoulder surgery for open cuff repair  Review of systems unexpected weight loss negative corrective lenses positive headache negative chest pain negative shortness of breath negative heartburn frequency negative poor healing negative numbness tingling negative anxiety negative easy bleeding negative excessive thirst urination negative  Past Surgical History:  Procedure Laterality Date  . BICEPS TENDON REPAIR    . PROSTATE SURGERY      Social History   Tobacco Use  . Smoking status: Never Smoker  . Smokeless tobacco: Never Used  Substance Use Topics  . Alcohol use: No  . Drug use: No    Family History  Problem Relation Age of Onset  . Cancer Mother        lung  . Cancer Father        prostate ca  . Cirrhosis Father        non-alcoholic  . Cancer Maternal Grandfather        Prostate  . Colon cancer Neg Hx   . Colon polyps Neg Hx   . Esophageal cancer Neg Hx   . Rectal cancer Neg Hx   . Stomach cancer Neg Hx          Past Medical History:  Diagnosis Date  . Cancer (HCC)      prostate    No  Known Allergies       BP 128/85   Pulse 70   Temp 97.7 F (36.5 C)   Ht 6\' 2"  (1.88 m)   BMI 31.30 kg/m   The patient has normal development nutrition mesomorphic body habitus no gross deformities well-groomed  Peripheral pulses are intact without varicose veins or swelling no edema no temperature issues  Cervical adenopathy negative  Gait and station are normal   Right shoulder full range of motion no tenderness right shoulder stable normal muscle strength skin intact   Left shoulder is tender in the front of the shoulder he does have equal internal rotation versus the right but his abduction is only 80 degrees his flexion is 120 degrees actively with passive flexion 180 with pain between 120 degrees in 180 degrees he has weakness in abduction 3 out of 5 in supraspinatus 3 out of 5.  Crepitance on palpation with no instability  Sensation is intact and normal skin is normal at the surgical site  He has normal orientation to time person and place mood and affect are normal he has no pathologic reflexes and he can move his upper extremities and normal coordinated fashion  His MRI shows a rotator cuff tear of the left  shoulder  CLINICAL DATA:  Left shoulder pain for the past 2 months.   EXAM: MRI OF THE LEFT SHOULDER WITHOUT CONTRAST   TECHNIQUE: Multiplanar, multisequence MR imaging of the shoulder was performed. No intravenous contrast was administered.   COMPARISON:  Left shoulder x-rays dated March 06, 2017.   FINDINGS: Rotator cuff: Full-thickness, near full width tear of the supraspinatus tendon with 2.5 cm retraction to the top of the humeral head. Mild infraspinatus and subscapularis tendinosis at their insertions. The teres minor tendon is unremarkable.   Muscles:  No muscle edema. Complete atrophy the teres minor muscle.   Biceps long head: Intact with severe intra-articular tendinosis and slight medial subluxation as it enters the bicipital groove.    Acromioclavicular Joint: Mild arthropathy of the acromioclavicular joint. Type II acromion. Small amount of fluid in synovitis in the subacromial/subdeltoid bursa.   Glenohumeral Joint: Small joint effusion. Mild diffuse cartilage thinning without focal defect.   Labrum: Posterosuperior and posteroinferior labral tear extending from 10-8 o'clock, with tiny paralabral cyst (series 5, images 11-16). Remaining labrum is intact.   Bones:  No marrow abnormality, fracture or dislocation.   Other: None.   IMPRESSION: 1. Full-thickness, near full width tear of the supraspinatus tendon with 2.5 cm retraction. No atrophy. 2. Mild infraspinatus and subscapularis tendinosis. 3. Severe intra-articular biceps tendinosis and slight medial subluxation as it enters the bicipital groove. 4. Posterosuperior and posteroinferior labral tear with tiny paralabral cyst. 5. Complete atrophy of the teres minor muscle. 6. Mild acromioclavicular and glenohumeral osteoarthritis.     Electronically Signed   By: Titus Dubin M.D.   On: 04/15/2019 09:09     Diagnosis torn rotator cuff right shoulder supraspinatus tendon with 2.5 cm retraction without atrophy  Other findings include tendinosis infraspinatus and subscapularis, bicipital tendinosis, superior and inferior posterior labral tear with tiny para labral cyst atrophy of teres minor muscle glenohumeral and mild AC joint arthrosis  Plan open rotator cuff repair

## 2019-04-29 ENCOUNTER — Ambulatory Visit (HOSPITAL_COMMUNITY): Payer: No Typology Code available for payment source | Admitting: Anesthesiology

## 2019-04-29 ENCOUNTER — Ambulatory Visit (HOSPITAL_COMMUNITY)
Admission: RE | Admit: 2019-04-29 | Discharge: 2019-04-29 | Disposition: A | Discharge status: Home / Self Care | Payer: No Typology Code available for payment source | Attending: Orthopedic Surgery | Admitting: Orthopedic Surgery

## 2019-04-29 ENCOUNTER — Encounter (HOSPITAL_COMMUNITY)
Admission: RE | Disposition: A | Discharge status: Home / Self Care | Payer: Self-pay | Source: Home / Self Care | Attending: Orthopedic Surgery

## 2019-04-29 ENCOUNTER — Encounter (HOSPITAL_COMMUNITY): Payer: Self-pay | Admitting: Orthopedic Surgery

## 2019-04-29 DIAGNOSIS — M659 Synovitis and tenosynovitis, unspecified: Secondary | ICD-10-CM | POA: Insufficient documentation

## 2019-04-29 DIAGNOSIS — M19012 Primary osteoarthritis, left shoulder: Secondary | ICD-10-CM | POA: Insufficient documentation

## 2019-04-29 DIAGNOSIS — M25412 Effusion, left shoulder: Secondary | ICD-10-CM | POA: Diagnosis not present

## 2019-04-29 DIAGNOSIS — S43432A Superior glenoid labrum lesion of left shoulder, initial encounter: Secondary | ICD-10-CM | POA: Insufficient documentation

## 2019-04-29 DIAGNOSIS — M75122 Complete rotator cuff tear or rupture of left shoulder, not specified as traumatic: Secondary | ICD-10-CM | POA: Diagnosis present

## 2019-04-29 DIAGNOSIS — X509XXA Other and unspecified overexertion or strenuous movements or postures, initial encounter: Secondary | ICD-10-CM | POA: Insufficient documentation

## 2019-04-29 DIAGNOSIS — S46012A Strain of muscle(s) and tendon(s) of the rotator cuff of left shoulder, initial encounter: Secondary | ICD-10-CM

## 2019-04-29 DIAGNOSIS — Z8546 Personal history of malignant neoplasm of prostate: Secondary | ICD-10-CM | POA: Insufficient documentation

## 2019-04-29 HISTORY — PX: SHOULDER OPEN ROTATOR CUFF REPAIR: SHX2407

## 2019-04-29 SURGERY — REPAIR, ROTATOR CUFF, OPEN
Anesthesia: General | Site: Shoulder | Laterality: Left

## 2019-04-29 MED ORDER — OXYCODONE HCL 5 MG PO TABS
5.0000 mg | ORAL_TABLET | Freq: Once | ORAL | Status: AC
  Administered 2019-04-29: 14:00:00 5 mg via ORAL
  Filled 2019-04-29: qty 1

## 2019-04-29 MED ORDER — DEXAMETHASONE SODIUM PHOSPHATE 4 MG/ML IJ SOLN
INTRAMUSCULAR | Status: DC | PRN
  Administered 2019-04-29: 6 mg via PERINEURAL

## 2019-04-29 MED ORDER — LACTATED RINGERS IV SOLN: INTRAVENOUS | Status: DC | PRN

## 2019-04-29 MED ORDER — SUCCINYLCHOLINE CHLORIDE 200 MG/10ML IV SOSY
PREFILLED_SYRINGE | INTRAVENOUS | Status: AC
  Filled 2019-04-29: qty 10

## 2019-04-29 MED ORDER — PROMETHAZINE HCL 12.5 MG PO TABS: 12.5000 mg | ORAL_TABLET | Freq: Four times a day (QID) | ORAL | 0 refills | Status: DC | PRN

## 2019-04-29 MED ORDER — BUPIVACAINE HCL (PF) 0.25 % IJ SOLN
INTRAMUSCULAR | Status: AC
  Filled 2019-04-29: qty 30

## 2019-04-29 MED ORDER — MEPERIDINE HCL 50 MG/ML IJ SOLN: 6.2500 mg | INTRAMUSCULAR | Status: DC | PRN

## 2019-04-29 MED ORDER — ONDANSETRON HCL 4 MG/2ML IJ SOLN
INTRAMUSCULAR | Status: DC | PRN
  Administered 2019-04-29: 4 mg via INTRAVENOUS

## 2019-04-29 MED ORDER — BUPIVACAINE HCL (PF) 0.25 % IJ SOLN
INTRAMUSCULAR | Status: DC | PRN
  Administered 2019-04-29: 10 mL

## 2019-04-29 MED ORDER — FENTANYL CITRATE (PF) 100 MCG/2ML IJ SOLN
INTRAMUSCULAR | Status: DC | PRN
  Administered 2019-04-29: 100 ug via INTRAVENOUS

## 2019-04-29 MED ORDER — SUGAMMADEX SODIUM 200 MG/2ML IV SOLN
INTRAVENOUS | Status: DC | PRN
  Administered 2019-04-29: 200 mg via INTRAVENOUS

## 2019-04-29 MED ORDER — FENTANYL CITRATE (PF) 250 MCG/5ML IJ SOLN
INTRAMUSCULAR | Status: AC
  Filled 2019-04-29: qty 5

## 2019-04-29 MED ORDER — ROCURONIUM BROMIDE 10 MG/ML (PF) SYRINGE
PREFILLED_SYRINGE | INTRAVENOUS | Status: DC | PRN
  Administered 2019-04-29: 50 mg via INTRAVENOUS
  Administered 2019-04-29: 20 mg via INTRAVENOUS

## 2019-04-29 MED ORDER — DEXAMETHASONE SODIUM PHOSPHATE 10 MG/ML IJ SOLN
INTRAMUSCULAR | Status: AC
  Filled 2019-04-29: qty 1

## 2019-04-29 MED ORDER — LIDOCAINE 2% (20 MG/ML) 5 ML SYRINGE
INTRAMUSCULAR | Status: AC
  Filled 2019-04-29: qty 5

## 2019-04-29 MED ORDER — LIDOCAINE HCL (PF) 1 % IJ SOLN
INTRAMUSCULAR | Status: DC | PRN
  Administered 2019-04-29: 3 mL

## 2019-04-29 MED ORDER — PROPOFOL 10 MG/ML IV BOLUS
INTRAVENOUS | Status: AC
  Filled 2019-04-29: qty 20

## 2019-04-29 MED ORDER — MIDAZOLAM HCL 2 MG/2ML IJ SOLN
INTRAMUSCULAR | Status: AC
  Filled 2019-04-29: qty 2

## 2019-04-29 MED ORDER — DEXAMETHASONE SODIUM PHOSPHATE 4 MG/ML IJ SOLN
INTRAMUSCULAR | Status: AC
  Filled 2019-04-29: qty 2

## 2019-04-29 MED ORDER — EPHEDRINE SULFATE 50 MG/ML IJ SOLN
INTRAMUSCULAR | Status: DC | PRN
  Administered 2019-04-29: 10 mg via INTRAVENOUS
  Administered 2019-04-29 (×2): 5 mg via INTRAVENOUS

## 2019-04-29 MED ORDER — DEXAMETHASONE SODIUM PHOSPHATE 4 MG/ML IJ SOLN: INTRAMUSCULAR | Status: DC | PRN

## 2019-04-29 MED ORDER — LACTATED RINGERS IV SOLN
Freq: Once | INTRAVENOUS | Status: AC
  Administered 2019-04-29: 1000 mL via INTRAVENOUS

## 2019-04-29 MED ORDER — BUPIVACAINE-EPINEPHRINE (PF) 0.5% -1:200000 IJ SOLN
INTRAMUSCULAR | Status: AC
  Filled 2019-04-29: qty 30

## 2019-04-29 MED ORDER — MIDAZOLAM HCL 2 MG/2ML IJ SOLN
2.0000 mg | Freq: Once | INTRAMUSCULAR | Status: AC
  Administered 2019-04-29: 2 mg via INTRAVENOUS
  Filled 2019-04-29: qty 2

## 2019-04-29 MED ORDER — ONDANSETRON HCL 4 MG/2ML IJ SOLN
4.0000 mg | Freq: Once | INTRAMUSCULAR | Status: AC
  Administered 2019-04-29: 14:00:00 4 mg via INTRAVENOUS
  Filled 2019-04-29: qty 2

## 2019-04-29 MED ORDER — ROCURONIUM BROMIDE 10 MG/ML (PF) SYRINGE
PREFILLED_SYRINGE | INTRAVENOUS | Status: AC
  Filled 2019-04-29: qty 10

## 2019-04-29 MED ORDER — OXYCODONE-ACETAMINOPHEN 5-325 MG PO TABS: 1.0000 | ORAL_TABLET | ORAL | 0 refills | Status: DC | PRN

## 2019-04-29 MED ORDER — BUPIVACAINE-EPINEPHRINE (PF) 0.5% -1:200000 IJ SOLN
INTRAMUSCULAR | Status: DC | PRN
  Administered 2019-04-29: 24 mL via PERINEURAL

## 2019-04-29 MED ORDER — LIDOCAINE HCL (PF) 1 % IJ SOLN
INTRAMUSCULAR | Status: AC
  Filled 2019-04-29: qty 30

## 2019-04-29 MED ORDER — EPHEDRINE 5 MG/ML INJ
INTRAVENOUS | Status: AC
  Filled 2019-04-29: qty 10

## 2019-04-29 MED ORDER — CELECOXIB 400 MG PO CAPS
400.0000 mg | ORAL_CAPSULE | Freq: Once | ORAL | Status: AC
  Administered 2019-04-29: 14:00:00 400 mg via ORAL
  Filled 2019-04-29: qty 1

## 2019-04-29 MED ORDER — PROMETHAZINE HCL 25 MG/ML IJ SOLN: 6.2500 mg | INTRAMUSCULAR | Status: DC | PRN

## 2019-04-29 MED ORDER — METHOCARBAMOL 1000 MG/10ML IJ SOLN
500.0000 mg | Freq: Once | INTRAVENOUS | Status: AC
  Administered 2019-04-29: 14:00:00 500 mg via INTRAVENOUS
  Filled 2019-04-29: qty 5

## 2019-04-29 MED ORDER — METHOCARBAMOL 500 MG PO TABS: 500.0000 mg | ORAL_TABLET | Freq: Four times a day (QID) | ORAL | 1 refills | Status: DC

## 2019-04-29 MED ORDER — HYDROMORPHONE HCL 1 MG/ML IJ SOLN: 0.2500 mg | INTRAMUSCULAR | Status: DC | PRN

## 2019-04-29 MED ORDER — PHENYLEPHRINE HCL (PRESSORS) 10 MG/ML IV SOLN
INTRAVENOUS | Status: DC | PRN
  Administered 2019-04-29 (×3): 100 ug via INTRAVENOUS
  Administered 2019-04-29 (×2): 80 ug via INTRAVENOUS
  Administered 2019-04-29: 100 ug via INTRAVENOUS
  Administered 2019-04-29: 80 ug via INTRAVENOUS
  Administered 2019-04-29: 100 ug via INTRAVENOUS

## 2019-04-29 MED ORDER — PHENYLEPHRINE 40 MCG/ML (10ML) SYRINGE FOR IV PUSH (FOR BLOOD PRESSURE SUPPORT)
PREFILLED_SYRINGE | INTRAVENOUS | Status: AC
  Filled 2019-04-29: qty 10

## 2019-04-29 MED ORDER — CEFAZOLIN SODIUM-DEXTROSE 2-4 GM/100ML-% IV SOLN
2.0000 g | INTRAVENOUS | Status: AC
  Administered 2019-04-29: 2 g via INTRAVENOUS
  Filled 2019-04-29: qty 100

## 2019-04-29 MED ORDER — ONDANSETRON HCL 4 MG/2ML IJ SOLN
INTRAMUSCULAR | Status: AC
  Filled 2019-04-29: qty 2

## 2019-04-29 MED ORDER — 0.9 % SODIUM CHLORIDE (POUR BTL) OPTIME
TOPICAL | Status: DC | PRN
  Administered 2019-04-29: 11:00:00 1000 mL

## 2019-04-29 MED ORDER — SUCCINYLCHOLINE CHLORIDE 20 MG/ML IJ SOLN
INTRAMUSCULAR | Status: DC | PRN
  Administered 2019-04-29: 140 mg via INTRAVENOUS

## 2019-04-29 MED ORDER — IBUPROFEN 800 MG PO TABS: 800.0000 mg | ORAL_TABLET | Freq: Three times a day (TID) | ORAL | 0 refills | Status: AC | PRN

## 2019-04-29 MED ORDER — DEXAMETHASONE SODIUM PHOSPHATE 10 MG/ML IJ SOLN
INTRAMUSCULAR | Status: DC | PRN
  Administered 2019-04-29: 10 mg via INTRAVENOUS

## 2019-04-29 MED ORDER — PROPOFOL 10 MG/ML IV BOLUS
INTRAVENOUS | Status: DC | PRN
  Administered 2019-04-29: 200 mg via INTRAVENOUS

## 2019-04-29 MED ORDER — PREGABALIN 50 MG PO CAPS
50.0000 mg | ORAL_CAPSULE | Freq: Once | ORAL | Status: AC
  Administered 2019-04-29: 14:00:00 50 mg via ORAL
  Filled 2019-04-29: qty 1

## 2019-04-29 MED FILL — OXYCODONE-APAP 5-325MG: 5-325 | 7 days supply | Qty: 42 | Fill #0

## 2019-04-29 MED FILL — METHOCARBAMOL 500 MG TABS: 500 | 15 days supply | Qty: 60 | Fill #0

## 2019-04-29 MED FILL — IBUPROFEN 800 MG TAB: 800 | 10 days supply | Qty: 30 | Fill #0

## 2019-04-29 MED FILL — PROMETHAZINE 12.5 MG TABLET: 12.5 | 7 days supply | Qty: 30 | Fill #0

## 2019-04-29 SURGICAL SUPPLY — 50 items
BENZOIN TINCTURE PRP APPL 2/3 (GAUZE/BANDAGES/DRESSINGS) ×2 IMPLANT
BIT DRILL 2.0X128 (BIT) ×2 IMPLANT
BLADE HEX COATED 2.75 (ELECTRODE) ×2 IMPLANT
BLADE OSC/SAGITTAL MD 9X18.5 (BLADE) ×2 IMPLANT
CHLORAPREP W/TINT 26 (MISCELLANEOUS) ×2 IMPLANT
CLOTH BEACON ORANGE TIMEOUT ST (SAFETY) ×2 IMPLANT
COVER LIGHT HANDLE STERIS (MISCELLANEOUS) ×4 IMPLANT
COVER WAND RF STERILE (DRAPES) ×2 IMPLANT
DECANTER SPIKE VIAL GLASS SM (MISCELLANEOUS) ×2 IMPLANT
DRAPE ORTHO 2.5IN SPLIT 77X108 (DRAPES) ×2 IMPLANT
DRAPE ORTHO SPLIT 77X108 STRL (DRAPES) ×4
DRESSING MEPILEX BORDER 6X8 (GAUZE/BANDAGES/DRESSINGS) ×1 IMPLANT
DRSG MEPILEX BORDER 6X8 (GAUZE/BANDAGES/DRESSINGS) ×2
ELECT REM PT RETURN 9FT ADLT (ELECTROSURGICAL) ×2
ELECTRODE REM PT RTRN 9FT ADLT (ELECTROSURGICAL) ×1 IMPLANT
GLOVE BIO SURGEON STRL SZ7 (GLOVE) ×2 IMPLANT
GLOVE BIOGEL PI IND STRL 7.0 (GLOVE) ×3 IMPLANT
GLOVE BIOGEL PI INDICATOR 7.0 (GLOVE) ×3
GLOVE ECLIPSE 6.5 STRL STRAW (GLOVE) ×2 IMPLANT
GLOVE SKINSENSE NS SZ8.0 LF (GLOVE) ×1
GLOVE SKINSENSE STRL SZ8.0 LF (GLOVE) ×1 IMPLANT
GLOVE SS N UNI LF 8.5 STRL (GLOVE) ×2 IMPLANT
GOWN STRL REUS W/TWL LRG LVL3 (GOWN DISPOSABLE) ×4 IMPLANT
GOWN STRL REUS W/TWL XL LVL3 (GOWN DISPOSABLE) ×2 IMPLANT
IMPL SPEEDBRIDGE KIT (Orthopedic Implant) ×1 IMPLANT
IMPLANT SPEEDBRIDGE KIT (Orthopedic Implant) ×2 IMPLANT
INST SET MINOR BONE (KITS) ×2 IMPLANT
KIT BLADEGUARD II DBL (SET/KITS/TRAYS/PACK) ×2 IMPLANT
KIT SURGICAL DEVON (SET/KITS/TRAYS/PACK) ×2 IMPLANT
KIT TURNOVER KIT A (KITS) ×2 IMPLANT
MANIFOLD NEPTUNE II (INSTRUMENTS) ×2 IMPLANT
MARKER SKIN DUAL TIP RULER LAB (MISCELLANEOUS) ×2 IMPLANT
NEEDLE HYPO 21X1.5 SAFETY (NEEDLE) ×2 IMPLANT
NEEDLE MA TROC 1/2 (NEEDLE) ×2 IMPLANT
NS IRRIG 1000ML POUR BTL (IV SOLUTION) ×2 IMPLANT
PACK TOTAL JOINT (CUSTOM PROCEDURE TRAY) ×2 IMPLANT
PAD ARMBOARD 7.5X6 YLW CONV (MISCELLANEOUS) ×2 IMPLANT
PENCIL SMOKE EVACUATOR (MISCELLANEOUS) ×2 IMPLANT
RASP SM TEAR CROSS CUT (RASP) ×2 IMPLANT
SET BASIN LINEN APH (SET/KITS/TRAYS/PACK) ×2 IMPLANT
SLING ARM FOAM STRAP XLG (SOFTGOODS) ×2 IMPLANT
STRIP CLOSURE SKIN 1/2X4 (GAUZE/BANDAGES/DRESSINGS) ×2 IMPLANT
SUT ETHIBOND NAB OS 4 #2 30IN (SUTURE) ×10 IMPLANT
SUT MON AB 0 CT1 (SUTURE) ×2 IMPLANT
SUT MON AB 2-0 CT1 36 (SUTURE) ×2 IMPLANT
SYR BULB IRRIGATION 50ML (SYRINGE) ×2 IMPLANT
SYR CONTROL 10ML LL (SYRINGE) ×2 IMPLANT
TISSUE ARTHOFLEX  1.26-1.75MM (Tissue) ×2 IMPLANT
TISSUE ARTHOFLEX 1.26-1.75MM (Tissue) ×1 IMPLANT
YANKAUER SUCT 12FT TUBE ARGYLE (SUCTIONS) ×2 IMPLANT

## 2019-04-29 NOTE — Anesthesia Procedure Notes (Signed)
Anesthesia Regional Block: Interscalene brachial plexus block   Pre-Anesthetic Checklist: ,, timeout performed, Correct Patient, Correct Site, Correct Laterality, Correct Procedure, Correct Position, site marked, Risks and benefits discussed, at surgeon's request and post-op pain management  Laterality: Upper and Left  Prep: chloraprep       Needles:  Injection technique: Single-shot  Needle Type: Echogenic Stimulator Needle     Needle Length: 9cm  Needle Gauge: 22   Needle insertion depth: 5 cm   Additional Needles:   Procedures:, nerve stimulator,,,,,,,   Nerve Stimulator or Paresthesia:  Response: Twitch elicited, 0.5 mA, 0.3 ms, 5 cm  Additional Responses:   Narrative:  Start time: 04/29/2019 10:05 AM End time: 04/29/2019 10:14 AM  Performed by: Personally  Anesthesiologist: Denese Killings, MD  Additional Notes: left interscalene block - bupivacaine 0.5% 24 ml with dexamethasone 4 mg, left superficial cervical plexus block 6 ml with dexamethasone 2 mg, Block assessed prior to start of surgery,

## 2019-04-29 NOTE — Interval H&P Note (Signed)
History and Physical Interval Note:  04/29/2019 10:34 AM  Adam Cunningham.  has presented today for surgery, with the diagnosis of rotator cuff tear left shoulder.  The various methods of treatment have been discussed with the patient and family. After consideration of risks, benefits and other options for treatment, the patient has consented to  Procedure(s) with comments: OPEN ROTATOR CUFF REPAIR LEFT SHOULDER (Left) - and interscalene block  as a surgical intervention.  The patient's history has been reviewed, patient examined, no change in status, stable for surgery.  I have reviewed the patient's chart and labs.  Questions were answered to the patient's satisfaction.     Arther Abbott

## 2019-04-29 NOTE — Transfer of Care (Signed)
Immediate Anesthesia Transfer of Care Note  Patient: Adam Cunningham.  Procedure(s) Performed: OPEN ROTATOR CUFF REPAIR LEFT SHOULDER (Left Shoulder)  Patient Location: PACU  Anesthesia Type:General  Level of Consciousness: awake, alert , oriented and patient cooperative  Airway & Oxygen Therapy: Patient Spontanous Breathing  Post-op Assessment: Report given to RN and Post -op Vital signs reviewed and stable  Post vital signs: Reviewed and stable  Last Vitals:  Vitals Value Taken Time  BP 124/80 04/29/19 1323  Temp    Pulse 80 04/29/19 1326  Resp 19 04/29/19 1326  SpO2 96 % 04/29/19 1326  Vitals shown include unvalidated device data.  Last Pain:  Vitals:   04/29/19 0950  TempSrc: Oral  PainSc: 0-No pain      Patients Stated Pain Goal: 9 ([id number])  Complications: No apparent anesthesia complications

## 2019-04-29 NOTE — Anesthesia Postprocedure Evaluation (Signed)
Anesthesia Post Note  Patient: Adam Cunningham.  Procedure(s) Performed: OPEN ROTATOR CUFF REPAIR LEFT SHOULDER (Left Shoulder)  Patient location during evaluation: PACU Anesthesia Type: General Level of consciousness: awake, oriented, awake and alert and patient cooperative Pain management: pain level controlled Vital Signs Assessment: post-procedure vital signs reviewed and stable Respiratory status: spontaneous breathing, respiratory function stable and nonlabored ventilation Cardiovascular status: stable Postop Assessment: no apparent nausea or vomiting Anesthetic complications: no     Last Vitals:  Vitals:   04/29/19 0950  BP: 130/90  Pulse: 70  Resp: 20  Temp: 36.8 C  SpO2: 95%    Last Pain:  Vitals:   04/29/19 0950  TempSrc: Oral  PainSc: 0-No pain                 Bryauna Byrum

## 2019-04-29 NOTE — Anesthesia Procedure Notes (Signed)
Procedure Name: Intubation Date/Time: 04/29/2019 10:46 AM Performed by: Jonna Munro, CRNA Pre-anesthesia Checklist: Patient identified, Emergency Drugs available, Suction available, Patient being monitored and Timeout performed Patient Re-evaluated:Patient Re-evaluated prior to induction Oxygen Delivery Method: Circle system utilized Preoxygenation: Pre-oxygenation with 100% oxygen Induction Type: IV induction Ventilation: Mask ventilation without difficulty Laryngoscope Size: Glidescope and 3 Grade View: Grade I Tube type: Oral Number of attempts: 1 Airway Equipment and Method: Stylet and Video-laryngoscopy Placement Confirmation: ETT inserted through vocal cords under direct vision,  positive ETCO2 and breath sounds checked- equal and bilateral Secured at: 23 cm Tube secured with: Tape Dental Injury: Teeth and Oropharynx as per pre-operative assessment

## 2019-04-29 NOTE — Anesthesia Preprocedure Evaluation (Signed)
Anesthesia Evaluation  Patient identified by MRN, date of birth, ID band Patient awake    Reviewed: Allergy & Precautions, NPO status , Patient's Chart, lab work & pertinent test results  History of Anesthesia Complications Negative for: history of anesthetic complications  Airway Mallampati: III  TM Distance: >3 FB Neck ROM: Full    Dental  (+) Teeth Intact, Dental Advisory Given   Pulmonary neg pulmonary ROS,    Pulmonary exam normal breath sounds clear to auscultation       Cardiovascular Exercise Tolerance: Good Normal cardiovascular exam Rhythm:Regular Rate:Normal     Neuro/Psych negative neurological ROS  negative psych ROS   GI/Hepatic negative GI ROS, Neg liver ROS,   Endo/Other  negative endocrine ROS  Renal/GU negative Renal ROS   Prostate cancer  negative genitourinary   Musculoskeletal  (+) Arthritis  (gout), Gout    Abdominal   Peds negative pediatric ROS (+)  Hematology negative hematology ROS (+)   Anesthesia Other Findings   Reproductive/Obstetrics negative OB ROS                           Anesthesia Physical Anesthesia Plan  ASA: II  Anesthesia Plan: General   Post-op Pain Management:  Regional for Post-op pain   Induction: Intravenous  PONV Risk Score and Plan: 3 and Ondansetron, Dexamethasone, Midazolam and Treatment may vary due to age or medical condition  Airway Management Planned: Oral ETT  Additional Equipment:   Intra-op Plan:   Post-operative Plan: Extubation in OR  Informed Consent: I have reviewed the patients History and Physical, chart, labs and discussed the procedure including the risks, benefits and alternatives for the proposed anesthesia with the patient or authorized representative who has indicated his/her understanding and acceptance.     Dental advisory given  Plan Discussed with: CRNA and Surgeon  Anesthesia Plan Comments:         Anesthesia Quick Evaluation

## 2019-04-29 NOTE — Op Note (Signed)
04/29/2019  1:19 PM  PATIENT:  Adam Cunningham.  59 y.o. male  PRE-OPERATIVE DIAGNOSIS:  rotator cuff tear left shoulder  POST-OPERATIVE DIAGNOSIS:  rotator cuff tear left shoulder  PROCEDURE:  Procedure(s) with comments: OPEN ROTATOR CUFF REPAIR LEFT SHOULDER with dermal acellular graft from Arthrex 1.5 thickness (Left) - and interscalene block  Cpt 23420   Findings at surgery massive cuff tear supraspinatus into the infraspinatus large humeral head defect with posterior subluxation of the rotator cuff 5 cm retraction posteriorly  Implants Arthrex speed bridge dermal acellular graft from Arthrex 1.5 thickness   SURGEON:  Surgeon(s) and Role:    Carole Civil, MD - Primary  PHYSICIAN ASSISTANT:   ASSISTANTS: Corrie Dandy  ANESTHESIA:   + Interscalene block interscalene block and general anesthesia and 10 cc of Marcaine with epinephrine quarter percent  EBL:  33mL   BLOOD ADMINISTERED:none  DRAINS: none   LOCAL MEDICATIONS USED:  MARCAINE     SPECIMEN:  No Specimen  DISPOSITION OF SPECIMEN:  N/A  COUNTS:  YES  TOURNIQUET:  * No tourniquets in log *  DICTATION: .Dragon Dictation  PLAN OF CARE: Discharge to home after PACU  PATIENT DISPOSITION:  PACU - hemodynamically stable.   Delay start of Pharmacological VTE agent (>24hrs) due to surgical blood loss or risk of bleeding: not applicable  Surgical details  The patient was seen in preop surgical site was confirmed chart review was completed surgical site marked left shoulder chart review and updated complete  Patient given interscalene block patient taken to surgery 2 g of Ancef given general anesthesia was performed  Before prep and drape the patient was examined for range of motion deficits.  There were no range of motion deficits.  He was then prepped and draped sterilely and timeout was completed  I made a standard rotator cuff incision from the anterolateral acromion to the coracoid  subcutaneous tissue was divided and the fascia was dissected from the overlying skin.  The anterolateral acromion was palpated and the deltoid was split from the corner and detached anteriorly and 1 cm posteriorly  Thick bursal tissue was excised.  The humeral head defect extended from the biceps tendon posteriorly approximately 5 cm.  The biceps tendon was pulled out of the bicipital groove and showed some inflammation but there was no tear in the tendon itself and it was left intact  There was degeneration of the subscapularis anterior to the biceps tendon but there was no insertional tearing the tearing was intrasubstance and longitudinal.  I placed a #2 Ethibond suture in the rotator cuff and mobilized it by placing 2 additional sutures and then with superior and inferior blunt and sharp dissection and a rotator interval split the tendon was brought over to the greater tuberosity.  However, the tissue was of poor quality.  It was debrided.  I decided to only medialize the insertion for the repair.  I prepared the greater tuberosity by debriding the soft tissue and drilling three 2.0 holes in the area medial to the greater tuberosity in debrided 5 mm of articular cartilage to prepare the bone for the tendon.  I also used a rasp to create more bleeding surface.    The cuff was reduced by externally rotating the arm and bringing the cuff tissue from posterior to anterior.  We first placed to anchors from the speed bridge and then passed the sutures through the rotator cuff and through the graft which was prepared by measuring the  width of the first 2 anchors in the length from medial to lateral.  The graft was prepared and trimmed and then passed with a passing suture.  We then tied the medial row and then tied the safety sutures to anchor the medial portion of the graft.  We then brought the graft laterally and anchored it with a speed bridge construct using 2 additional anchors.  We used some of the  initial #2 Ethibond sutures to repair the anterior and posterior portions of the graft to the remaining cuff tissue and then repaired any dogears with additional suture  The repair had excellent reapproximation laterally and to the bone.  Range of motion did not stress the repair and the repair was done with his arm at his side  We did do an acromioplasty we remove the anterior portion of the acromion superior to inferior and then anterior to posterior we took additional bone and then contoured it with a rasp.  We also contoured the lateral edge of the acromion to aid in abduction  The wound was irrigated.  A drill hole was placed in the acromion #2 Ethibond suture was passed and the deltoid split was repaired back to bone.  Additional #2 Ethibond sutures were used to close the split.  The subcutaneous tissue was closed with 0 Monocryl and 2-0 running subcuticular stitch and reapproximated with benzoin and Steri-Strips.  We did inject 10 cc of Marcaine with epinephrine in the subcutaneous tissue  A sling was applied and the patient was taken to recovery room in stable condition  The patient postoperatively Rest for 6 weeks arm in a sling  At 6 weeks we can start a rotator cuff program with 0 to 6 weeks of passive range of motion 7 to 12 weeks of active assisted range of motion at 12 weeks from therapy he can start progressive resistance exercises

## 2019-04-29 NOTE — Brief Op Note (Signed)
04/29/2019  1:19 PM  PATIENT:  Adam Cunningham.  59 y.o. male  PRE-OPERATIVE DIAGNOSIS:  rotator cuff tear left shoulder  POST-OPERATIVE DIAGNOSIS:  rotator cuff tear left shoulder  PROCEDURE:  Procedure(s) with comments: OPEN ROTATOR CUFF REPAIR LEFT SHOULDER with dermal acellular graft from Arthrex 1.5 thickness (Left) - and interscalene block  Cpt 23420   Findings at surgery massive cuff tear supraspinatus into the infraspinatus large humeral head defect with posterior subluxation of the rotator cuff 5 cm retraction posteriorly  Implants Arthrex speed bridge dermal acellular graft from Arthrex 1.5 thickness   SURGEON:  Surgeon(s) and Role:    Carole Civil, MD - Primary  PHYSICIAN ASSISTANT:   ASSISTANTS: Corrie Dandy  ANESTHESIA:   + Interscalene block interscalene block and general anesthesia and 10 cc of Marcaine with epinephrine quarter percent  EBL:  42mL   BLOOD ADMINISTERED:none  DRAINS: none   LOCAL MEDICATIONS USED:  MARCAINE     SPECIMEN:  No Specimen  DISPOSITION OF SPECIMEN:  N/A  COUNTS:  YES  TOURNIQUET:  * No tourniquets in log *  DICTATION: .Dragon Dictation  PLAN OF CARE: Discharge to home after PACU  PATIENT DISPOSITION:  PACU - hemodynamically stable.   Delay start of Pharmacological VTE agent (>24hrs) due to surgical blood loss or risk of bleeding: not applicable  Surgical details  The patient was seen in preop surgical site was confirmed chart review was completed surgical site marked left shoulder chart review and updated complete  Patient given interscalene block patient taken to surgery 2 g of Ancef given general anesthesia was performed  Before prep and drape the patient was examined for range of motion deficits.  There were no range of motion deficits.  He was then prepped and draped sterilely and timeout was completed  I made a standard rotator cuff incision from the anterolateral acromion to the coracoid  subcutaneous tissue was divided and the fascia was dissected from the overlying skin.  The anterolateral acromion was palpated and the deltoid was split from the corner and detached anteriorly and 1 cm posteriorly  Thick bursal tissue was excised.  The humeral head defect extended from the biceps tendon posteriorly approximately 5 cm.  The biceps tendon was pulled out of the bicipital groove and showed some inflammation but there was no tear in the tendon itself and it was left intact  There was degeneration of the subscapularis anterior to the biceps tendon but there was no insertional tearing the tearing was intrasubstance and longitudinal.  I placed a #2 Ethibond suture in the rotator cuff and mobilized it by placing 2 additional sutures and then with superior and inferior blunt and sharp dissection and a rotator interval split the tendon was brought over to the greater tuberosity.  However, the tissue was of poor quality.  It was debrided.  I decided to only medialize the insertion for the repair.  I prepared the greater tuberosity by debriding the soft tissue and drilling three 2.0 holes in the area medial to the greater tuberosity in debrided 5 mm of articular cartilage to prepare the bone for the tendon.  I also used a rasp to create more bleeding surface.    The cuff was reduced by externally rotating the arm and bringing the cuff tissue from posterior to anterior.  We first placed to anchors from the speed bridge and then passed the sutures through the rotator cuff and through the graft which was prepared by measuring the  width of the first 2 anchors in the length from medial to lateral.  The graft was prepared and trimmed and then passed with a passing suture.  We then tied the medial row and then tied the safety sutures to anchor the medial portion of the graft.  We then brought the graft laterally and anchored it with a speed bridge construct using 2 additional anchors.  We used some of the  initial #2 Ethibond sutures to repair the anterior and posterior portions of the graft to the remaining cuff tissue and then repaired any dogears with additional suture  The repair had excellent reapproximation laterally and to the bone.  Range of motion did not stress the repair and the repair was done with his arm at his side  We did do an acromioplasty we remove the anterior portion of the acromion superior to inferior and then anterior to posterior we took additional bone and then contoured it with a rasp.  We also contoured the lateral edge of the acromion to aid in abduction  The wound was irrigated.  A drill hole was placed in the acromion #2 Ethibond suture was passed and the deltoid split was repaired back to bone.  Additional #2 Ethibond sutures were used to close the split.  The subcutaneous tissue was closed with 0 Monocryl and 2-0 running subcuticular stitch and reapproximated with benzoin and Steri-Strips.  We did inject 10 cc of Marcaine with epinephrine in the subcutaneous tissue  A sling was applied and the patient was taken to recovery room in stable condition  The patient postoperatively Rest for 6 weeks arm in a sling  At 6 weeks we can start a rotator cuff program with 0 to 6 weeks of passive range of motion 7 to 12 weeks of active assisted range of motion at 12 weeks from therapy he can start progressive resistance exercises

## 2019-05-07 ENCOUNTER — Ambulatory Visit (INDEPENDENT_AMBULATORY_CARE_PROVIDER_SITE_OTHER): Payer: No Typology Code available for payment source | Admitting: Orthopedic Surgery

## 2019-05-07 ENCOUNTER — Other Ambulatory Visit: Payer: Self-pay

## 2019-05-07 VITALS — Ht 74.0 in | Wt 243.0 lb

## 2019-05-07 DIAGNOSIS — Z9889 Other specified postprocedural states: Secondary | ICD-10-CM

## 2019-05-07 MED ORDER — MELOXICAM 7.5 MG PO TABS: 7.5000 mg | ORAL_TABLET | Freq: Every day | ORAL | 5 refills | Status: DC

## 2019-05-07 MED FILL — MELOXICAM 7.5 MG TABLET: 7.5 | 30 days supply | Qty: 30 | Fill #0

## 2019-05-07 NOTE — Progress Notes (Signed)
Chief Complaint  Patient presents with  . Follow-up    Recheck on right shoulder, DOS 04-29-19.    Encounter Diagnosis  Name Primary?  . S/P left rotator cuff repair Yes    Postop visit #[id number]  58 year old male had an open rotator cuff repair left shoulder with dermal acellular graft from Arthrex the 1.5 thickness graft.  CPT code [phone]  Massive rotator cuff tear supraspinatus into the infraspinatus with large humeral head uncovering with posterior subluxation of the cuff with 5 cm of posterior retraction this was repaired with a speed bridge Arthrex suture anchor construct with cellular graft  He is doing well his wound looks good.  He is having some trouble sleeping.  He would like his medication switched over to meloxicam as the ibuprofen is not working as well.  He is not requiring a lot of pain medication  Recommend switch to meloxicam, okay to shower continue his ice and sling immobilization follow-up in 4 weeks with plan for therapy at 6 weeks  Again that we will start passive range of motion from weeks 7 through 12.

## 2019-05-07 NOTE — Patient Instructions (Signed)
Change to meloxicam  Shower ok  Ice continue  Wear shoulder brace  Fu 4 weeks

## 2019-05-10 ENCOUNTER — Telehealth: Payer: Self-pay | Admitting: Radiology

## 2019-05-10 ENCOUNTER — Other Ambulatory Visit: Payer: Self-pay | Admitting: Orthopedic Surgery

## 2019-05-10 DIAGNOSIS — Z9889 Other specified postprocedural states: Secondary | ICD-10-CM

## 2019-05-10 DIAGNOSIS — M545 Low back pain, unspecified: Secondary | ICD-10-CM

## 2019-05-10 MED ORDER — CYCLOBENZAPRINE HCL 10 MG PO TABS: 10.0000 mg | ORAL_TABLET | Freq: Three times a day (TID) | ORAL | 3 refills | Status: DC

## 2019-05-10 MED FILL — CYCLOBENZAPRINE HCL 10 MG T: 10 | 14 days supply | Qty: 42 | Fill #0

## 2019-05-10 NOTE — Progress Notes (Signed)
Meds ordered this encounter  Medications  . cyclobenzaprine (FLEXERIL) 10 MG tablet    Sig: Take 1 tablet (10 mg total) by mouth 3 (three) times daily.    Dispense:  42 tablet    Refill:  3

## 2019-05-10 NOTE — Telephone Encounter (Signed)
Patient had an old flexeril 10 mg at home, and took it and it really helped.  He is asking for a prescription to be sent in please.

## 2019-06-04 ENCOUNTER — Ambulatory Visit (INDEPENDENT_AMBULATORY_CARE_PROVIDER_SITE_OTHER): Payer: No Typology Code available for payment source | Admitting: Orthopedic Surgery

## 2019-06-04 ENCOUNTER — Encounter: Payer: Self-pay | Admitting: Orthopedic Surgery

## 2019-06-04 ENCOUNTER — Other Ambulatory Visit: Payer: Self-pay

## 2019-06-04 DIAGNOSIS — Z9889 Other specified postprocedural states: Secondary | ICD-10-CM

## 2019-06-04 NOTE — Progress Notes (Signed)
Chief Complaint  Patient presents with  . Follow-up    Recheck on left shoulder, DOS 04-29-19.   Encounter Diagnosis  Name Primary?  . S/P left rotator cuff repair Yes    Week #5 status post superior capsular reconstruction rotator cuff repair with dermal graft using Arthrex  Patient is doing well has minimal discomfort  His suture line looks good he has no swelling.  He is neurovascularly intact  He can start physical therapy and follow-up in a month  We can DC his sling

## 2019-06-08 ENCOUNTER — Other Ambulatory Visit: Payer: Self-pay

## 2019-06-08 ENCOUNTER — Encounter: Payer: Self-pay | Admitting: Physical Therapy

## 2019-06-08 ENCOUNTER — Ambulatory Visit: Payer: No Typology Code available for payment source | Attending: Orthopedic Surgery | Admitting: Physical Therapy

## 2019-06-08 DIAGNOSIS — M25612 Stiffness of left shoulder, not elsewhere classified: Secondary | ICD-10-CM | POA: Insufficient documentation

## 2019-06-08 DIAGNOSIS — M25512 Pain in left shoulder: Secondary | ICD-10-CM | POA: Insufficient documentation

## 2019-06-08 DIAGNOSIS — M6281 Muscle weakness (generalized): Secondary | ICD-10-CM | POA: Diagnosis present

## 2019-06-08 NOTE — Therapy (Signed)
[facility] [place], [place], [address] Phone: [phone]   Fax:  [phone]  Physical Therapy Evaluation  Patient Details  Name: Adam Cunningham. MRN: [id number] Date of Birth: 1960/02/17 Referring Provider (PT): Arther Abbott, MD   Encounter Date: 06/08/2019  PT End of Session - 06/08/19 1421    Visit Number  1    Number of Visits  8    Date for PT Re-Evaluation  07/20/19    Authorization Type  FOTO; Progress note at 10th visit    PT Start Time  0900    PT Stop Time  0943    PT Time Calculation (min)  43 min    Activity Tolerance  Patient tolerated treatment well    Behavior During Therapy  [facility] for tasks assessed/performed       Past Medical History:  Diagnosis Date  . Cancer [facility])    prostate  . Gout     Past Surgical History:  Procedure Laterality Date  . BICEPS TENDON REPAIR    . PROSTATE SURGERY    . SHOULDER OPEN ROTATOR CUFF REPAIR Left 04/29/2019   Procedure: OPEN ROTATOR CUFF REPAIR LEFT SHOULDER;  Surgeon: Carole Civil, MD;  Location: [facility];  Service: Orthopedics;  Laterality: Left;  and interscalene block     There were no vitals filed for this visit.   Subjective Assessment - 06/08/19 1409    Subjective  COVID-19 screening performed upon arrival.Patient arrives to physical therapy with left shoulder pain and difficulties performing ADLs and home activities secondary to a left open RTC repair on 04/29/2019. Patient is able to wean away from sling but use as needed. Patient reports difficulties with some ADLs particularly with showering activities. Patient reports pain at worst as 8/10 and pain at best 0/10 with rest. Patient is compliant with icing. Patient's goals are to decrease pain, improve ROM, improve strength, and return to PLOF.    Pertinent History  L RTC repair 04/29/2019    Limitations  House hold activities;Lifting    Patient Stated Goals  return to PLOF    Currently in  Pain?  No/denies         [facility] PT Assessment - 06/08/19 0001      Assessment   Medical Diagnosis  S/P left rotator cuff repair    Referring Provider (PT)  Arther Abbott, MD    Onset Date/Surgical Date  04/29/19    Hand Dominance  Right    Next MD Visit  07/02/2019    Prior Therapy  no      Precautions   Precautions  Shoulder    Precaution Comments  open left RTC repair      Balance Screen   Has the patient fallen in the past 6 months  No    Has the patient had a decrease in activity level because of a fear of falling?   No    Is the patient reluctant to leave their home because of a fear of falling?   No      Home Environment   Living Environment  Private residence    Living Arrangements  Spouse/significant other      Prior Function   Level of Independence  Independent with basic ADLs      Observation/Other Assessments   Observations  incision well healed      ROM / Strength   AROM / PROM / Strength  PROM      AROM  AROM Assessment Site  --    Right/Left Shoulder  --      PROM   PROM Assessment Site  Shoulder    Right/Left Shoulder  Left    Left Shoulder Flexion  146 Degrees    Left Shoulder Internal Rotation  68 Degrees    Left Shoulder External Rotation  48 Degrees      Palpation   Palpation comment  minimal tenderness to palpation to left shoulder                  Objective measurements completed on examination: See above findings.              PT Education - 06/08/19 1421    Education Details  AAROM shoulder flexion, AAROM press ups, AAROM ER    Person(s) Educated  Patient    Methods  Explanation;Demonstration;Handout    Comprehension  Verbalized understanding;Returned demonstration          PT Long Term Goals - 06/08/19 1756      PT LONG TERM GOAL #1   Title  Patient will be independent with HEP and its progression    Time  6    Period  Weeks    Status  New      PT LONG TERM GOAL #2   Title  Patient will  demonstrate L shoulder flexion AROM to 140+ degrees to improve ability to perform functional tasks.    Time  6    Period  Weeks    Status  New      PT LONG TERM GOAL #3   Title  Patient will demonstrate 60+ degrees of L shoulder ER AROM to improve ability to peform functional tasks.    Time  6    Period  Weeks    Status  New      PT LONG TERM GOAL #4   Title  Patient will demonstrate 4/5 or greater of left shoulder MMT to improve stability during functional tasks.    Time  6    Period  Weeks    Status  New      PT LONG TERM GOAL #5   Title  Patient will report ability to perform ADLs and home activities with left shoulder pain less than or equal to 2/10.    Time  6    Period  Weeks    Status  New             Plan - 06/08/19 1431    Clinical Impression Statement  Patient is a 59 year old right handed male who presents to physical therapy with left shoulder pain and decreased left shoulder PROM secondary to an open left RTC repair on 04/29/2019. Patient's incision is well healed with good mobility. Patient and PT discussed HEP and discussed plan of care. Patient reported understanding. Patient would benefit from skilled physical therapy to address deficits and address patient's goals.    Personal Factors and Comorbidities  Comorbidity 1    Comorbidities  L RTC repair 04/29/2019    Examination-Activity Limitations  Locomotion Level;Reach Overhead;Bathing    Stability/Clinical Decision Making  Stable/Uncomplicated    Clinical Decision Making  Low    Rehab Potential  Good    PT Frequency  Other (comment)   3x per week for 1 week followed by 1x per week for 5 weeks   PT Duration  6 weeks    PT Treatment/Interventions  ADLs/Self Care Home Management;Cryotherapy;Electrical Stimulation;Ultrasound;Iontophoresis 4mg /ml Dexamethasone;Patient/family education;Therapeutic activities;Therapeutic  exercise;Neuromuscular re-education;Manual techniques;Passive range of motion;Vasopneumatic  Device;Taping    PT Next Visit Plan  AAROM to left shoulder, PROM, modalities PRN for pain relief    PT Home Exercise Plan  see patient education    Consulted and Agree with Plan of Care  Patient       Patient will benefit from skilled therapeutic intervention in order to improve the following deficits and impairments:  Decreased range of motion, Pain, Impaired UE functional use, Postural dysfunction, Decreased strength, Decreased activity tolerance  Visit Diagnosis: Acute pain of left shoulder - Plan: PT plan of care cert/re-cert  Stiffness of left shoulder, not elsewhere classified - Plan: PT plan of care cert/re-cert  Muscle weakness (generalized) - Plan: PT plan of care cert/re-cert     Problem List Patient Active Problem List   Diagnosis Date Noted  . Traumatic complete tear of left rotator cuff   . Hyperlipidemia 11/26/2011  . Gout 11/26/2011  . Low testosterone 11/26/2011  . H/O prostate cancer 05/26/2007    Gabriela Eves, PT, DPT 06/08/2019, 6:11 PM  [facility] [address] [place], [place], [address] Phone: [phone]   Fax:  [phone]  Name: Adam Cunningham. MRN: [id number] Date of Birth: 1960-10-12

## 2019-06-09 ENCOUNTER — Other Ambulatory Visit: Payer: Self-pay | Admitting: Orthopedic Surgery

## 2019-06-09 DIAGNOSIS — Z9889 Other specified postprocedural states: Secondary | ICD-10-CM

## 2019-06-09 MED ORDER — MELOXICAM 7.5 MG PO TABS: 7.5000 mg | ORAL_TABLET | Freq: Two times a day (BID) | ORAL | 5 refills | Status: DC

## 2019-06-09 MED FILL — MELOXICAM 7.5 MG TABLET: 7.5 | 30 days supply | Qty: 60 | Fill #0

## 2019-06-09 NOTE — Progress Notes (Signed)
Meds ordered this encounter  Medications  . meloxicam (MOBIC) 7.5 MG tablet    Sig: Take 1 tablet (7.5 mg total) by mouth 2 (two) times daily.    Dispense:  60 tablet    Refill:  5

## 2019-06-10 ENCOUNTER — Ambulatory Visit: Payer: No Typology Code available for payment source | Admitting: Physical Therapy

## 2019-06-10 ENCOUNTER — Other Ambulatory Visit: Payer: Self-pay

## 2019-06-10 ENCOUNTER — Encounter: Payer: Self-pay | Admitting: Physical Therapy

## 2019-06-10 DIAGNOSIS — M25612 Stiffness of left shoulder, not elsewhere classified: Secondary | ICD-10-CM

## 2019-06-10 DIAGNOSIS — M25512 Pain in left shoulder: Secondary | ICD-10-CM

## 2019-06-10 DIAGNOSIS — M6281 Muscle weakness (generalized): Secondary | ICD-10-CM

## 2019-06-10 NOTE — Therapy (Signed)
[facility] [place], [place], [address] Phone: [phone]   Fax:  [phone]  Physical Therapy Treatment  Patient Details  Name: Adam Cunningham. MRN: [id number] Date of Birth: 1960/05/29 Referring Provider (PT): Arther Abbott, MD   Encounter Date: 06/10/2019  PT End of Session - 06/10/19 0940    Visit Number  2    Number of Visits  8    Date for PT Re-Evaluation  07/20/19    Authorization Type  FOTO; Progress note at 10th visit    PT Start Time  0900    PT Stop Time  0950    PT Time Calculation (min)  50 min    Activity Tolerance  Patient tolerated treatment well    Behavior During Therapy  [facility] for tasks assessed/performed       Past Medical History:  Diagnosis Date  . Cancer [facility])    prostate  . Gout     Past Surgical History:  Procedure Laterality Date  . BICEPS TENDON REPAIR    . PROSTATE SURGERY    . SHOULDER OPEN ROTATOR CUFF REPAIR Left 04/29/2019   Procedure: OPEN ROTATOR CUFF REPAIR LEFT SHOULDER;  Surgeon: Carole Civil, MD;  Location: [facility];  Service: Orthopedics;  Laterality: Left;  and interscalene block     There were no vitals filed for this visit.  Subjective Assessment - 06/10/19 0913    Subjective  Covid -19 screen performed upon arrival. Pt reporting no pain at rest. Pt reporting increased pain with stretching and was only able to perform his exercises once yesterday.    Pertinent History  L RTC repair 04/29/2019    Limitations  House hold activities;Lifting    Patient Stated Goals  return to PLOF    Currently in Pain?  No/denies         [facility] PT Assessment - 06/10/19 0001      PROM   Right/Left Shoulder  Left    Left Shoulder Flexion  150 Degrees    Left Shoulder External Rotation  50 Degrees                    [facility] Adult PT Treatment/Exercise - 06/10/19 0001      Exercises   Exercises  Shoulder      Shoulder Exercises: Pulleys   Flexion  2  minutes    Scaption  2 minutes      Shoulder Exercises: Stretch   Table Stretch -Flexion Limitations  15 reps x 5 second holds    Other Shoulder Stretches  table slides scaption x 15 holding 5 seconds      Modalities   Modalities  Vasopneumatic      Vasopneumatic   Number Minutes Vasopneumatic   15 minutes    Vasopnuematic Location   Shoulder    Vasopneumatic Pressure  Low    Vasopneumatic Temperature   34      Manual Therapy   Manual Therapy  Passive ROM    Passive ROM  Left Shoulder, Flexion/ER/Abduction   pain with end ranges            PT Education - 06/10/19 0915    Education Details  added table slides to HEP    Person(s) Educated  Patient    Methods  Explanation;Demonstration    Comprehension  Verbalized understanding;Returned demonstration          PT Long Term Goals - 06/08/19 1756      PT  LONG TERM GOAL #1   Title  Patient will be independent with HEP and its progression    Time  6    Period  Weeks    Status  New      PT LONG TERM GOAL #2   Title  Patient will demonstrate L shoulder flexion AROM to 140+ degrees to improve ability to perform functional tasks.    Time  6    Period  Weeks    Status  New      PT LONG TERM GOAL #3   Title  Patient will demonstrate 60+ degrees of L shoulder ER AROM to improve ability to peform functional tasks.    Time  6    Period  Weeks    Status  New      PT LONG TERM GOAL #4   Title  Patient will demonstrate 4/5 or greater of left shoulder MMT to improve stability during functional tasks.    Time  6    Period  Weeks    Status  New      PT LONG TERM GOAL #5   Title  Patient will report ability to perform ADLs and home activities with left shoulder pain less than or equal to 2/10.    Time  6    Period  Weeks    Status  New            Plan - 06/10/19 0941    Clinical Impression Statement  Pt tolerating exercises and PROM. Pt reporting increased pain with end ranges. Pt making progress with ROM. Pt  reporting next time he will take a over the counter pain med prior to therapy. No goals met this session. Continue skilled PT.    Personal Factors and Comorbidities  Comorbidity 1    Comorbidities  L RTC repair 04/29/2019    Examination-Activity Limitations  Locomotion Level;Reach Overhead;Bathing    Stability/Clinical Decision Making  Stable/Uncomplicated    Clinical Decision Making  Low    Rehab Potential  Good    PT Frequency  Other (comment)    PT Duration  6 weeks    PT Treatment/Interventions  ADLs/Self Care Home Management;Cryotherapy;Electrical Stimulation;Ultrasound;Iontophoresis 56m/ml Dexamethasone;Patient/family education;Therapeutic activities;Therapeutic exercise;Neuromuscular re-education;Manual techniques;Passive range of motion;Vasopneumatic Device;Taping    PT Next Visit Plan  AAROM to left shoulder, PROM, modalities PRN for pain relief    PT Home Exercise Plan  see patient education       Patient will benefit from skilled therapeutic intervention in order to improve the following deficits and impairments:  Decreased range of motion, Pain, Impaired UE functional use, Postural dysfunction, Decreased strength, Decreased activity tolerance  Visit Diagnosis: Acute pain of left shoulder  Stiffness of left shoulder, not elsewhere classified  Muscle weakness (generalized)     Problem List Patient Active Problem List   Diagnosis Date Noted  . Traumatic complete tear of left rotator cuff   . Hyperlipidemia 11/26/2011  . Gout 11/26/2011  . Low testosterone 11/26/2011  . H/O prostate cancer 05/26/2007    JOretha Caprice PT, MPT 06/10/2019, 10:07 AM  [facility] [address]Phone: [phone]  Fax:  [phone] Name: Adam Cunningham MRN: [id number] of Birth: [birth year]

## 2019-06-11 ENCOUNTER — Encounter: Payer: Self-pay | Admitting: Physical Therapy

## 2019-06-11 ENCOUNTER — Other Ambulatory Visit: Payer: Self-pay

## 2019-06-11 ENCOUNTER — Ambulatory Visit: Payer: No Typology Code available for payment source | Admitting: Physical Therapy

## 2019-06-11 DIAGNOSIS — M25512 Pain in left shoulder: Secondary | ICD-10-CM | POA: Diagnosis not present

## 2019-06-11 DIAGNOSIS — M6281 Muscle weakness (generalized): Secondary | ICD-10-CM

## 2019-06-11 DIAGNOSIS — M25612 Stiffness of left shoulder, not elsewhere classified: Secondary | ICD-10-CM

## 2019-06-11 MED FILL — ALLOPURINOL 300 MG TABS: 300 | 90 days supply | Qty: 90 | Fill #1

## 2019-06-11 NOTE — Therapy (Signed)
[facility] [place], [place], [address] Phone: [phone]   Fax:  [phone]  Physical Therapy Treatment  Patient Details  Name: Adam Cunningham. MRN: [id number] Date of Birth: 11-16-1960 Referring Provider (PT): Arther Abbott, MD   Encounter Date: 06/11/2019  PT End of Session - 06/11/19 1116    Visit Number  3    Number of Visits  8    Date for PT Re-Evaluation  07/20/19    Authorization Type  FOTO; Progress note at 10th visit    PT Start Time  0900    PT Stop Time  0950    PT Time Calculation (min)  50 min    Activity Tolerance  Patient tolerated treatment well    Behavior During Therapy  [facility] for tasks assessed/performed       Past Medical History:  Diagnosis Date  . Cancer [facility])    prostate  . Gout     Past Surgical History:  Procedure Laterality Date  . BICEPS TENDON REPAIR    . PROSTATE SURGERY    . SHOULDER OPEN ROTATOR CUFF REPAIR Left 04/29/2019   Procedure: OPEN ROTATOR CUFF REPAIR LEFT SHOULDER;  Surgeon: Carole Civil, MD;  Location: [facility];  Service: Orthopedics;  Laterality: Left;  and interscalene block     There were no vitals filed for this visit.  Subjective Assessment - 06/11/19 1113    Subjective  Covid -19 screen performed upon arrival. Pt reporting no pain at rest but soreness after stretching last night.    Pertinent History  L RTC repair 04/29/2019    Limitations  House hold activities;Lifting    Patient Stated Goals  return to PLOF    Currently in Pain?  No/denies         [facility] PT Assessment - 06/11/19 0001      Assessment   Medical Diagnosis  S/p left rotator cuff repair    Referring Provider (PT)  Arther Abbott, MD    Onset Date/Surgical Date  04/29/19      PROM   Right/Left Shoulder  Left    Left Shoulder Flexion  155 Degrees    Left Shoulder External Rotation  70 Degrees                    [facility] Adult PT Treatment/Exercise - 06/11/19  0001      Exercises   Exercises  Shoulder      Shoulder Exercises: Supine   External Rotation  AAROM;Both;15 reps    External Rotation Limitations  cane    Flexion  AAROM;15 reps    Flexion Limitations  cane      Shoulder Exercises: Standing   Other Standing Exercises  wall slides x15 reps, wall ladder x 5 reps      Shoulder Exercises: Pulleys   Flexion  3 minutes    Scaption  3 minutes      Shoulder Exercises: Stretch   Table Stretch -Flexion Limitations  15 reps x 5 second holds    Other Shoulder Stretches  table slides scaption x 15 holding 5 seconds      Modalities   Modalities  Vasopneumatic      Vasopneumatic   Number Minutes Vasopneumatic   15 minutes    Vasopnuematic Location   Shoulder    Vasopneumatic Pressure  Low    Vasopneumatic Temperature   34      Manual Therapy   Manual Therapy  Passive ROM  Passive ROM  Left Shoulder, Flexion/ER/Abduction   pain with end ranges                 PT Long Term Goals - 06/08/19 1756      PT LONG TERM GOAL #1   Title  Patient will be independent with HEP and its progression    Time  6    Period  Weeks    Status  New      PT LONG TERM GOAL #2   Title  Patient will demonstrate L shoulder flexion AROM to 140+ degrees to improve ability to perform functional tasks.    Time  6    Period  Weeks    Status  New      PT LONG TERM GOAL #3   Title  Patient will demonstrate 60+ degrees of L shoulder ER AROM to improve ability to peform functional tasks.    Time  6    Period  Weeks    Status  New      PT LONG TERM GOAL #4   Title  Patient will demonstrate 4/5 or greater of left shoulder MMT to improve stability during functional tasks.    Time  6    Period  Weeks    Status  New      PT LONG TERM GOAL #5   Title  Patient will report ability to perform ADLs and home activities with left shoulder pain less than or equal to 2/10.    Time  6    Period  Weeks    Status  New            Plan - 06/11/19  1222    Clinical Impression Statement  Pt tolerating exercises well with improvement in PROM. Flexion: 155 and ER 70 degrees which is an improvement since last visit.  Continue skilled PT and progress toward pt's LTG's.    Personal Factors and Comorbidities  Comorbidity 1    Comorbidities  L RTC repair 04/29/2019    Examination-Activity Limitations  Locomotion Level;Reach Overhead;Bathing    Stability/Clinical Decision Making  Stable/Uncomplicated    Rehab Potential  Good    PT Frequency  Other (comment)    PT Duration  6 weeks    PT Treatment/Interventions  ADLs/Self Care Home Management;Cryotherapy;Electrical Stimulation;Ultrasound;Iontophoresis 4mg /ml Dexamethasone;Patient/family education;Therapeutic activities;Therapeutic exercise;Neuromuscular re-education;Manual techniques;Passive range of motion;Vasopneumatic Device;Taping    PT Next Visit Plan  AAROM to left shoulder, PROM, modalities PRN for pain relief    PT Home Exercise Plan  see patient education    Consulted and Agree with Plan of Care  Patient       Patient will benefit from skilled therapeutic intervention in order to improve the following deficits and impairments:  Decreased range of motion, Pain, Impaired UE functional use, Postural dysfunction, Decreased strength, Decreased activity tolerance  Visit Diagnosis: Acute pain of left shoulder  Stiffness of left shoulder, not elsewhere classified  Muscle weakness (generalized)     Problem List Patient Active Problem List   Diagnosis Date Noted  . Traumatic complete tear of left rotator cuff   . Hyperlipidemia 11/26/2011  . Gout 11/26/2011  . Low testosterone 11/26/2011  . H/O prostate cancer 05/26/2007    Oretha Caprice, PT, MPT 06/11/2019, 12:26 PM  [facility] [place], [place], [address] Phone: [phone]   Fax:  [phone]  Name: Adam Cunningham. MRN: [id number] Date of Birth:  Mar 04, 1960

## 2019-06-16 ENCOUNTER — Other Ambulatory Visit: Payer: Self-pay

## 2019-06-16 ENCOUNTER — Ambulatory Visit: Payer: No Typology Code available for payment source | Admitting: Physical Therapy

## 2019-06-16 DIAGNOSIS — M25512 Pain in left shoulder: Secondary | ICD-10-CM | POA: Diagnosis not present

## 2019-06-16 DIAGNOSIS — M6281 Muscle weakness (generalized): Secondary | ICD-10-CM

## 2019-06-16 DIAGNOSIS — M25612 Stiffness of left shoulder, not elsewhere classified: Secondary | ICD-10-CM

## 2019-06-16 NOTE — Therapy (Signed)
[facility] [place], [place], [address] Phone: [phone]   Fax:  [phone]  Physical Therapy Treatment  Patient Details  Name: Adam Cunningham. MRN: [id number] Date of Birth: [birth year] Referring Provider (PT): Arther Abbott, MD   Encounter Date: 06/16/2019  PT End of Session - 06/16/19 0943    Visit Number  4    Number of Visits  8    Date for PT Re-Evaluation  07/20/19    Authorization Type  FOTO; Progress note at 10th visit    PT Start Time  0901    PT Stop Time  0957    PT Time Calculation (min)  56 min    Activity Tolerance  Patient tolerated treatment well    Behavior During Therapy  [facility] for tasks assessed/performed       Past Medical History:  Diagnosis Date  . Cancer [facility])    prostate  . Gout     Past Surgical History:  Procedure Laterality Date  . BICEPS TENDON REPAIR    . PROSTATE SURGERY    . SHOULDER OPEN ROTATOR CUFF REPAIR Left 04/29/2019   Procedure: OPEN ROTATOR CUFF REPAIR LEFT SHOULDER;  Surgeon: Carole Civil, MD;  Location: [facility];  Service: Orthopedics;  Laterality: Left;  and interscalene block     There were no vitals filed for this visit.  Subjective Assessment - 06/16/19 0904    Subjective  Covid -19 screen performed upon arrival. Patient arrived with pain in shoulder.    Pertinent History  L RTC repair 04/29/2019    Limitations  House hold activities;Lifting    Patient Stated Goals  return to PLOF    Currently in Pain?  Yes    Pain Score  6     Pain Location  Shoulder    Pain Orientation  Left    Pain Descriptors / Indicators  Discomfort    Pain Type  Surgical pain    Pain Onset  More than a month ago    Pain Frequency  Intermittent    Aggravating Factors   pressure on it when sleeping    Pain Relieving Factors  at rest, meds         [facility] PT Assessment - 06/16/19 0001      PROM   Right/Left Shoulder  Left    Left Shoulder Flexion  155 Degrees    Left  Shoulder External Rotation  70 Degrees                    [facility] Adult PT Treatment/Exercise - 06/16/19 0001      Shoulder Exercises: Supine   External Rotation  AAROM;Both;15 reps    External Rotation Limitations  cane    Flexion  AAROM;15 reps    Flexion Limitations  cane      Shoulder Exercises: Standing   Other Standing Exercises  wall slides 2x10      Shoulder Exercises: Pulleys   Flexion  5 minutes      Shoulder Exercises: Isometric Strengthening   Flexion  5X5"    Extension  5X5"    External Rotation  5X5"    Internal Rotation  5X5"      Vasopneumatic   Number Minutes Vasopneumatic   15 minutes    Vasopnuematic Location   Shoulder    Vasopneumatic Pressure  Low    Vasopneumatic Temperature   34      Manual Therapy   Manual Therapy  Passive  ROM    Manual therapy comments  rhythmic stabs for all motions    Passive ROM  manual PROM for left shoulder ER, flexion, abd with holds end range to improve and maintain mobility                  PT Long Term Goals - 06/16/19 0924      PT LONG TERM GOAL #1   Title  Patient will be independent with HEP and its progression    Time  6    Period  Weeks    Status  On-going      PT LONG TERM GOAL #2   Title  Patient will demonstrate L shoulder flexion AROM to 140+ degrees to improve ability to perform functional tasks.    Time  6    Period  Weeks    Status  On-going   no lifting arm active at this time 06/16/19     PT LONG TERM GOAL #3   Title  Patient will demonstrate 60+ degrees of L shoulder ER AROM to improve ability to peform functional tasks.    Time  6    Period  Weeks    Status  On-going   not lifting UE active at this time     PT LONG TERM GOAL #4   Title  Patient will demonstrate 4/5 or greater of left shoulder MMT to improve stability during functional tasks.    Time  6    Period  Weeks    Status  On-going   NT 06/16/19     PT LONG TERM GOAL #5   Title  Patient will report ability  to perform ADLs and home activities with left shoulder pain less than or equal to 2/10.    Time  6    Period  Weeks    Status  On-going            Plan - 06/16/19 [id number]    Clinical Impression Statement  Patient tolerated treatment well today. Patient reported some pain today. Patient is doing HEP daily with good technique. Patient ROM is WNL currently. Today prgressed with AAROM exercise and isometrics with no difficulty. Goals progressing at this time due to healing/protocol.    Personal Factors and Comorbidities  Comorbidity 1    Comorbidities  L RTC repair 04/29/2019    Examination-Activity Limitations  Locomotion Level;Reach Overhead;Bathing    Stability/Clinical Decision Making  Stable/Uncomplicated    Rehab Potential  Good    PT Frequency  Other (comment)    PT Duration  6 weeks    PT Treatment/Interventions  ADLs/Self Care Home Management;Cryotherapy;Electrical Stimulation;Ultrasound;Iontophoresis 4mg /ml Dexamethasone;Patient/family education;Therapeutic activities;Therapeutic exercise;Neuromuscular re-education;Manual techniques;Passive range of motion;Vasopneumatic Device;Taping    PT Next Visit Plan  cont with AAROM to left shoulder, PROM, modalities PRN for pain relief (7 weeks 06/17/19)    Consulted and Agree with Plan of Care  Patient       Patient will benefit from skilled therapeutic intervention in order to improve the following deficits and impairments:  Decreased range of motion, Pain, Impaired UE functional use, Postural dysfunction, Decreased strength, Decreased activity tolerance  Visit Diagnosis: Acute pain of left shoulder  Stiffness of left shoulder, not elsewhere classified  Muscle weakness (generalized)     Problem List Patient Active Problem List   Diagnosis Date Noted  . Traumatic complete tear of left rotator cuff   . Hyperlipidemia 11/26/2011  . Gout 11/26/2011  . Low testosterone 11/26/2011  . H/O prostate  cancer 05/26/2007    Phillips Climes, PTA 06/16/2019, 10:32 AM  [facility] [place], [place], [address] Phone: [phone]   Fax:  [phone]  Name: Adam [facility]. MRN: [id number] Date of Birth: [birth year]

## 2019-06-23 ENCOUNTER — Other Ambulatory Visit: Payer: Self-pay

## 2019-06-23 ENCOUNTER — Ambulatory Visit: Payer: No Typology Code available for payment source | Attending: Orthopedic Surgery | Admitting: Physical Therapy

## 2019-06-23 ENCOUNTER — Encounter: Payer: Self-pay | Admitting: Physical Therapy

## 2019-06-23 DIAGNOSIS — M6281 Muscle weakness (generalized): Secondary | ICD-10-CM | POA: Insufficient documentation

## 2019-06-23 DIAGNOSIS — M25612 Stiffness of left shoulder, not elsewhere classified: Secondary | ICD-10-CM | POA: Diagnosis present

## 2019-06-23 DIAGNOSIS — M25512 Pain in left shoulder: Secondary | ICD-10-CM | POA: Insufficient documentation

## 2019-06-23 NOTE — Therapy (Signed)
[facility] [place], [place], [address] Phone: [phone]   Fax:  [phone]  Physical Therapy Treatment  Patient Details  Name: Adam [facility]. MRN: [id number] Date of Birth: 1960/12/06 Referring Provider (PT): Arther Abbott, MD   Encounter Date: 06/23/2019  PT End of Session - 06/23/19 0904    Visit Number  5    Number of Visits  8    Date for PT Re-Evaluation  07/20/19    Authorization Type  FOTO; Progress note at 10th visit    PT Start Time  0900    PT Stop Time  0953    PT Time Calculation (min)  53 min    Activity Tolerance  Patient tolerated treatment well    Behavior During Therapy  [facility] for tasks assessed/performed       Past Medical History:  Diagnosis Date  . Cancer [facility])    prostate  . Gout     Past Surgical History:  Procedure Laterality Date  . BICEPS TENDON REPAIR    . PROSTATE SURGERY    . SHOULDER OPEN ROTATOR CUFF REPAIR Left 04/29/2019   Procedure: OPEN ROTATOR CUFF REPAIR LEFT SHOULDER;  Surgeon: Carole Civil, MD;  Location: [facility];  Service: Orthopedics;  Laterality: Left;  and interscalene block     There were no vitals filed for this visit.  Subjective Assessment - 06/23/19 0902    Subjective  Covid -19 screen performed upon arrival. 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    Pertinent History  L RTC repair 04/29/2019    Limitations  House hold activities;Lifting    Patient Stated Goals  return to PLOF    Currently in Pain?  Yes    Pain Score  5     Pain Location  Shoulder    Pain Orientation  Left    Pain Descriptors / Indicators  Sore    Pain Type  Surgical pain    Pain Onset  More than a month ago    Pain Frequency  Intermittent         [facility] PT Assessment - 06/23/19 0001      Assessment   Medical Diagnosis  S/p left rotator cuff repair    Referring Provider (PT)  Arther Abbott, MD    Onset Date/Surgical Date  04/29/19    Next MD Visit  07/02/2019                    [facility] Adult PT Treatment/Exercise - 06/23/19 0001      Exercises   Exercises  Shoulder      Shoulder Exercises: Pulleys   Flexion  3 minutes      Shoulder Exercises: Therapy Ball   Flexion  Left;20 reps    Scaption  Left;10 reps      Shoulder Exercises: ROM/Strengthening   UBE (Upper Arm Bike)  120 RPM x8 mins (4 fwd, 4 bwd)    Ranger  standing ranger flexion, CW, CCW x2 mins each      Modalities   Modalities  Vasopneumatic      Vasopneumatic   Number Minutes Vasopneumatic   15 minutes    Vasopnuematic Location   Shoulder    Vasopneumatic Pressure  Low    Vasopneumatic Temperature   34  PT Long Term Goals - 06/16/19 [id number]      PT LONG TERM GOAL #1   Title  Patient will be independent with HEP and its progression    Time  6    Period  Weeks    Status  On-going      PT LONG TERM GOAL #2   Title  Patient will demonstrate L shoulder flexion AROM to 140+ degrees to improve ability to perform functional tasks.    Time  6    Period  Weeks    Status  On-going   no lifting arm active at this time 06/16/19     PT LONG TERM GOAL #3   Title  Patient will demonstrate 60+ degrees of L shoulder ER AROM to improve ability to peform functional tasks.    Time  6    Period  Weeks    Status  On-going   not lifting UE active at this time     PT LONG TERM GOAL #4   Title  Patient will demonstrate 4/5 or greater of left shoulder MMT to improve stability during functional tasks.    Time  6    Period  Weeks    Status  On-going   NT 06/16/19     PT LONG TERM GOAL #5   Title  Patient will report ability to perform ADLs and home activities with left shoulder pain less than or equal to 2/10.    Time  6    Period  Weeks    Status  On-going            Plan - 06/23/19 0944    Clinical Impression Statement  Patient responded well to therapy  session with the addition of new TEs. Patient denied any significant pain with TEs. Tactile cuing provided to prevent trunk rotation during AAROM flexion. Patient instructed to begin HEP without PVC to perform AROM and build strengthening through available range. Patient reported understanding.    Personal Factors and Comorbidities  Comorbidity 1    Comorbidities  L RTC repair 04/29/2019    Examination-Activity Limitations  Locomotion Level;Reach Overhead;Bathing    Stability/Clinical Decision Making  Stable/Uncomplicated    Clinical Decision Making  Low    Rehab Potential  Good    PT Frequency  Other (comment)    PT Duration  6 weeks    PT Treatment/Interventions  ADLs/Self Care Home Management;Cryotherapy;Electrical Stimulation;Ultrasound;Iontophoresis 4mg /ml Dexamethasone;Patient/family education;Therapeutic activities;Therapeutic exercise;Neuromuscular re-education;Manual techniques;Passive range of motion;Vasopneumatic Device;Taping    PT Next Visit Plan  FOTO; MD note to Dr. Adron Bene; slow progression of strengthening; AROM exercises to left shoulder, PROM to maintain ROM, modalities PRN for pain relief (8 weeks 06/24/19)    PT Home Exercise Plan  see patient education    Consulted and Agree with Plan of Care  Patient       Patient will benefit from skilled therapeutic intervention in order to improve the following deficits and impairments:  Decreased range of motion, Pain, Impaired UE functional use, Postural dysfunction, Decreased strength, Decreased activity tolerance  Visit Diagnosis: Acute pain of left shoulder  Stiffness of left shoulder, not elsewhere classified  Muscle weakness (generalized)     Problem List Patient Active Problem List   Diagnosis Date Noted  . Traumatic complete tear of left rotator cuff   . Hyperlipidemia 11/26/2011  . Gout 11/26/2011  . Low testosterone 11/26/2011  . H/O prostate cancer 05/26/2007    Gabriela Eves, PT, DPT 06/23/2019, 9:54  AM  [facility] [place], [place], [address] Phone: [phone]   Fax:  [phone]  Name: Gracen Slott. MRN: [id number] Date of Birth: [birth year]

## 2019-06-29 ENCOUNTER — Encounter: Payer: Self-pay | Admitting: Family Medicine

## 2019-06-30 ENCOUNTER — Encounter: Payer: Self-pay | Admitting: Physical Therapy

## 2019-06-30 ENCOUNTER — Other Ambulatory Visit: Payer: Self-pay

## 2019-06-30 ENCOUNTER — Ambulatory Visit: Payer: No Typology Code available for payment source | Admitting: Physical Therapy

## 2019-06-30 DIAGNOSIS — M25512 Pain in left shoulder: Secondary | ICD-10-CM

## 2019-06-30 DIAGNOSIS — M6281 Muscle weakness (generalized): Secondary | ICD-10-CM

## 2019-06-30 DIAGNOSIS — M25612 Stiffness of left shoulder, not elsewhere classified: Secondary | ICD-10-CM

## 2019-06-30 NOTE — Therapy (Signed)
[facility] [place], [place], [address] Phone: [phone]   Fax:  [phone]  Physical Therapy Treatment  Patient Details  Name: [place] [facility]. MRN: [id number] Date of Birth: 21-Oct-1960 Referring Provider (PT): Arther Abbott, MD   Encounter Date: 06/30/2019  PT End of Session - 06/30/19 0909    Visit Number  6    Number of Visits  8    Date for PT Re-Evaluation  07/20/19    Authorization Type  FOTO; Progress note at 10th visit    PT Start Time  0900    PT Stop Time  0955    PT Time Calculation (min)  55 min    Activity Tolerance  Patient tolerated treatment well    Behavior During Therapy  [facility] for tasks assessed/performed       Past Medical History:  Diagnosis Date  . Cancer [facility])    prostate  . Gout     Past Surgical History:  Procedure Laterality Date  . BICEPS TENDON REPAIR    . PROSTATE SURGERY    . SHOULDER OPEN ROTATOR CUFF REPAIR Left 04/29/2019   Procedure: OPEN ROTATOR CUFF REPAIR LEFT SHOULDER;  Surgeon: Carole Civil, MD;  Location: [facility];  Service: Orthopedics;  Laterality: Left;  and interscalene block     There were no vitals filed for this visit.  Subjective Assessment - 06/30/19 0908    Subjective  Covid -19 screen performed upon arrival. Patient arrives doing well. No pain in the shoulder at this time.    Pertinent History  L RTC repair 04/29/2019    Limitations  House hold activities;Lifting    Patient Stated Goals  return to PLOF    Currently in Pain?  No/denies    Pain Onset  More than a month ago         [facility] PT Assessment - 06/30/19 0001      Assessment   Medical Diagnosis  S/p left rotator cuff repair    Referring Provider (PT)  Arther Abbott, MD    Onset Date/Surgical Date  04/29/19    Next MD Visit  07/02/2019      Precautions   Precaution Comments  open left RTC repair; 9 weeks post op 07/02/2019      AROM   Left Shoulder Flexion  165 Degrees    Left  Shoulder Internal Rotation  --   T7   Left Shoulder External Rotation  55 Degrees   T2                   [facility] Adult PT Treatment/Exercise - 06/30/19 0001      Exercises   Exercises  Shoulder      Shoulder Exercises: Standing   Protraction  Strengthening;20 reps;Theraband;Left    Theraband Level (Shoulder Protraction)  Level 1 (Yellow)    External Rotation  Strengthening;Left;20 reps    Theraband Level (Shoulder External Rotation)  Level 1 (Yellow)    Internal Rotation  Strengthening;Left;20 reps;Theraband    Theraband Level (Shoulder Internal Rotation)  Level 1 (Yellow)    Extension  Strengthening;Both;20 reps;Theraband    Theraband Level (Shoulder Extension)  Level 1 (Yellow)    Row  Strengthening;Left;20 reps;Theraband    Other Standing Exercises  wall slides, scaption x5      Shoulder Exercises: ROM/Strengthening   UBE (Upper Arm Bike)  90 RPM x8 mins (4 fwd, 4 bwd)    Ranger  standing ranger flexion x3 mins  Wall Pushups  20 reps   2x10   Ball on Wall  2# 2.5 mins CW/CCW      Modalities   Modalities  Vasopneumatic      Vasopneumatic   Number Minutes Vasopneumatic   15 minutes    Vasopnuematic Location   Shoulder    Vasopneumatic Pressure  Low    Vasopneumatic Temperature   34                  PT Long Term Goals - 06/16/19 0924      PT LONG TERM GOAL #1   Title  Patient will be independent with HEP and its progression    Time  6    Period  Weeks    Status  On-going      PT LONG TERM GOAL #2   Title  Patient will demonstrate L shoulder flexion AROM to 140+ degrees to improve ability to perform functional tasks.    Time  6    Period  Weeks    Status  On-going   no lifting arm active at this time 06/16/19     PT LONG TERM GOAL #3   Title  Patient will demonstrate 60+ degrees of L shoulder ER AROM to improve ability to peform functional tasks.    Time  6    Period  Weeks    Status  On-going   not lifting UE active at this time      PT LONG TERM GOAL #4   Title  Patient will demonstrate 4/5 or greater of left shoulder MMT to improve stability during functional tasks.    Time  6    Period  Weeks    Status  On-going   NT 06/16/19     PT LONG TERM GOAL #5   Title  Patient will report ability to perform ADLs and home activities with left shoulder pain less than or equal to 2/10.    Time  6    Period  Weeks    Status  On-going            Plan - 06/30/19 0957    Clinical Impression Statement  Patient responded well to the progression of TEs. Patient guided through RW4 exercises with verbal and tactile cuing to prevent trunk rotation and UT compensation. Patient responding well to AROM exercise but still wth discomfort with the eccentric control to neutral. Goals ongoing at this time but making improvements. No adverse affects upon removal of modalities.    Personal Factors and Comorbidities  Comorbidity 1    Comorbidities  L RTC repair 04/29/2019    Examination-Activity Limitations  Locomotion Level;Reach Overhead;Bathing    Stability/Clinical Decision Making  Stable/Uncomplicated    Clinical Decision Making  Low    Rehab Potential  Good    PT Frequency  Other (comment)    PT Duration  6 weeks    PT Treatment/Interventions  ADLs/Self Care Home Management;Cryotherapy;Electrical Stimulation;Ultrasound;Iontophoresis 4mg /ml Dexamethasone;Patient/family education;Therapeutic activities;Therapeutic exercise;Neuromuscular re-education;Manual techniques;Passive range of motion;Vasopneumatic Device;Taping    PT Next Visit Plan  slow progression of strengthening; AROM exercises to left shoulder, PROM to maintain ROM, modalities PRN for pain relief (9 weeks 07/01/19)    PT Home Exercise Plan  see patient education    Consulted and Agree with Plan of Care  Patient       Patient will benefit from skilled therapeutic intervention in order to improve the following deficits and impairments:  Decreased range of motion, Pain,  Impaired  UE functional use, Postural dysfunction, Decreased strength, Decreased activity tolerance  Visit Diagnosis: Acute pain of left shoulder  Stiffness of left shoulder, not elsewhere classified  Muscle weakness (generalized)     Problem List Patient Active Problem List   Diagnosis Date Noted  . Traumatic complete tear of left rotator cuff   . Hyperlipidemia 11/26/2011  . Gout 11/26/2011  . Low testosterone 11/26/2011  . H/O prostate cancer 05/26/2007    Gabriela Eves, PT, DPT 06/30/2019, 1:23 PM  [facility] [address] [place], [place], [address] Phone: [phone]   Fax:  [phone]  Name: [place] [facility]. MRN: [id number] Date of Birth: 12-24-1960

## 2019-07-02 ENCOUNTER — Ambulatory Visit (INDEPENDENT_AMBULATORY_CARE_PROVIDER_SITE_OTHER): Payer: No Typology Code available for payment source | Admitting: Orthopedic Surgery

## 2019-07-02 ENCOUNTER — Other Ambulatory Visit: Payer: Self-pay

## 2019-07-02 DIAGNOSIS — Z9889 Other specified postprocedural states: Secondary | ICD-10-CM

## 2019-07-02 NOTE — Progress Notes (Signed)
Chief Complaint  Patient presents with  . Shoulder Pain    L/ doing wonderful   Surgery date was April 8 Status post open cuff repair left shoulder with dermal graft augmentation  He is doing remarkably well his pain is controlled with meloxicam twice a day and Tylenol.  He goes to therapy once a week does most of his own therapy  He has full forward elevation full internal rotation near full external rotation improving strength  Procedure: Open rotator cuff repair left shoulder with dermal acellular graft from [employer] the 1.5 thickness graft. CPT code [id number]    Recommend 2 more therapy visits then home therapy  Follow-up 1 month

## 2019-07-07 ENCOUNTER — Other Ambulatory Visit: Payer: Self-pay

## 2019-07-07 ENCOUNTER — Encounter: Payer: Self-pay | Admitting: Physical Therapy

## 2019-07-07 ENCOUNTER — Ambulatory Visit: Payer: No Typology Code available for payment source | Admitting: Physical Therapy

## 2019-07-07 DIAGNOSIS — M25612 Stiffness of left shoulder, not elsewhere classified: Secondary | ICD-10-CM

## 2019-07-07 DIAGNOSIS — M6281 Muscle weakness (generalized): Secondary | ICD-10-CM

## 2019-07-07 DIAGNOSIS — M25512 Pain in left shoulder: Secondary | ICD-10-CM | POA: Diagnosis not present

## 2019-07-07 NOTE — Therapy (Addendum)
[facility] [place], [place], [address] Phone: [phone]   Fax:  [phone]  Physical Therapy Treatment PHYSICAL THERAPY DISCHARGE SUMMARY  Visits from Start of Care: 7  Current functional level related to goals / functional outcomes: See below   Remaining deficits: See goals   Education / Equipment: HEP Plan: Patient agrees to discharge.  Patient goals were met. Patient is being discharged due to meeting the stated rehab goals.  ?????     Patient request as well. Gabriela Eves, PT, DPT  Patient Details  Name: Adam Cunningham. MRN: [id number] Date of Birth: 05-02-1960 Referring Provider (PT): Arther Abbott, MD   Encounter Date: 07/07/2019   PT End of Session - 07/07/19 0916    Visit Number 7    Number of Visits 8    Date for PT Re-Evaluation 07/20/19    Authorization Type FOTO; Progress note at 10th visit    PT Start Time 0904    PT Stop Time 0954    PT Time Calculation (min) 50 min    Activity Tolerance Patient tolerated treatment well    Behavior During Therapy [facility] for tasks assessed/performed           Past Medical History:  Diagnosis Date  . Cancer [facility])    prostate  . Gout     Past Surgical History:  Procedure Laterality Date  . BICEPS TENDON REPAIR    . PROSTATE SURGERY    . SHOULDER OPEN ROTATOR CUFF REPAIR Left 04/29/2019   Procedure: OPEN ROTATOR CUFF REPAIR LEFT SHOULDER;  Surgeon: Carole Civil, MD;  Location: [facility];  Service: Orthopedics;  Laterality: Left;  and interscalene block     There were no vitals filed for this visit.   Subjective Assessment - 07/07/19 0914    Subjective Covid -19 screen performed upon arrival. States that MD was very pleased with patient's ROM. Patient wishes to do HEP at home due to copays.    Pertinent History L RTC repair 04/29/2019    Limitations House hold activities;Lifting    Patient Stated Goals return to PLOF    Currently in  Pain? No/denies              [facility] PT Assessment - 07/07/19 0001      Assessment   Medical Diagnosis S/p left rotator cuff repair    Referring Provider (PT) Arther Abbott, MD    Onset Date/Surgical Date 04/29/19    Next MD Visit 07/30/2019      Precautions   Precaution Comments open left RTC repair; 9 weeks post op 07/02/2019      Observation/Other Assessments   Focus on Therapeutic Outcomes (FOTO)  30%, CJ      ROM / Strength   AROM / PROM / Strength AROM;Strength      AROM   Overall AROM  Within functional limits for tasks performed    AROM Assessment Site Shoulder    Right/Left Shoulder Left    Left Shoulder Flexion 140 Degrees   in standing   Left Shoulder Internal Rotation 70 Degrees    Left Shoulder External Rotation 75 Degrees      Strength   Overall Strength Within functional limits for tasks performed    Strength Assessment Site Shoulder    Right/Left Shoulder Left    Left Shoulder Flexion 4-/5    Left Shoulder Internal Rotation 4/5    Left Shoulder External Rotation 4/5  [facility] PT Treatment/Exercise - 07/07/19 0001      Shoulder Exercises: Supine   Other Supine Exercises L shoulder ABCs x1 rep      Shoulder Exercises: Standing   Protraction Strengthening;Left;20 reps;Theraband    Theraband Level (Shoulder Protraction) Level 2 (Red);Level 3 (Green)    External Rotation Strengthening;Left;20 reps;Theraband    Theraband Level (Shoulder External Rotation) Level 2 (Red)    Internal Rotation Strengthening;Left;20 reps;Theraband    Theraband Level (Shoulder Internal Rotation) Level 2 (Red)    Row Strengthening;Left;20 reps;Theraband    Theraband Level (Shoulder Row) Level 2 (Red);Level 3 (Green)      Shoulder Exercises: ROM/Strengthening   UBE (Upper Arm Bike) 60 RPM x8 min    Wall Pushups 20 reps    Ball on Wall 4# wall clock x3      Modalities   Modalities Vasopneumatic      Vasopneumatic   Number Minutes  Vasopneumatic  15 minutes    Vasopnuematic Location  Shoulder    Vasopneumatic Pressure Low    Vasopneumatic Temperature  34                  PT Education - 07/07/19 0947    Education Details HEP- RW4 with red and green theraband, wallp pushups    Person(s) Educated Patient    Methods Explanation;Handout    Comprehension Verbalized understanding               PT Long Term Goals - 07/07/19 0955      PT LONG TERM GOAL #1   Title Patient will be independent with HEP and its progression    Time 6    Period Weeks    Status Achieved      PT LONG TERM GOAL #2   Title Patient will demonstrate L shoulder flexion AROM to 140+ degrees to improve ability to perform functional tasks.    Time 6    Period Weeks    Status Achieved   no lifting arm active at this time 06/16/19     PT LONG TERM GOAL #3   Title Patient will demonstrate 60+ degrees of L shoulder ER AROM to improve ability to peform functional tasks.    Time 6    Period Weeks    Status Achieved   not lifting UE active at this time     PT LONG TERM GOAL #4   Title Patient will demonstrate 4/5 or greater of left shoulder MMT to improve stability during functional tasks.    Time 6    Period Weeks    Status Partially Met   NT 06/16/19     PT LONG TERM GOAL #5   Title Patient will report ability to perform ADLs and home activities with left shoulder pain less than or equal to 2/10.    Time 6    Period Weeks    Status Achieved                 Plan - 07/07/19 1035    Clinical Impression Statement Patient presented in clinic with reports of weakness following L shoulder RCR but is compliant with HEP. Patient wishes to be placed on hold secondary to high copay. Great L shoulder ROM noted throughout treatment although L shoulder strength is deficient at this time. Patient provided new HEP to continue strengthening at home with education regarding technique, progression, parameters. Normal vasopnuematic response  noted following removal of the modality. Patient able to achieve all LTGs except  MMT currently.    Personal Factors and Comorbidities Comorbidity 1    Comorbidities L RTC repair 04/29/2019    Examination-Activity Limitations Locomotion Level;Reach Overhead;Bathing    Stability/Clinical Decision Making Stable/Uncomplicated    Rehab Potential Good    PT Frequency Other (comment)    PT Duration 6 weeks    PT Treatment/Interventions ADLs/Self Care Home Management;Cryotherapy;Electrical Stimulation;Ultrasound;Iontophoresis 44m/ml Dexamethasone;Patient/family education;Therapeutic activities;Therapeutic exercise;Neuromuscular re-education;Manual techniques;Passive range of motion;Vasopneumatic Device;Taping    PT Next Visit Plan Place on hold currently.    PT Home Exercise Plan see patient education    Consulted and Agree with Plan of Care Patient           Patient will benefit from skilled therapeutic intervention in order to improve the following deficits and impairments:  Decreased range of motion, Pain, Impaired UE functional use, Postural dysfunction, Decreased strength, Decreased activity tolerance  Visit Diagnosis: Acute pain of left shoulder  Stiffness of left shoulder, not elsewhere classified  Muscle weakness (generalized)     Problem List Patient Active Problem List   Diagnosis Date Noted  . Traumatic complete tear of left rotator cuff   . Hyperlipidemia 11/26/2011  . Gout 11/26/2011  . Low testosterone 11/26/2011  . H/O prostate cancer 05/26/2007    KStandley Brooking PTA 07/07/19 10:39 AM   [facility] [address] [address]Phone: [phone]  Fax:  [phone] Name: Adam Cunningham MRN: [id number] of Birth: 7February 26, 1962

## 2019-07-10 ENCOUNTER — Encounter: Payer: Self-pay | Admitting: Family Medicine

## 2019-07-11 MED FILL — MELOXICAM 7.5 MG TABLET: 7.5 | 30 days supply | Qty: 60 | Fill #1

## 2019-07-30 ENCOUNTER — Ambulatory Visit: Payer: No Typology Code available for payment source | Admitting: Orthopedic Surgery

## 2019-07-30 NOTE — Progress Notes (Deleted)
Chief Complaint  Patient presents with  . Shoulder Pain    L/ doing wonderful   Surgery date was April 8 Status post open cuff repair left shoulder with dermal graft augmentation  He is doing remarkably well his pain is controlled with meloxicam twice a day and Tylenol.  He goes to therapy once a week does most of his own therapy  He has full forward elevation full internal rotation near full external rotation improving strength  Procedure: Open rotator cuff repair left shoulder with dermal acellular graft from [employer] the 1.5 thickness graft. CPT code [phone]    Recommend 2 more therapy visits then home therapy  Follow-up 1 month

## 2019-08-16 ENCOUNTER — Encounter: Payer: No Typology Code available for payment source | Attending: Family Medicine | Admitting: Nutrition

## 2019-08-16 ENCOUNTER — Other Ambulatory Visit: Payer: Self-pay

## 2019-08-16 ENCOUNTER — Encounter: Payer: Self-pay | Admitting: Nutrition

## 2019-08-16 VITALS — Ht 74.0 in | Wt 253.0 lb

## 2019-08-16 DIAGNOSIS — E782 Mixed hyperlipidemia: Secondary | ICD-10-CM | POA: Diagnosis present

## 2019-08-16 DIAGNOSIS — E669 Obesity, unspecified: Secondary | ICD-10-CM | POA: Diagnosis present

## 2019-08-16 NOTE — Progress Notes (Signed)
First visit [facility]  Medical Nutrition Therapy:  Appt start time: 1100 end time:  1200.  Assessment:  Primary concerns today: First Visit: Spouse ID [id number]   Overweight and hyperlipidemia. Working on weight loss and hyperlipidemia. He had rotator cuff surgery a few months ago. Has been less active since then and gained about 10 lbs. Still eating a lot of chicken.  Wt is up up. Publishing copy. Activity level has decreased due to his recent surgery . Walks some but will do more. Starting to be more active as his shoulder heals and when his doctor releases him.   Wt Readings from Last 3 Encounters:  08/16/19 (!) 253 lb (114.8 kg)  05/07/19 243 lb (110.2 kg)  04/27/19 247 lb (112 kg)   Ht Readings from Last 3 Encounters:  08/16/19 6\' 2"  (1.88 m)  05/07/19 6\' 2"  (1.88 m)  04/27/19 6\' 2"  (1.88 m)   Body mass index is 32.48 kg/m. @BMIFA @ Facility age limit for growth percentiles is 20 years. Facility age limit for growth percentiles is 20 years. CMP Latest Ref Rng & Units 04/27/2019 01/08/2019 01/06/2018  Glucose 70 - 99 mg/dL 87 85 86  BUN 6 - 20 mg/dL 14 21 13   Creatinine 0.61 - 1.24 mg/dL 0.99 0.90 0.93  Sodium 135 - 145 mmol/L 138 139 142  Potassium 3.5 - 5.1 mmol/L 3.9 4.1 4.2  Chloride 98 - 111 mmol/L 105 101 100  CO2 22 - 32 mmol/L 22 23 25   Calcium 8.9 - 10.3 mg/dL 8.9 9.6 9.6  Total Protein 6.0 - 8.5 g/dL - 7.2 7.2  Total Bilirubin 0.0 - 1.2 mg/dL - 0.3 0.7  Alkaline Phos 39 - 117 IU/L - 77 65  AST 0 - 40 IU/L - 19 18  ALT 0 - 44 IU/L - 16 18    Lipid Panel     Component Value Date/Time   CHOL 217 (H) 01/08/2019 1025   TRIG 176 (H) 01/08/2019 1025   HDL 49 01/08/2019 1025   CHOLHDL 4.4 01/08/2019 1025   CHOLHDL 4.4 12/26/2014 1148   VLDL 33 (H) 12/26/2014 1148   LDLCALC 137 (H) 01/08/2019 1025   LABVLDL 31 01/08/2019 1025    Preferred Learning Style:  No preference indicated   Learning Readiness:  Ready  Change in progress   MEDICATIONS:    DIETARY INTAKE:    24-hr recall:  Eats 3 meals and 1-2 snack per day  Usual physical activity: walks, firefighter  Estimated energy needs: 1800-2000 calories 200 g carbohydrates 135 g protein 50 g fat  Progress Towards Goal(s):  In progress.   Nutritional Diagnosis:  NB-1.1 Food and nutrition-related knowledge deficit As related to obesity and hyperlipidemia.  As evidenced by BMI 32 and TCHOL 215 ng/dl..    Intervention:  Nutrition and low cholesterol heart healthy education provided on My Plate, CHO counting, meal planning, portion sizes, timing of meals, avoiding snacks between meals  benefits of exercising 60 minutes per day and prevention of  DM. Weight loss tips. Goals  Eat a boiled egg after supper if hungry or other lower carb vegetables. Continue to drink  Water., Increase exercise to walking 2 miles at least 3 times per wek Increase high fiber foods.     Teaching Method Utilized:  Visual Auditory Hands on  Handouts given during visit include:  Cholesterol lowering diet    Barriers to learning/adherence to lifestyle change: none  Demonstrated degree of understanding via:  Teach Back   Monitoring/Evaluation:  Dietary  intake, exercise, , and body weight in 2 week(s).

## 2019-08-16 NOTE — Patient Instructions (Addendum)
Goals  Eat a boiled egg after supper if hungry or other lower carb vegetables. Continue to drink  Water., Increase exercise to walking 2 miles at least 3 times per wek Increase high fiber foods.

## 2019-08-25 MED FILL — CYCLOBENZAPRINE HCL 10 MG T: 10 | 14 days supply | Qty: 42 | Fill #1

## 2019-08-26 MED FILL — MELOXICAM 7.5 MG TABLET: 7.5 | 30 days supply | Qty: 60 | Fill #2

## 2019-08-31 ENCOUNTER — Other Ambulatory Visit: Payer: Self-pay

## 2019-08-31 ENCOUNTER — Encounter: Payer: Self-pay | Admitting: Nutrition

## 2019-08-31 ENCOUNTER — Encounter: Payer: No Typology Code available for payment source | Attending: Family Medicine | Admitting: Nutrition

## 2019-08-31 VITALS — Ht 74.0 in | Wt 245.0 lb

## 2019-08-31 DIAGNOSIS — E669 Obesity, unspecified: Secondary | ICD-10-CM

## 2019-08-31 DIAGNOSIS — E782 Mixed hyperlipidemia: Secondary | ICD-10-CM

## 2019-08-31 DIAGNOSIS — E7800 Pure hypercholesterolemia, unspecified: Secondary | ICD-10-CM

## 2019-08-31 DIAGNOSIS — M1000 Idiopathic gout, unspecified site: Secondary | ICD-10-CM

## 2019-08-31 NOTE — Patient Instructions (Addendum)
  Goals  Lose 5 lbs Try to eat breakfast. Continue to walking as much as possible.

## 2019-08-31 NOTE — Progress Notes (Signed)
Second visit  [facility] Spouse ID [id number] Medical Nutrition Therapy:  Appt start time: 1100 end time:  1200.  Assessment:  Primary concerns today: First Visit: Spouse ID [id number]   Overweight and hyperlipidemia. Eating high fiber foods, eaitng more salads and high fiber foods.. Has been walking a lot more. Walks about 2 miles three times a week with his wife. Lost 8 lbs.  Making better food choices, eating more fiber. Has Gout from time to time. He thinks that is what is causing his back problems, so he is taking his Colchicine to see if it helps. Complains of some ED. Advised to talk to his PCP.  Wt Readings from Last 3 Encounters:  08/16/19 (!) 253 lb (114.8 kg)  05/07/19 243 lb (110.2 kg)  04/27/19 247 lb (112 kg)   Ht Readings from Last 3 Encounters:  08/16/19 6\' 2"  (1.88 m)  05/07/19 6\' 2"  (1.88 m)  04/27/19 6\' 2"  (1.88 m)   There is no height or weight on file to calculate BMI. @BMIFA @ Facility age limit for growth percentiles is 20 years. Facility age limit for growth percentiles is 20 years. CMP Latest Ref Rng & Units 04/27/2019 01/08/2019 01/06/2018  Glucose 70 - 99 mg/dL 87 85 86  BUN 6 - 20 mg/dL 14 21 13   Creatinine 0.61 - 1.24 mg/dL 0.99 0.90 0.93  Sodium 135 - 145 mmol/L 138 139 142  Potassium 3.5 - 5.1 mmol/L 3.9 4.1 4.2  Chloride 98 - 111 mmol/L 105 101 100  CO2 22 - 32 mmol/L 22 23 25   Calcium 8.9 - 10.3 mg/dL 8.9 9.6 9.6  Total Protein 6.0 - 8.5 g/dL - 7.2 7.2  Total Bilirubin 0.0 - 1.2 mg/dL - 0.3 0.7  Alkaline Phos 39 - 117 IU/L - 77 65  AST 0 - 40 IU/L - 19 18  ALT 0 - 44 IU/L - 16 18    Lipid Panel     Component Value Date/Time   CHOL 217 (H) 01/08/2019 1025   TRIG 176 (H) 01/08/2019 1025   HDL 49 01/08/2019 1025   CHOLHDL 4.4 01/08/2019 1025   CHOLHDL 4.4 12/26/2014 1148   VLDL 33 (H) 12/26/2014 1148   LDLCALC 137 (H) 01/08/2019 1025   LABVLDL 31 01/08/2019 1025    Preferred Learning Style:  No preference indicated   Learning  Readiness:  Ready  Change in progress   MEDICATIONS:   DIETARY INTAKE:    24-hr recall:  B) Coffee -skips breakfast. L) salad, hummus, carrots, water D) Stir fry vegetables,  Or chicken, corn salad, broccoli, salad, water  Usual physical activity: walks, firefighter  Estimated energy needs: 1800-2000 calories 200 g carbohydrates 135 g protein 50 g fat  Progress Towards Goal(s):  In progress.   Nutritional Diagnosis:  NB-1.1 Food and nutrition-related knowledge deficit As related to obesity and hyperlipidemia.  As evidenced by BMI 32 and TCHOL 215 ng/dl..    Intervention:  Nutrition and low cholesterol heart healthy education provided on My Plate, CHO counting, meal planning, portion sizes, timing of meals, avoiding snacks between meals  benefits of exercising 60 minutes per day and prevention of  DM. Weight loss tips.   Goals  Lose 5 lbs Try to eat breakfast. Continue to walking as much as possible.  Aim for 25 g of Fiber per day.  Teaching Method Utilized:  Visual Auditory Hands on  Handouts given during visit include:  Cholesterol lowering diet    Barriers to learning/adherence to lifestyle change: none  Demonstrated degree of understanding via:  Teach Back   Monitoring/Evaluation:  Dietary intake, exercise, , and body weight in 2 week(s).

## 2019-09-06 ENCOUNTER — Encounter: Payer: Self-pay | Admitting: Emergency Medicine

## 2019-09-06 ENCOUNTER — Encounter: Payer: Self-pay | Admitting: Family Medicine

## 2019-09-06 ENCOUNTER — Other Ambulatory Visit: Payer: Self-pay

## 2019-09-06 ENCOUNTER — Other Ambulatory Visit: Payer: Self-pay | Admitting: Emergency Medicine

## 2019-09-06 ENCOUNTER — Ambulatory Visit (INDEPENDENT_AMBULATORY_CARE_PROVIDER_SITE_OTHER): Payer: No Typology Code available for payment source

## 2019-09-06 ENCOUNTER — Telehealth: Payer: Self-pay | Admitting: Family Medicine

## 2019-09-06 ENCOUNTER — Ambulatory Visit (INDEPENDENT_AMBULATORY_CARE_PROVIDER_SITE_OTHER): Payer: No Typology Code available for payment source | Admitting: Emergency Medicine

## 2019-09-06 VITALS — BP 122/87 | HR 69 | Temp 97.5°F | Resp 16 | Ht 73.0 in | Wt 241.0 lb

## 2019-09-06 DIAGNOSIS — M545 Low back pain, unspecified: Secondary | ICD-10-CM

## 2019-09-06 DIAGNOSIS — M549 Dorsalgia, unspecified: Secondary | ICD-10-CM

## 2019-09-06 DIAGNOSIS — N529 Male erectile dysfunction, unspecified: Secondary | ICD-10-CM | POA: Diagnosis not present

## 2019-09-06 DIAGNOSIS — M109 Gout, unspecified: Secondary | ICD-10-CM

## 2019-09-06 DIAGNOSIS — S39012A Strain of muscle, fascia and tendon of lower back, initial encounter: Secondary | ICD-10-CM

## 2019-09-06 MED ORDER — INDOMETHACIN 50 MG PO CAPS: 50.0000 mg | ORAL_CAPSULE | Freq: Two times a day (BID) | ORAL | 1 refills | Status: DC | PRN

## 2019-09-06 MED ORDER — COLCHICINE 0.6 MG PO TABS: 0.6000 mg | ORAL_TABLET | Freq: Every day | ORAL | 1 refills | Status: DC | PRN

## 2019-09-06 MED ORDER — ALLOPURINOL 300 MG PO TABS: 300.0000 mg | ORAL_TABLET | Freq: Every day | ORAL | 1 refills | Status: DC

## 2019-09-06 MED ORDER — METHYLPREDNISOLONE ACETATE 80 MG/ML IJ SUSP
80.0000 mg | Freq: Once | INTRAMUSCULAR | Status: AC
  Administered 2019-09-06: 80 mg via INTRAMUSCULAR

## 2019-09-06 MED ORDER — TADALAFIL 20 MG PO TABS: 10.0000 mg | ORAL_TABLET | ORAL | 11 refills | Status: DC | PRN

## 2019-09-06 MED ORDER — PREDNISONE 20 MG PO TABS: 40.0000 mg | ORAL_TABLET | Freq: Every day | ORAL | 0 refills | Status: AC

## 2019-09-06 MED FILL — TADALAFIL 20 MG TABS: 20 | 30 days supply | Qty: 5 | Fill #0

## 2019-09-06 MED FILL — predniSONE 20 MG TABS: 20 | 5 days supply | Qty: 10 | Fill #0

## 2019-09-06 MED FILL — ALLOPURINOL 300 MG TABS: 300 | 90 days supply | Qty: 90 | Fill #0

## 2019-09-06 MED FILL — COLCHICINE 0.6 MG TABS: 0.6 | 30 days supply | Qty: 30 | Fill #0

## 2019-09-06 NOTE — Patient Instructions (Addendum)
   If you have lab work done today you will be contacted with your lab results within the next 2 weeks.  If you have not heard from us then please contact us. The fastest way to get your results is to register for My Chart.   IF you received an x-ray today, you will receive an invoice from [facility]. Please contact [facility] at [phone] with questions or concerns regarding your invoice.   IF you received labwork today, you will receive an invoice from [facility]. Please contact [facility] at [phone] with questions or concerns regarding your invoice.   Our billing staff will not be able to assist you with questions regarding bills from these companies.  You will be contacted with the lab results as soon as they are available. The fastest way to get your results is to activate your My Chart account. Instructions are located on the last page of this paperwork. If you have not heard from us regarding the results in 2 weeks, please contact this office.     Acute Back Pain, Adult Acute back pain is sudden and usually short-lived. It is often caused by an injury to the muscles and tissues in the back. The injury may result from:  A muscle or ligament getting overstretched or torn (strained). Ligaments are tissues that connect bones to each other. Lifting something improperly can cause a back strain.  Wear and tear (degeneration) of the spinal disks. Spinal disks are circular tissue that provides cushioning between the bones of the spine (vertebrae).  Twisting motions, such as while playing sports or doing yard work.  A hit to the back.  Arthritis. You may have a physical exam, lab tests, and imaging tests to find the cause of your pain. Acute back pain usually goes away with rest and home care. Follow these instructions at home: Managing pain, stiffness, and swelling  Take over-the-counter and prescription medicines only as told by your health care  provider.  Your health care provider may recommend applying ice during the first 24-48 hours after your pain starts. To do this: ? Put ice in a plastic bag. ? Place a towel between your skin and the bag. ? Leave the ice on for 20 minutes, 2-3 times a day.  If directed, apply heat to the affected area as often as told by your health care provider. Use the heat source that your health care provider recommends, such as a moist heat pack or a heating pad. ? Place a towel between your skin and the heat source. ? Leave the heat on for 20-30 minutes. ? Remove the heat if your skin turns bright red. This is especially important if you are unable to feel pain, heat, or cold. You have a greater risk of getting burned. Activity   Do not stay in bed. Staying in bed for more than 1-2 days can delay your recovery.  Sit up and stand up straight. Avoid leaning forward when you sit, or hunching over when you stand. ? If you work at a desk, sit close to it so you do not need to lean over. Keep your chin tucked in. Keep your neck drawn back, and keep your elbows bent at a right angle. Your arms should look like the letter "L." ? Sit high and close to the steering wheel when you drive. Add lower back (lumbar) support to your car seat, if needed.  Take short walks on even surfaces as soon as you are able. Try   to increase the length of time you walk each day.  Do not sit, drive, or stand in one place for more than 30 minutes at a time. Sitting or standing for long periods of time can put stress on your back.  Do not drive or use heavy machinery while taking prescription pain medicine.  Use proper lifting techniques. When you bend and lift, use positions that put less stress on your back: ? Bend your knees. ? Keep the load close to your body. ? Avoid twisting.  Exercise regularly as told by your health care provider. Exercising helps your back heal faster and helps prevent back injuries by keeping muscles  strong and flexible.  Work with a physical therapist to make a safe exercise program, as recommended by your health care provider. Do any exercises as told by your physical therapist. Lifestyle  Maintain a healthy weight. Extra weight puts stress on your back and makes it difficult to have good posture.  Avoid activities or situations that make you feel anxious or stressed. Stress and anxiety increase muscle tension and can make back pain worse. Learn ways to manage anxiety and stress, such as through exercise. General instructions  Sleep on a firm mattress in a comfortable position. Try lying on your side with your knees slightly bent. If you lie on your back, put a pillow under your knees.  Follow your treatment plan as told by your health care provider. This may include: ? Cognitive or behavioral therapy. ? Acupuncture or massage therapy. ? Meditation or yoga. Contact a health care provider if:  You have pain that is not relieved with rest or medicine.  You have increasing pain going down into your legs or buttocks.  Your pain does not improve after 2 weeks.  You have pain at night.  You lose weight without trying.  You have a fever or chills. Get help right away if:  You develop new bowel or bladder control problems.  You have unusual weakness or numbness in your arms or legs.  You develop nausea or vomiting.  You develop abdominal pain.  You feel faint. Summary  Acute back pain is sudden and usually short-lived.  Use proper lifting techniques. When you bend and lift, use positions that put less stress on your back.  Take over-the-counter and prescription medicines and apply heat or ice as directed by your health care provider. This information is not intended to replace advice given to you by your health care provider. Make sure you discuss any questions you have with your health care provider. Document Revised: 04/28/2018 Document Reviewed: 08/21/2016 Elsevier  Patient Education  2020 [employer]  

## 2019-09-06 NOTE — Telephone Encounter (Signed)
I have called pt back and he stated that he needed his allopurinol refilled. I stated understanding. The rx has been sent to pharmacy.

## 2019-09-06 NOTE — Progress Notes (Signed)
Adam Cunningham. 59 y.o.   Chief Complaint  Patient presents with  . Back Pain    for 2 weeks without injury  . Hip Pain    RIGHT for about 2 weeks    HISTORY OF PRESENT ILLNESS: This is a 59 y.o. male complaining of lumbar pain that started 2 weeks ago. Progressively getting better but pain still present. Denies injury. Pain worse with movement and better with rest. No associated symptoms. Some radiation to the right hip. No other complaints or medical concerns today.  HPI   Prior to Admission medications   Medication Sig Start Date End Date Taking? Authorizing Provider  allopurinol (ZYLOPRIM) 300 MG tablet Take 1 tablet (300 mg total) by mouth daily. 01/08/19  Yes [name], MD  colchicine 0.6 MG tablet Take 1 tablet (0.6 mg total) by mouth daily. Patient taking differently: Take 0.6 mg by mouth daily as needed (gout flare ups).  01/08/19  Yes [name], MD  cyclobenzaprine (FLEXERIL) 10 MG tablet Take 1 tablet (10 mg total) by mouth 3 (three) times daily. 05/10/19  Yes Carole Civil, MD  ibuprofen (ADVIL) 800 MG tablet Take 1 tablet (800 mg total) by mouth every 8 (eight) hours as needed. 04/29/19  Yes Carole Civil, MD  indomethacin (INDOCIN) 50 MG capsule Take 1 capsule (50 mg total) by mouth 2 (two) times daily with a meal. Patient taking differently: Take 50 mg by mouth 2 (two) times daily as needed (gout pain.).  12/27/13  Yes Darlyne Russian, MD  meloxicam (MOBIC) 7.5 MG tablet Take 1 tablet (7.5 mg total) by mouth 2 (two) times daily. 06/09/19  Yes Carole Civil, MD  oxyCODONE-acetaminophen (PERCOCET/ROXICET) 5-325 MG tablet Take 1 tablet by mouth every 4 (four) hours as needed for severe pain. 04/29/19  Yes Carole Civil, MD  methocarbamol (ROBAXIN) 500 MG tablet Take 1 tablet (500 mg total) by mouth 4 (four) times daily. 04/29/19   Carole Civil, MD  promethazine (PHENERGAN) 12.5 MG tablet Take 1 tablet (12.5 mg total) by mouth every  6 (six) hours as needed for nausea or vomiting. 04/29/19   Carole Civil, MD    No Known Allergies  Patient Active Problem List   Diagnosis Date Noted  . Hyperlipidemia 11/26/2011  . Gout 11/26/2011  . Low testosterone 11/26/2011  . H/O prostate cancer 05/26/2007    Past Medical History:  Diagnosis Date  . Cancer [facility])    prostate  . Gout     Past Surgical History:  Procedure Laterality Date  . BICEPS TENDON REPAIR    . PROSTATE SURGERY    . SHOULDER OPEN ROTATOR CUFF REPAIR Left 04/29/2019   Procedure: OPEN ROTATOR CUFF REPAIR LEFT SHOULDER;  Surgeon: Carole Civil, MD;  Location: [facility];  Service: Orthopedics;  Laterality: Left;  and interscalene block     Social History   Socioeconomic History  . Marital status: Married    Spouse name: Not on file  . Number of children: Not on file  . Years of education: Not on file  . Highest education level: Not on file  Occupational History  . 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 Occupation: Airline pilot  Tobacco Use  . Smoking status: Never Smoker  . Smokeless tobacco: Never Used  Vaping Use  . Vaping Use: Never used  Substance and Sexual Activity  . Alcohol use: No  . Drug use: No  . Sexual activity: Yes  Other Topics  Concern  . 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   Social research officer, government   Education: [employer]   Exercise: Yes   Social Determinants of Radio broadcast assistant Strain:   . Difficulty of Paying Living Expenses:   Food Insecurity:   . Worried About Charity fundraiser in the Last Year:   . Arboriculturist in the Last Year:   Transportation Needs:   . Film/video editor (Medical):   Marland Kitchen Lack of Transportation (Non-Medical):   Physical Activity:   . Days of Exercise per Week:   . Minutes of Exercise per Session:   Stress:   . Feeling of Stress :   Social Connections:   . Frequency of Communication with Friends and Family:   . Frequency of Social Gatherings with  Friends and Family:   . Attends Religious Services:   . Active Member of Clubs or Organizations:   . Attends Archivist Meetings:   Marland Kitchen Marital Status:   Intimate Partner Violence:   . Fear of Current or Ex-Partner:   . Emotionally Abused:   Marland Kitchen Physically Abused:   . Sexually Abused:     Family History  Problem Relation Age of Onset  . Cancer Mother        lung  . Cancer Father        prostate ca  . Cirrhosis Father        non-alcoholic  . Cancer Maternal Grandfather        Prostate  . Colon cancer Neg Hx   . Colon polyps Neg Hx   . Esophageal cancer Neg Hx   . Rectal cancer Neg Hx   . Stomach cancer Neg Hx      Review of Systems  Constitutional: Negative.  Negative for chills and fever.  HENT: Negative.  Negative for congestion and sore throat.   Respiratory: Negative.  Negative for cough and shortness of breath.   Cardiovascular: Negative.  Negative for chest pain and palpitations.  Gastrointestinal: Negative for abdominal pain, blood in stool, constipation, diarrhea, nausea and vomiting.  Genitourinary: Negative.  Negative for dysuria and hematuria.  Musculoskeletal: Positive for back pain and joint pain. Negative for myalgias.  Skin: Negative.  Negative for rash.  Neurological: Negative.  Negative for dizziness and headaches.  All other systems reviewed and are negative.    Vitals:   09/06/19 1048  BP: 122/87  Pulse: 69  Resp: 16  Temp: (!) 97.5 F (36.4 C)  SpO2: 96%     Physical Exam Constitutional:      Appearance: Normal appearance.  HENT:     Head: Normocephalic.  Eyes:     Extraocular Movements: Extraocular movements intact.     Pupils: Pupils are equal, round, and reactive to light.  Cardiovascular:     Rate and Rhythm: Normal rate and regular rhythm.     Pulses: Normal pulses.     Heart sounds: Normal heart sounds.  Pulmonary:     Effort: Pulmonary effort is normal.     Breath sounds: Normal breath sounds.  Abdominal:      Palpations: Abdomen is soft.     Tenderness: There is no abdominal tenderness.  Musculoskeletal:        General: Normal range of motion.     Cervical back: Normal range of motion and neck supple.     Lumbar back: No swelling, edema, spasms, tenderness or bony tenderness.  Skin:  General: Skin is warm and dry.     Capillary Refill: Capillary refill takes less than 2 seconds.  Neurological:     General: No focal deficit present.     Mental Status: He is alert and oriented to person, place, and time.     Sensory: No sensory deficit.     Motor: No weakness.     Coordination: Coordination normal.     Gait: Gait normal.     Deep Tendon Reflexes: Reflexes normal.  Psychiatric:        Mood and Affect: Mood normal.        Behavior: Behavior normal.    DG Lumbar Spine 2-3 Views  Result Date: 09/06/2019 CLINICAL DATA:  Low back pain.  No known injury. EXAM: LUMBAR SPINE - 2-3 VIEW COMPARISON:  None. FINDINGS: No fracture. 0.3 cm retrolisthesis L4 on L5 noted. There is loss of disc space height and anterior endplate spurring in the lower thoracic spine most notable at T12-L1. Loss of disc space height and endplate spurring are also seen L1-2. Vacuum disc phenomenon L5-S1 noted. Paraspinous structures unremarkable. IMPRESSION: No acute abnormality. Mild to moderate appearing spondylosis. Electronically Signed   By: Inge Rise M.D.   On: 09/06/2019 11:54     ASSESSMENT & PLAN: Gregg was seen today for back pain and hip pain.  Diagnoses and all orders for this visit:  Lumbar pain -     DG Lumbar Spine 2-3 Views -     methylPREDNISolone acetate (DEPO-MEDROL) injection 80 mg  Musculoskeletal back pain  Erectile dysfunction, unspecified erectile dysfunction type -     tadalafil (CIALIS) 20 MG tablet; Take 0.5-1 tablets (10-20 mg total) by mouth every other day as needed for erectile dysfunction.  Acute myofascial strain of lumbar region, initial encounter -     predniSONE  (DELTASONE) 20 MG tablet; Take 2 tablets (40 mg total) by mouth daily with breakfast for 5 days.     Patient Instructions       If you have lab work done today you will be contacted with your lab results within the next 2 weeks.  If you have not heard from [place] then please contact us. The fastest way to get your results is to register for My Chart.   IF you received an x-ray today, you will receive an invoice from [facility]. Please contact [facility] at [phone] with questions or concerns regarding your invoice.   IF you received labwork today, you will receive an invoice from [place]. Please contact [facility] at [phone] with questions or concerns regarding your invoice.   Our billing staff will not be able to assist you with questions regarding bills from these companies.  You will be contacted with the lab results as soon as they are available. The fastest way to get your results is to activate your My Chart account. Instructions are located on the last page of this paperwork. If you have not heard from [place] regarding the results in 2 weeks, please contact this office.     Acute Back Pain, Adult Acute back pain is sudden and usually short-lived. It is often caused by an injury to the muscles and tissues in the back. The injury may result from:  A muscle or ligament getting overstretched or torn (strained). Ligaments are tissues that connect bones to each other. Lifting something improperly can cause a back strain.  Wear and tear (degeneration) of the spinal disks. Spinal disks are circular tissue that provides cushioning between  the bones of the spine (vertebrae).  Twisting motions, such as while playing sports or doing yard work.  A hit to the back.  Arthritis. You may have a physical exam, lab tests, and imaging tests to find the cause of your pain. Acute back pain usually goes away with rest and home care. Follow these instructions at  home: Managing pain, stiffness, and swelling  Take over-the-counter and prescription medicines only as told by your health care provider.  Your health care provider may recommend applying ice during the first 24-48 hours after your pain starts. To do this: ? Put ice in a plastic bag. ? Place a towel between your skin and the bag. ? Leave the ice on for 20 minutes, 2-3 times a day.  If directed, apply heat to the affected area as often as told by your health care provider. Use the heat source that your health care provider recommends, such as a moist heat pack or a heating pad. ? Place a towel between your skin and the heat source. ? Leave the heat on for 20-30 minutes. ? Remove the heat if your skin turns bright red. This is especially important if you are unable to feel pain, heat, or cold. You have a greater risk of getting burned. Activity   Do not stay in bed. Staying in bed for more than 1-2 days can delay your recovery.  Sit up and stand up straight. Avoid leaning forward when you sit, or hunching over when you stand. ? If you work at a desk, sit close to it so you do not need to lean over. Keep your chin tucked in. Keep your neck drawn back, and keep your elbows bent at a right angle. Your arms should look like the letter "L." ? Sit high and close to the steering wheel when you drive. Add lower back (lumbar) support to your car seat, if needed.  Take short walks on even surfaces as soon as you are able. Try to increase the length of time you walk each day.  Do not sit, drive, or stand in one place for more than 30 minutes at a time. Sitting or standing for long periods of time can put stress on your back.  Do not drive or use heavy machinery while taking prescription pain medicine.  Use proper lifting techniques. When you bend and lift, use positions that put less stress on your back: ? [place] your knees. ? Keep the load close to your body. ? Avoid twisting.  Exercise regularly  as told by your health care provider. Exercising helps your back heal faster and helps prevent back injuries by keeping muscles strong and flexible.  Work with a physical therapist to make a safe exercise program, as recommended by your health care provider. Do any exercises as told by your physical therapist. Lifestyle  Maintain a healthy weight. Extra weight puts stress on your back and makes it difficult to have good posture.  Avoid activities or situations that make you feel anxious or stressed. Stress and anxiety increase muscle tension and can make back pain worse. Learn ways to manage anxiety and stress, such as through exercise. General instructions  Sleep on a firm mattress in a comfortable position. Try lying on your side with your knees slightly bent. If you lie on your back, put a pillow under your knees.  Follow your treatment plan as told by your health care provider. This may include: ? Cognitive or behavioral therapy. ? Acupuncture or massage  therapy. ? Meditation or yoga. Contact a health care provider if:  You have pain that is not relieved with rest or medicine.  You have increasing pain going down into your legs or buttocks.  Your pain does not improve after 2 weeks.  You have pain at night.  You lose weight without trying.  You have a fever or chills. Get help right away if:  You develop new bowel or bladder control problems.  You have unusual weakness or numbness in your arms or legs.  You develop nausea or vomiting.  You develop abdominal pain.  You feel faint. Summary  Acute back pain is sudden and usually short-lived.  Use proper lifting techniques. When you bend and lift, use positions that put less stress on your back.  Take over-the-counter and prescription medicines and apply heat or ice as directed by your health care provider. This information is not intended to replace advice given to you by your health care provider. Make sure you  discuss any questions you have with your health care provider. Document Revised: 04/28/2018 Document Reviewed: 08/21/2016 Elsevier Patient Education  2020 Elsevier Inc.     Agustina Caroli, MD [facility]

## 2019-09-06 NOTE — Telephone Encounter (Signed)
Pt called and stated the [place] Pt stated the wrong medication was sent in I told pt I looks like the Colchicine was discontinued and the replacement drug was sent in instead. Pt would like a call regarding this.   indomethacin (INDOCIN) 50 MG capsule [[id number]]   Please advise.

## 2019-09-06 NOTE — Telephone Encounter (Signed)
[facility] requires pt notify you that they would like to extend their referral to Dr. Aline Brochure to follow up rotator cuff surgery unsure that theres any action that needs to be taken.

## 2019-09-07 NOTE — Telephone Encounter (Signed)
Please clarify - do I need to place a new referral, or do we just need to send a note to referrals to extend his previous referral to current provider.  Let me know how I can assist further

## 2019-09-08 NOTE — Telephone Encounter (Signed)
Can we extend the referral to [facility] this pt has an appointment at the end of August to follow up please let us know if you need anything else

## 2019-09-08 NOTE — Telephone Encounter (Signed)
I spoke with the patient yesterday he stated he just wanted you to know that this referral was being extended that he did not need for anything else to be done. An FYI

## 2019-09-13 ENCOUNTER — Ambulatory Visit: Payer: No Typology Code available for payment source | Admitting: Orthopedic Surgery

## 2019-09-17 ENCOUNTER — Other Ambulatory Visit: Payer: Self-pay

## 2019-09-17 ENCOUNTER — Encounter: Payer: Self-pay | Admitting: Orthopedic Surgery

## 2019-09-17 ENCOUNTER — Ambulatory Visit (INDEPENDENT_AMBULATORY_CARE_PROVIDER_SITE_OTHER): Payer: No Typology Code available for payment source | Admitting: Orthopedic Surgery

## 2019-09-17 VITALS — BP 123/82 | HR 72

## 2019-09-17 DIAGNOSIS — Z9889 Other specified postprocedural states: Secondary | ICD-10-CM

## 2019-09-17 NOTE — Progress Notes (Signed)
Chief Complaint  Patient presents with  . Shoulder Pain   04/29/19 DOS left rotator cuff repair with augment    Only complaint is that he has to sleep in a different position  He has 170 degrees of elevation versus 180 on the right, abduction flexion strength is excellent   PROCEDURE:  Procedure(s) with comments: OPEN ROTATOR CUFF REPAIR LEFT SHOULDER with dermal acellular graft from Arthrex 1.5 thickness (Left) - and interscalene block  Cpt 23420   Findings at surgery massive cuff tear supraspinatus into the infraspinatus large humeral head defect with posterior subluxation of the rotator cuff 5 cm retraction posteriorly  Implants Arthrex speed bridge dermal acellular graft from Arthrex 1.5 thickness  Return for 64-month checkup and possible release

## 2019-09-20 ENCOUNTER — Encounter: Payer: No Typology Code available for payment source | Attending: Family Medicine | Admitting: Nutrition

## 2019-09-20 ENCOUNTER — Encounter: Payer: Self-pay | Admitting: Nutrition

## 2019-09-20 ENCOUNTER — Other Ambulatory Visit: Payer: Self-pay

## 2019-09-20 VITALS — Ht 72.0 in | Wt 240.0 lb

## 2019-09-20 DIAGNOSIS — E782 Mixed hyperlipidemia: Secondary | ICD-10-CM | POA: Insufficient documentation

## 2019-09-20 DIAGNOSIS — E669 Obesity, unspecified: Secondary | ICD-10-CM | POA: Insufficient documentation

## 2019-09-20 DIAGNOSIS — M1000 Idiopathic gout, unspecified site: Secondary | ICD-10-CM | POA: Insufficient documentation

## 2019-09-20 NOTE — Progress Notes (Signed)
Third  visit  [facility] Spouse Spouse ID [id number] Medical Nutrition Therapy:  Appt start time: 1000  end time:  1015  Assessment:  Primary concerns today: Third Visit: Spouse ID [id number]   Overweight and hyperlipidemia.   Has lost 12 lbs in the last month. Been walking 1 1/2 miles every other day. Can't lift much due to shoulder  Reading food labels.    Wt Readings from Last 3 Encounters:  09/06/19 241 lb (109.3 kg)  08/31/19 245 lb (111.1 kg)  08/16/19 (!) 253 lb (114.8 kg)   Ht Readings from Last 3 Encounters:  09/06/19 6\' 1"  (1.854 m)  08/31/19 6\' 2"  (1.88 m)  08/16/19 6\' 2"  (1.88 m)   There is no height or weight on file to calculate BMI. @BMIFA @ Facility age limit for growth percentiles is 20 years. Facility age limit for growth percentiles is 20 years. CMP Latest Ref Rng & Units 04/27/2019 01/08/2019 01/06/2018  Glucose 70 - 99 mg/dL 87 85 86  BUN 6 - 20 mg/dL 14 21 13   Creatinine 0.61 - 1.24 mg/dL 0.99 0.90 0.93  Sodium 135 - 145 mmol/L 138 139 142  Potassium 3.5 - 5.1 mmol/L 3.9 4.1 4.2  Chloride 98 - 111 mmol/L 105 101 100  CO2 22 - 32 mmol/L 22 23 25   Calcium 8.9 - 10.3 mg/dL 8.9 9.6 9.6  Total Protein 6.0 - 8.5 g/dL - 7.2 7.2  Total Bilirubin 0.0 - 1.2 mg/dL - 0.3 0.7  Alkaline Phos 39 - 117 IU/L - 77 65  AST 0 - 40 IU/L - 19 18  ALT 0 - 44 IU/L - 16 18    Lipid Panel     Component Value Date/Time   CHOL 217 (H) 01/08/2019 1025   TRIG 176 (H) 01/08/2019 1025   HDL 49 01/08/2019 1025   CHOLHDL 4.4 01/08/2019 1025   CHOLHDL 4.4 12/26/2014 1148   VLDL 33 (H) 12/26/2014 1148   LDLCALC 137 (H) 01/08/2019 1025   LABVLDL 31 01/08/2019 1025    Preferred Learning Style:  No preference indicated   Learning Readiness:  Ready  Change in progress   MEDICATIONS:   DIETARY INTAKE:    24-hr recall:  B)  Crack an egg 2, coffee L)  Spaghetti, 2 slices bread thins, water   D)  Spring mix with veggies and [place] dressing. Or lean C, cottage cheese.   Usual  physical activity: walks, firefighter  Estimated energy needs: 1800-2000 calories 200 g carbohydrates 135 g protein 50 g fat  Progress Towards Goal(s):  In progress.   Nutritional Diagnosis:  NB-1.1 Food and nutrition-related knowledge deficit As related to obesity and hyperlipidemia.  As evidenced by BMI 32 and TCHOL 215 ng/dl..    Intervention:  Nutrition and low cholesterol heart healthy education provided on My Plate, CHO counting, meal planning, portion sizes, timing of meals, avoiding snacks between meals  benefits of exercising 60 minutes per day and prevention of  DM. Weight loss tips.  Goals  Goal wt 200 lbs Continue to read labels Increase exercise to 2 miles every other day. Increase high fiber foods TCHOL less than 200 mg/dl.  Teaching Method Utilized:  Visual Auditory Hands on  Handouts given during visit include:  Cholesterol lowering diet    Barriers to learning/adherence to lifestyle change: none  Demonstrated degree of understanding via:  Teach Back   Monitoring/Evaluation:  Dietary intake, exercise, , and body weight in PRN.

## 2019-09-20 NOTE — Patient Instructions (Addendum)
Goals  Goal wt 200 lbs Continue to read labels Increase exercise to 2 miles every other day. Increase high fiber foods TCHOL less than 200 mg/dl.

## 2019-10-28 ENCOUNTER — Ambulatory Visit: Payer: No Typology Code available for payment source | Admitting: Orthopedic Surgery

## 2019-10-31 ENCOUNTER — Encounter: Payer: Self-pay | Admitting: Family Medicine

## 2019-11-01 MED ORDER — PREDNISONE 10 MG (48) PO TBPK: ORAL_TABLET | Freq: Every day | ORAL | 1 refills | Status: DC

## 2019-11-09 MED FILL — TADALAFIL 20 MG TABS: 20 | 30 days supply | Qty: 5 | Fill #1

## 2019-11-12 MED FILL — CYCLOBENZAPRINE HCL 10 MG T: 10 | 14 days supply | Qty: 42 | Fill #2

## 2019-12-26 MED FILL — ALLOPURINOL 300 MG TABS: 300 | 90 days supply | Qty: 90 | Fill #1

## 2020-01-12 ENCOUNTER — Encounter: Payer: No Typology Code available for payment source | Admitting: Family Medicine

## 2020-01-18 ENCOUNTER — Telehealth: Payer: Self-pay | Admitting: Family Medicine

## 2020-01-19 ENCOUNTER — Encounter: Payer: No Typology Code available for payment source | Admitting: Family Medicine

## 2020-01-25 ENCOUNTER — Other Ambulatory Visit: Payer: Self-pay

## 2020-01-25 ENCOUNTER — Other Ambulatory Visit: Payer: Self-pay | Admitting: Family Medicine

## 2020-01-25 ENCOUNTER — Encounter: Payer: Self-pay | Admitting: Family Medicine

## 2020-01-25 ENCOUNTER — Ambulatory Visit (INDEPENDENT_AMBULATORY_CARE_PROVIDER_SITE_OTHER): Payer: 59 | Admitting: Family Medicine

## 2020-01-25 VITALS — BP 124/82 | HR 69 | Temp 98.2°F | Resp 16 | Ht 72.0 in | Wt 242.8 lb

## 2020-01-25 DIAGNOSIS — Z08 Encounter for follow-up examination after completed treatment for malignant neoplasm: Secondary | ICD-10-CM

## 2020-01-25 DIAGNOSIS — Z8546 Personal history of malignant neoplasm of prostate: Secondary | ICD-10-CM | POA: Diagnosis not present

## 2020-01-25 DIAGNOSIS — Z23 Encounter for immunization: Secondary | ICD-10-CM

## 2020-01-25 DIAGNOSIS — Z0000 Encounter for general adult medical examination without abnormal findings: Secondary | ICD-10-CM

## 2020-01-25 DIAGNOSIS — N529 Male erectile dysfunction, unspecified: Secondary | ICD-10-CM | POA: Diagnosis not present

## 2020-01-25 DIAGNOSIS — Z6832 Body mass index (BMI) 32.0-32.9, adult: Secondary | ICD-10-CM | POA: Diagnosis not present

## 2020-01-25 DIAGNOSIS — M549 Dorsalgia, unspecified: Secondary | ICD-10-CM

## 2020-01-25 DIAGNOSIS — Z9889 Other specified postprocedural states: Secondary | ICD-10-CM | POA: Diagnosis not present

## 2020-01-25 DIAGNOSIS — M109 Gout, unspecified: Secondary | ICD-10-CM

## 2020-01-25 DIAGNOSIS — Z131 Encounter for screening for diabetes mellitus: Secondary | ICD-10-CM

## 2020-01-25 DIAGNOSIS — E785 Hyperlipidemia, unspecified: Secondary | ICD-10-CM

## 2020-01-25 DIAGNOSIS — Z0001 Encounter for general adult medical examination with abnormal findings: Secondary | ICD-10-CM | POA: Diagnosis not present

## 2020-01-25 MED ORDER — ALLOPURINOL 300 MG PO TABS: 300.0000 mg | ORAL_TABLET | Freq: Every day | ORAL | 3 refills | Status: DC

## 2020-01-25 MED ORDER — HYDROCODONE-ACETAMINOPHEN 5-325 MG PO TABS: 1.0000 | ORAL_TABLET | Freq: Four times a day (QID) | ORAL | 0 refills | Status: DC | PRN

## 2020-01-25 MED ORDER — CYCLOBENZAPRINE HCL 10 MG PO TABS: 10.0000 mg | ORAL_TABLET | Freq: Three times a day (TID) | ORAL | 3 refills | Status: DC

## 2020-01-25 MED FILL — HYDROCODON-APAP 5-325: 5-325 | 7 days supply | Qty: 30 | Fill #0

## 2020-01-25 MED FILL — CYCLOBENZAPRINE HCL 10 MG T: 10 | 14 days supply | Qty: 42 | Fill #0

## 2020-01-25 NOTE — Patient Instructions (Addendum)
  No change in meds today. Keep up the good work with weight loss. Let me know if there are questions and take care.     If you have lab work done today you will be contacted with your lab results within the next 2 weeks.  If you have not heard from [place] then please contact us. The fastest way to get your results is to register for My Chart.   IF you received an x-ray today, you will receive an invoice from [facility]. Please contact [facility] at [phone] with questions or concerns regarding your invoice.   IF you received labwork today, you will receive an invoice from [place]. Please contact [facility] at [phone] with questions or concerns regarding your invoice.   Our billing staff will not be able to assist you with questions regarding bills from these companies.  You will be contacted with the lab results as soon as they are available. The fastest way to get your results is to activate your My Chart account. Instructions are located on the last page of this paperwork. If you have not heard from [place] regarding the results in 2 weeks, please contact this office.

## 2020-01-25 NOTE — Progress Notes (Signed)
Subjective:  Patient ID: Adam Cunningham., male    DOB: August 13, 1960  Age: 60 y.o. MRN: [id number]  CC:  Chief Complaint  Patient presents with  . Annual Exam    Pt here for physical no concerns today     HPI Adam Cunningham. presents for   Annual physical exam  Left rotator cuff repair in April 2021. Doing well. Released after 6 months.   Overweight/obesity Wt Readings from Last 3 Encounters:  01/25/20 242 lb 12.8 oz (110.1 kg)  09/20/19 240 lb (108.9 kg)  09/06/19 241 lb (109.3 kg)   Body mass index is 32.93 kg/m. Met with nutrition in July and August 2021. Some help. Weight down at home - about 15#. holidays tough.   Gout:  Last flare: unknown - no recent flare with current dose allopurinol. Takes a colchicine if feels like one is starting - few times per year.  Daily meds: Allopurinol 300 mg daily Prn med: Indlomethacin, hydrocodone if needed.  Colchicine has been used as well. Lab Results  Component Value Date   LABURIC 5.7 01/08/2019   Hyperlipidemia: No current meds, borderline ASCVD risk score previously.  Plan for diet/exercise changes with repeat testing in 6 months.  Last readings in December 2020. Weight has improved.  The 10-year ASCVD risk score Adam Bussing DC Brooke Bonito., et al., 2013) is: 8.9%   Values used to calculate the score:     Age: 60 years     Sex: Male     Is Non-Hispanic African American: No     Diabetic: No     Tobacco smoker: No     Systolic Blood Pressure: 412 mmHg     Is BP treated: No     HDL Cholesterol: 49 mg/dL     Total Cholesterol: 217 mg/dL  Lab Results  Component Value Date   CHOL 217 (H) 01/08/2019   HDL 49 01/08/2019   LDLCALC 137 (H) 01/08/2019   TRIG 176 (H) 01/08/2019   CHOLHDL 4.4 01/08/2019   Lab Results  Component Value Date   ALT 16 01/08/2019   AST 19 01/08/2019   ALKPHOS 77 01/08/2019   BILITOT 0.3 01/08/2019    Recurrent low back pain Episodic flares, has used Flexeril or indomethacin as needed, rare  hydrocodone if needed.  Additionally has tried colchicine in the past with some improvement. Flexeril helps with chopping wood. Overall stable symptoms.   Cancer screening Colonoscopy 12/08/2018 History of prostate cancer with prostatectomy in May 2009, urologist Dr. Karsten Ro.  Ongoing PSA monitoring has been stable. Lab Results  Component Value Date   PSA1 <0.1 01/08/2019   PSA1 <0.1 01/06/2018   PSA1 <0.1 01/07/2017   PSA <0.01 12/26/2014   PSA <0.01 12/14/2013   PSA <0.01 12/08/2012   Immunization History  Administered Date(s) Administered  . Influenza Split 12/09/2011  . Influenza,inj,Quad PF,6+ Mos 12/08/2012, 12/14/2013, 12/27/2015, 01/07/2017, 01/06/2018, 11/05/2018, 01/25/2020  . Influenza-Unspecified 11/22/2014  . Tdap 10/25/2010  . Zoster Recombinat (Shingrix) 01/08/2019, 04/14/2019  COVID-19 vaccine: had moderna vaccine and booster past few months.  Flu vaccine, today   Depression screen [facility] 2/9 01/25/2020 09/06/2019 08/16/2019 01/08/2019 11/24/2018  Decreased Interest 0 0 0 0 0  Down, Depressed, Hopeless 0 0 0 0 0  PHQ - 2 Score 0 0 0 0 0   No exam data present Ophthalmology Dr. Herbert Deaner prior. New insurance - may be having to change.   Dental: Every 6 months   Exercise with activity at home, outside  work on the weekends, working around [employer]. Walking 3 times per week - 2 miles.   ED: cialis as needed - mild HA, no vision/hearing changes or CP with exertion.   History Patient Active Problem List   Diagnosis Date Noted  . Hyperlipidemia 11/26/2011  . Gout 11/26/2011  . Low testosterone 11/26/2011  . H/O prostate cancer 05/26/2007   Past Medical History:  Diagnosis Date  . Cancer [facility])    prostate  . Gout    Past Surgical History:  Procedure Laterality Date  . BICEPS TENDON REPAIR    . PROSTATE SURGERY    . SHOULDER OPEN ROTATOR CUFF REPAIR Left 04/29/2019   Procedure: OPEN ROTATOR CUFF REPAIR LEFT SHOULDER;  Surgeon: Carole Civil, MD;  Location:  [facility];  Service: Orthopedics;  Laterality: Left;  and interscalene block    No Known Allergies Prior to Admission medications   Medication Sig Start Date End Date Taking? Authorizing Provider  allopurinol (ZYLOPRIM) 300 MG tablet Take 1 tablet (300 mg total) by mouth daily. 09/06/19  Yes [name], MD  colchicine 0.6 MG tablet Take 1 tablet (0.6 mg total) by mouth daily as needed (gout flare ups). 09/06/19  Yes [name], MD  cyclobenzaprine (FLEXERIL) 10 MG tablet Take 1 tablet (10 mg total) by mouth 3 (three) times daily. 05/10/19  Yes Carole Civil, MD  HYDROcodone-acetaminophen (NORCO/VICODIN) 5-325 MG tablet Take 1 tablet by mouth every 6 (six) hours as needed for moderate pain. PRN   Yes [provider]  ibuprofen (ADVIL) 800 MG tablet Take 1 tablet (800 mg total) by mouth every 8 (eight) hours as needed. 04/29/19  Yes Carole Civil, MD  indomethacin (INDOCIN) 50 MG capsule Take 1 capsule (50 mg total) by mouth 2 (two) times daily as needed (gout pain.). 09/06/19  Yes [name], MD  tadalafil (CIALIS) 20 MG tablet Take 0.5-1 tablets (10-20 mg total) by mouth every other day as needed for erectile dysfunction. 09/06/19  Yes SagardiaInes Bloomer, MD   Social History   Socioeconomic History  . Marital status: Married    Spouse name: Not on file  . Number of children: Not on file  . Years of education: Not on file  . Highest education level: Not on file  Occupational History  . 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 Occupation: Airline pilot  Tobacco Use  . Smoking status: Never Smoker  . Smokeless tobacco: Never Used  Vaping Use  . Vaping Use: Never used  Substance and Sexual Activity  . Alcohol use: No  . Drug use: No  . Sexual activity: Yes  Other Topics Concern  . 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   Social research officer, government   Education: [employer]   Exercise: Yes   Social Determinants of Radio broadcast assistant  Strain: Not on file  Food Insecurity: Not on file  Transportation Needs: Not on file  Physical Activity: Not on file  Stress: Not on file  Social Connections: Not on file  Intimate Partner Violence: Not on file    Review of Systems 13 point review of systems per patient health survey noted.  Negative other than as indicated above or in HPI.    Objective:   Vitals:   01/25/20 0910  BP: 124/82  Pulse: 69  Resp: 16  Temp: 98.2 F (36.8 C)  TempSrc: Temporal  SpO2: 96%  Weight: 242 lb 12.8 oz (110.1  kg)  Height: 6' (1.829 m)     Physical Exam Vitals reviewed.  Constitutional:      Appearance: He is well-developed and well-nourished.  HENT:     Head: Normocephalic and atraumatic.     Right Ear: External ear normal.     Left Ear: External ear normal.  Eyes:     Extraocular Movements: EOM normal.     Conjunctiva/sclera: Conjunctivae normal.     Pupils: Pupils are equal, round, and reactive to light.  Neck:     Thyroid: No thyromegaly.     Vascular: No carotid bruit or JVD.  Cardiovascular:     Rate and Rhythm: Normal rate and regular rhythm.     Heart sounds: Normal heart sounds. No murmur heard.   Pulmonary:     Effort: Pulmonary effort is normal. No respiratory distress.     Breath sounds: Normal breath sounds. No wheezing or rales.  Abdominal:     General: There is no distension.     Palpations: Abdomen is soft.     Tenderness: There is no abdominal tenderness.  Musculoskeletal:        General: No tenderness or edema. Normal range of motion.     Cervical back: Normal range of motion and neck supple.  Lymphadenopathy:     Cervical: No cervical adenopathy.  Skin:    General: Skin is warm and dry.  Neurological:     Mental Status: He is alert and oriented to person, place, and time.     Deep Tendon Reflexes: Reflexes are normal and symmetric.  Psychiatric:        Mood and Affect: Mood and affect normal.        Behavior: Behavior normal.         Assessment & Plan:  Adam Sansoucie. is a 60 y.o. male . Annual physical exam  - -anticipatory guidance as below in AVS, screening labs above. Health maintenance items as above in HPI discussed/recommended as applicable.   Gout, unspecified cause, unspecified chronicity, unspecified site - Plan: allopurinol (ZYLOPRIM) 300 MG tablet Gouty arthritis - Plan: allopurinol (ZYLOPRIM) 300 MG tablet, HYDROcodone-acetaminophen (NORCO/VICODIN) 5-325 MG tablet  - Stable, tolerating current regimen. Medications refilled. Labs pending as above.   Musculoskeletal back pain - Plan: cyclobenzaprine (FLEXERIL) 10 MG tablet  -Flexeril as needed with RTC precautions if worse or more frequent symptoms  BMI 32.0-32.9,adult  -Commended on increased exercise.  Hyperlipidemia, unspecified hyperlipidemia type - Plan: Comprehensive metabolic panel, Lipid panel  -Check labs, continue exercise.  S/P left rotator cuff repair  -Doing well status post repair.  Screening for diabetes mellitus - Plan: Comprehensive metabolic panel, Hemoglobin A1c  Erectile dysfunction, unspecified erectile dysfunction type  -Cialis working well, has refills.  Potential side effects and risks were discussed as above.  Encounter for follow-up surveillance of prostate cancer - Plan: PSA  -Previous PSAs reassuring, recheck.  Needs flu shot - Plan: Flu Vaccine QUAD 6+ mos PF IM (Fluarix Quad PF)   Meds ordered this encounter  Medications  . allopurinol (ZYLOPRIM) 300 MG tablet    Sig: Take 1 tablet (300 mg total) by mouth daily.    Dispense:  90 tablet    Refill:  3  . HYDROcodone-acetaminophen (NORCO/VICODIN) 5-325 MG tablet    Sig: Take 1 tablet by mouth every 6 (six) hours as needed for moderate pain. PRN    Dispense:  30 tablet    Refill:  0  . cyclobenzaprine (FLEXERIL) 10 MG tablet  Sig: Take 1 tablet (10 mg total) by mouth 3 (three) times daily.    Dispense:  42 tablet    Refill:  3   Patient  Instructions    No change in meds today. Keep up the good work with weight loss. Let me know if there are questions and take care.     If you have lab work done today you will be contacted with your lab results within the next 2 weeks.  If you have not heard from [place] then please contact us. The fastest way to get your results is to register for My Chart.   IF you received an x-ray today, you will receive an invoice from [facility]. Please contact [facility] at [phone] with questions or concerns regarding your invoice.   IF you received labwork today, you will receive an invoice from [place]. Please contact [facility] at [phone] with questions or concerns regarding your invoice.   Our billing staff will not be able to assist you with questions regarding Cunningham from these companies.  You will be contacted with the lab results as soon as they are available. The fastest way to get your results is to activate your My Chart account. Instructions are located on the last page of this paperwork. If you have not heard from [place] regarding the results in 2 weeks, please contact this office.         Signed, Merri Ray, MD [facility]

## 2020-01-26 LAB — LIPID PANEL
Chol/HDL Ratio: 5.1 ratio — ABNORMAL HIGH (ref 0.0–5.0)
Cholesterol, Total: 194 mg/dL (ref 100–199)
HDL: 38 mg/dL — ABNORMAL LOW (ref >39)
LDL Chol Calc (NIH): 113 mg/dL — ABNORMAL HIGH (ref 0–99)
Triglycerides: 247 mg/dL — ABNORMAL HIGH (ref 0–149)
VLDL Cholesterol Cal: 43 mg/dL — ABNORMAL HIGH (ref 5–40)

## 2020-01-26 LAB — COMPREHENSIVE METABOLIC PANEL
ALT: 17 IU/L (ref 0–44)
AST: 21 IU/L (ref 0–40)
Albumin/Globulin Ratio: 1.8 (ref 1.2–2.2)
Albumin: 4.6 g/dL (ref 3.8–4.9)
Alkaline Phosphatase: 62 IU/L (ref 44–121)
BUN/Creatinine Ratio: 14 (ref 10–24)
BUN: 13 mg/dL (ref 8–27)
Bilirubin Total: 0.7 mg/dL (ref 0.0–1.2)
CO2: 26 mmol/L (ref 20–29)
Calcium: 9.2 mg/dL (ref 8.6–10.2)
Chloride: 103 mmol/L (ref 96–106)
Creatinine, Ser: 0.9 mg/dL (ref 0.76–1.27)
GFR calc Af Amer: 107 mL/min/{1.73_m2} (ref >59)
GFR calc non Af Amer: 93 mL/min/{1.73_m2} (ref >59)
Globulin, Total: 2.6 g/dL (ref 1.5–4.5)
Glucose: 98 mg/dL (ref 65–99)
Potassium: 3.9 mmol/L (ref 3.5–5.2)
Sodium: 142 mmol/L (ref 134–144)
Total Protein: 7.2 g/dL (ref 6.0–8.5)

## 2020-01-26 LAB — PSA: Prostate Specific Ag, Serum: <0.1 ng/mL (ref 0.0–4.0)

## 2020-01-26 LAB — HEMOGLOBIN A1C
Est. average glucose Bld gHb Est-mCnc: 108 mg/dL
Hgb A1c MFr Bld: 5.4 % (ref 4.8–5.6)

## 2020-02-15 ENCOUNTER — Other Ambulatory Visit: Payer: Self-pay

## 2020-02-15 DIAGNOSIS — Z20822 Contact with and (suspected) exposure to covid-19: Secondary | ICD-10-CM | POA: Diagnosis not present

## 2020-02-16 LAB — SARS-COV-2, NAA 2 DAY TAT

## 2020-02-16 LAB — NOVEL CORONAVIRUS, NAA: SARS-CoV-2, NAA: NOT DETECTED

## 2020-02-22 DIAGNOSIS — H2513 Age-related nuclear cataract, bilateral: Secondary | ICD-10-CM | POA: Diagnosis not present

## 2020-02-22 DIAGNOSIS — H524 Presbyopia: Secondary | ICD-10-CM | POA: Diagnosis not present

## 2020-02-22 DIAGNOSIS — H52203 Unspecified astigmatism, bilateral: Secondary | ICD-10-CM | POA: Diagnosis not present

## 2020-02-22 DIAGNOSIS — H5203 Hypermetropia, bilateral: Secondary | ICD-10-CM | POA: Diagnosis not present

## 2020-02-28 ENCOUNTER — Other Ambulatory Visit: Payer: Self-pay | Admitting: Family Medicine

## 2020-02-28 ENCOUNTER — Other Ambulatory Visit: Payer: Self-pay

## 2020-02-28 ENCOUNTER — Ambulatory Visit (INDEPENDENT_AMBULATORY_CARE_PROVIDER_SITE_OTHER): Payer: 59 | Admitting: Family Medicine

## 2020-02-28 ENCOUNTER — Encounter: Payer: Self-pay | Admitting: Family Medicine

## 2020-02-28 VITALS — BP 134/88 | HR 77 | Temp 97.6°F | Ht 72.0 in | Wt 245.0 lb

## 2020-02-28 DIAGNOSIS — R39198 Other difficulties with micturition: Secondary | ICD-10-CM

## 2020-02-28 DIAGNOSIS — N5089 Other specified disorders of the male genital organs: Secondary | ICD-10-CM

## 2020-02-28 DIAGNOSIS — R82998 Other abnormal findings in urine: Secondary | ICD-10-CM | POA: Diagnosis not present

## 2020-02-28 DIAGNOSIS — R109 Unspecified abdominal pain: Secondary | ICD-10-CM

## 2020-02-28 LAB — POCT URINALYSIS DIP (MANUAL ENTRY)
Bilirubin, UA: NEGATIVE
Blood, UA: NEGATIVE
Glucose, UA: NEGATIVE mg/dL
Ketones, POC UA: NEGATIVE mg/dL
Leukocytes, UA: NEGATIVE
Nitrite, UA: NEGATIVE
Protein Ur, POC: NEGATIVE mg/dL
Spec Grav, UA: 1.025 (ref 1.010–1.025)
Urobilinogen, UA: 0.2 E.U./dL
pH, UA: 7 (ref 5.0–8.0)

## 2020-02-28 LAB — POC MICROSCOPIC URINALYSIS (UMFC): Mucus: ABSENT

## 2020-02-28 MED ORDER — TAMSULOSIN HCL 0.4 MG PO CAPS: 0.4000 mg | ORAL_CAPSULE | Freq: Every day | ORAL | 0 refills | Status: DC

## 2020-02-28 MED FILL — TAMSULOSIN HCL 0.4 MG CAP: 0.4 | 30 days supply | Qty: 30 | Fill #0

## 2020-02-28 MED FILL — AMOXICILLIN 500 MG CAPSULE: 500 | 10 days supply | Qty: 30 | Fill #0

## 2020-02-28 NOTE — Progress Notes (Signed)
Subjective:  Patient ID: Adam Cunningham., male    DOB: 04-15-1960  Age: 60 y.o. MRN: [id number]  CC:  Chief Complaint  Patient presents with  . Back Pain    Pt reports back pain and trouble urinating. Pt  reports the pain in his back is on both his L and right side near the kidney areas. Pt reports urinating and feeling like his bladder isn't fully empty. Pt also reports pain while urinating. Pt reports he did have dark urine for a little while, but not any more.  . Mass    Pt reports a bump on his R testical. Pt reports no pain or iritation that he has noticed. PT reports he first noticed the bump about a week ago.    HPI Adam Cunningham. presents for   Concerns above  Mid back pain: Bilateral near kidney area. Past month. Noticed after last physical . No injury, fall. No new activities.  Noticed with sensation of incomplete emptying of bladder past few weeks. .  Some dark urine now improved, no dysuria. No visible hematuria. Quantity of urination has improved past few weeks. Some discomfort in groin after, but that has improved some as well.still feels incomplete emptying.  No prior kidney stones. Hx of prostatectomy for prostate CA in May 2009. Lab Results  Component Value Date   PSA1 <0.1 01/25/2020   PSA1 <0.1 01/08/2019   PSA1 <0.1 01/06/2018   PSA <0.01 12/26/2014   PSA <0.01 12/14/2013   PSA <0.01 12/08/2012   No tenesmus, no bowel changes.  No fever/n/v/abd pain.  Look leftover rapaflo -slight improvement.  No recent urology eval.   History of chronic low back pain with intermittent flares, mild to moderate spondylosis on imaging in August 2021. current pain/discomfort is higher. Wore at night.   Right testicular bump. noticed 1 week ago. No pain, just noticed. No change in size past week. No penile discharge. No extramarital issues.   History Patient Active Problem List   Diagnosis Date Noted  . Hyperlipidemia 11/26/2011  . Gout 11/26/2011  .  Low testosterone 11/26/2011  . H/O prostate cancer 05/26/2007   Past Medical History:  Diagnosis Date  . Cancer [facility])    prostate  . Gout    Past Surgical History:  Procedure Laterality Date  . BICEPS TENDON REPAIR    . PROSTATE SURGERY    . SHOULDER OPEN ROTATOR CUFF REPAIR Left 04/29/2019   Procedure: OPEN ROTATOR CUFF REPAIR LEFT SHOULDER;  Surgeon: Carole Civil, MD;  Location: [facility];  Service: Orthopedics;  Laterality: Left;  and interscalene block    No Known Allergies Prior to Admission medications   Medication Sig Start Date End Date Taking? Authorizing Provider  allopurinol (ZYLOPRIM) 300 MG tablet Take 1 tablet (300 mg total) by mouth daily. 01/25/20  Yes [name], MD  colchicine 0.6 MG tablet Take 1 tablet (0.6 mg total) by mouth daily as needed (gout flare ups). 09/06/19  Yes [name], MD  cyclobenzaprine (FLEXERIL) 10 MG tablet Take 1 tablet (10 mg total) by mouth 3 (three) times daily. 01/25/20  Yes [name], MD  HYDROcodone-acetaminophen (NORCO/VICODIN) 5-325 MG tablet Take 1 tablet by mouth every 6 (six) hours as needed for moderate pain. PRN 01/25/20  Yes [name], MD  ibuprofen (ADVIL) 800 MG tablet Take 1 tablet (800 mg total) by mouth every 8 (eight) hours as needed. 04/29/19  Yes Carole Civil, MD  indomethacin (  INDOCIN) 50 MG capsule Take 1 capsule (50 mg total) by mouth 2 (two) times daily as needed (gout pain.). 09/06/19  Yes [name], MD  tadalafil (CIALIS) 20 MG tablet Take 0.5-1 tablets (10-20 mg total) by mouth every other day as needed for erectile dysfunction. 09/06/19  Yes SagardiaInes Bloomer, MD   Social History   Socioeconomic History  . Marital status: Married    Spouse name: Not on file  . Number of children: Not on file  . Years of education: Not on file  . Highest education level: Not on file  Occupational History  . 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 Occupation: Airline pilot  Tobacco Use  . Smoking  status: Never Smoker  . Smokeless tobacco: Never Used  Vaping Use  . Vaping Use: Never used  Substance and Sexual Activity  . Alcohol use: No  . Drug use: No  . Sexual activity: Yes  Other Topics Concern  . 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  Constitutional:      General: He is not in acute distress.    Appearance: Normal appearance. He is well-developed and well-nourished.  HENT:     Head: Normocephalic and atraumatic.  Cardiovascular:     Rate and Rhythm: Normal rate.  Pulmonary:     Effort: Pulmonary effort is normal.  Abdominal:     General: There is no distension.     Tenderness: There is no abdominal tenderness. There is no right CVA tenderness, left CVA tenderness or guarding.  Genitourinary:    Comments: No inguinal hernia appreciated.  Right testicle posterior aspect approximately 1 cm firm nodular area, nontender.  Near epididymis no rash, no penile discharge.  Musculoskeletal:     Comments: No CVA tenderness, minimal discomfort in the area with left lateral flexion, right lateral flexion.  No midline bony tenderness.  Neurological:     Mental Status: He is alert and oriented to person, place, and time.  Psychiatric:        Mood and Affect: Mood and affect and mood normal.        Behavior: Behavior normal.      Results for orders placed or  performed in visit on 02/28/20  POCT urinalysis dipstick  Result Value Ref Range   Color, UA yellow yellow   Clarity, UA clear clear   Glucose, UA negative negative mg/dL   Bilirubin, UA negative negative   Ketones, POC UA negative negative mg/dL   Spec Grav, UA 1.025 1.010 - 1.025   Blood, UA negative negative   pH, UA 7.0 5.0 - 8.0   Protein Ur, POC negative negative mg/dL   Urobilinogen, UA 0.2 0.2 or 1.0 E.U./dL   Nitrite, UA Negative Negative  Leukocytes, UA Negative Negative  POCT Microscopic Urinalysis ([facility])  Result Value Ref Range   WBC,UR,HPF,POC None None WBC/hpf   RBC,UR,HPF,POC None None RBC/hpf   Bacteria None None, Too numerous to count   Mucus Absent Absent   Epithelial Cells, UR Per Microscopy None None, Too numerous to count cells/hpf   34 minutes spent during visit, greater than 50% counseling and assimilation of information, chart review, and discussion of plan.   Assessment & Plan:  Tylik Treese. is a 60 y.o. male . Abdominal pain, unspecified abdominal location - Plan: CT RENAL STONE STUDY  Difficulty urinating - Plan: POCT urinalysis dipstick, POCT Microscopic Urinalysis ([facility]), CT RENAL STONE STUDY  Dark urine - Plan: Comprehensive metabolic panel, CT RENAL STONE STUDY  Flank pain - Plan: Comprehensive metabolic panel, CT RENAL STONE STUDY  Mass of right testicle - Plan: US Scrotum   Reassuring in office urinalysis.  Prior prostatectomy.  Initial dark urine, urinary symptoms concerning for possible nephrolithiasis and potentially obstruction.  Will check CT renal stone study, check CMP to evaluate creatinine as well as LFTs with previous dark urine.  Overall has had some improvement in symptoms.  We will try low-dose Flomax for now as well.  Potential side effects discussed.  RTC/ER precautions.  Depending on results may need follow-up with his urologist.  Firm nodular area on right testicle, nontender.  Start with scrotal ultrasound, but  likely will need follow-up with his urologist as well as for symptoms as above.  No orders of the defined types were placed in this encounter.  Patient Instructions       If you have lab work done today you will be contacted with your lab results within the next 2 weeks.  If you have not heard from [place] then please contact us. The fastest way to get your results is to register for My Chart.   IF you received an x-ray today, you will receive an invoice from [facility]. Please contact [facility]iology at [phone] with questions or concerns regarding your invoice.   IF you received labwork today, you will receive an invoice from [place]. Please contact [facility] at [phone] with questions or concerns regarding your invoice.   Our billing staff will not be able to assist you with questions regarding bills from these companies.  You will be contacted with the lab results as soon as they are available. The fastest way to get your results is to activate your My Chart account. Instructions are located on the last page of this paperwork. If you have not heard from [place] regarding the results in 2 weeks, please contact this office.         Signed, Merri Ray, MD [facility]

## 2020-02-28 NOTE — Patient Instructions (Addendum)
  Start Flomax once per day.  I will check CT to evaluate for kidney stones as well as electrolytes including kidney function and liver test today.  Ultrasound was also ordered to evaluate the bump on the right testicle.  Depending on results can have you follow-up with urology, but if any worsening symptoms I do recommend being seen here or through your urologist.  Let me know if there are questions.    If you have lab work done today you will be contacted with your lab results within the next 2 weeks.  If you have not heard from [place] then please contact us. The fastest way to get your results is to register for My Chart.   IF you received an x-ray today, you will receive an invoice from [facility]. Please contact [facility]iology at [phone] with questions or concerns regarding your invoice.   IF you received labwork today, you will receive an invoice from [place]. Please contact [facility] at [phone] with questions or concerns regarding your invoice.   Our billing staff will not be able to assist you with questions regarding bills from these companies.  You will be contacted with the lab results as soon as they are available. The fastest way to get your results is to activate your My Chart account. Instructions are located on the last page of this paperwork. If you have not heard from [place] regarding the results in 2 weeks, please contact this office.

## 2020-02-29 ENCOUNTER — Other Ambulatory Visit: Payer: Self-pay | Admitting: Family Medicine

## 2020-02-29 ENCOUNTER — Encounter: Payer: Self-pay | Admitting: Family Medicine

## 2020-02-29 ENCOUNTER — Telehealth: Payer: Self-pay | Admitting: Family Medicine

## 2020-02-29 DIAGNOSIS — N5089 Other specified disorders of the male genital organs: Secondary | ICD-10-CM

## 2020-02-29 LAB — COMPREHENSIVE METABOLIC PANEL
ALT: 18 IU/L (ref 0–44)
AST: 16 IU/L (ref 0–40)
Albumin/Globulin Ratio: 1.9 (ref 1.2–2.2)
Albumin: 4.5 g/dL (ref 3.8–4.9)
Alkaline Phosphatase: 73 IU/L (ref 44–121)
BUN/Creatinine Ratio: 16 (ref 10–24)
BUN: 14 mg/dL (ref 8–27)
Bilirubin Total: 0.3 mg/dL (ref 0.0–1.2)
CO2: 26 mmol/L (ref 20–29)
Calcium: 9.2 mg/dL (ref 8.6–10.2)
Chloride: 102 mmol/L (ref 96–106)
Creatinine, Ser: 0.85 mg/dL (ref 0.76–1.27)
GFR calc Af Amer: 109 mL/min/{1.73_m2} (ref >59)
GFR calc non Af Amer: 95 mL/min/{1.73_m2} (ref >59)
Globulin, Total: 2.4 g/dL (ref 1.5–4.5)
Glucose: 99 mg/dL (ref 65–99)
Potassium: 4 mmol/L (ref 3.5–5.2)
Sodium: 142 mmol/L (ref 134–144)
Total Protein: 6.9 g/dL (ref 6.0–8.5)

## 2020-02-29 NOTE — Telephone Encounter (Signed)
Received another call from [facility] requesting clarity of order. Please clarify if ultrasound needs doppler asap. Please advise at [phone], ext [phone]

## 2020-02-29 NOTE — Telephone Encounter (Signed)
Received call from [facility] at [place]. Please clarify whether stat ultrasound should be with doppler. Please advise at [phone].

## 2020-03-01 ENCOUNTER — Ambulatory Visit
Admission: RE | Admit: 2020-03-01 | Discharge: 2020-03-01 | Disposition: A | Discharge status: Home / Self Care | Payer: 59 | Source: Ambulatory Visit | Attending: Family Medicine | Admitting: Family Medicine

## 2020-03-01 ENCOUNTER — Other Ambulatory Visit: Payer: Self-pay

## 2020-03-01 DIAGNOSIS — N503 Cyst of epididymis: Secondary | ICD-10-CM | POA: Diagnosis not present

## 2020-03-01 DIAGNOSIS — R109 Unspecified abdominal pain: Secondary | ICD-10-CM

## 2020-03-01 DIAGNOSIS — R39198 Other difficulties with micturition: Secondary | ICD-10-CM

## 2020-03-01 DIAGNOSIS — N5089 Other specified disorders of the male genital organs: Secondary | ICD-10-CM

## 2020-03-01 DIAGNOSIS — R82998 Other abnormal findings in urine: Secondary | ICD-10-CM

## 2020-03-01 DIAGNOSIS — R10A0 Flank pain, unspecified side: Secondary | ICD-10-CM

## 2020-03-01 NOTE — Telephone Encounter (Signed)
Called Adam Cunningham back and she has confirmed that the order is with the doppler and pt is scheduled for today at 2:50pm.

## 2020-03-02 ENCOUNTER — Other Ambulatory Visit: Payer: Self-pay | Admitting: Family Medicine

## 2020-03-02 DIAGNOSIS — R39198 Other difficulties with micturition: Secondary | ICD-10-CM

## 2020-03-02 DIAGNOSIS — N5089 Other specified disorders of the male genital organs: Secondary | ICD-10-CM

## 2020-03-02 NOTE — Progress Notes (Signed)
See ultrasound results. Will refer to urology to review results and decision

## 2020-03-27 ENCOUNTER — Encounter: Payer: Self-pay | Admitting: Family Medicine

## 2020-03-27 MED FILL — TADALAFIL 20 MG TABS: 20 | 30 days supply | Qty: 5 | Fill #2

## 2020-03-27 MED FILL — ALLOPURINOL 300 MG TABS: 300 | 90 days supply | Qty: 90 | Fill #0

## 2020-04-04 ENCOUNTER — Other Ambulatory Visit (HOSPITAL_COMMUNITY): Payer: Self-pay | Admitting: Urology

## 2020-04-04 DIAGNOSIS — N451 Epididymitis: Secondary | ICD-10-CM | POA: Diagnosis not present

## 2020-04-04 DIAGNOSIS — N4340 Spermatocele of epididymis, unspecified: Secondary | ICD-10-CM | POA: Diagnosis not present

## 2020-04-04 DIAGNOSIS — C61 Malignant neoplasm of prostate: Secondary | ICD-10-CM | POA: Diagnosis not present

## 2020-05-24 ENCOUNTER — Other Ambulatory Visit (HOSPITAL_COMMUNITY): Payer: Self-pay

## 2020-06-29 ENCOUNTER — Other Ambulatory Visit: Payer: Self-pay | Admitting: Urology

## 2020-06-29 DIAGNOSIS — N503 Cyst of epididymis: Secondary | ICD-10-CM

## 2020-07-13 MED FILL — Allopurinol Tab 300 MG: ORAL | 90 days supply | Qty: 90 | Fill #0 | Status: AC

## 2020-07-13 MED FILL — Cyclobenzaprine HCl Tab 10 MG: ORAL | 14 days supply | Qty: 42 | Fill #0 | Status: AC

## 2020-07-14 ENCOUNTER — Other Ambulatory Visit (HOSPITAL_COMMUNITY): Payer: Self-pay

## 2020-07-17 ENCOUNTER — Other Ambulatory Visit (HOSPITAL_COMMUNITY): Payer: Self-pay

## 2020-07-17 ENCOUNTER — Ambulatory Visit
Admission: RE | Admit: 2020-07-17 | Discharge: 2020-07-17 | Disposition: A | Discharge status: Home / Self Care | Payer: 59 | Source: Ambulatory Visit | Attending: Urology | Admitting: Urology

## 2020-07-17 ENCOUNTER — Other Ambulatory Visit: Payer: Self-pay

## 2020-07-17 DIAGNOSIS — N503 Cyst of epididymis: Secondary | ICD-10-CM | POA: Diagnosis not present

## 2020-07-17 MED FILL — Tadalafil Tab 20 MG: ORAL | 30 days supply | Qty: 5 | Fill #0 | Status: AC

## 2020-07-19 ENCOUNTER — Other Ambulatory Visit (HOSPITAL_COMMUNITY): Payer: Self-pay

## 2020-07-27 DIAGNOSIS — Z8546 Personal history of malignant neoplasm of prostate: Secondary | ICD-10-CM | POA: Diagnosis not present

## 2020-07-27 DIAGNOSIS — N4340 Spermatocele of epididymis, unspecified: Secondary | ICD-10-CM | POA: Diagnosis not present

## 2020-08-08 NOTE — Telephone Encounter (Signed)
error 

## 2020-10-02 ENCOUNTER — Other Ambulatory Visit (HOSPITAL_COMMUNITY): Payer: Self-pay

## 2020-10-02 MED ORDER — PREDNISONE 10 MG (48) PO TBPK
ORAL_TABLET | Freq: Every day | ORAL | 0 refills | Status: DC
Start: 2020-10-02 — End: 2020-10-12
  Filled 2020-10-02: qty 48, 12d supply, fill #0

## 2020-10-11 ENCOUNTER — Encounter: Payer: Self-pay | Admitting: Family Medicine

## 2020-10-12 ENCOUNTER — Other Ambulatory Visit (HOSPITAL_COMMUNITY): Payer: Self-pay

## 2020-10-12 ENCOUNTER — Telehealth (INDEPENDENT_AMBULATORY_CARE_PROVIDER_SITE_OTHER): Payer: 59 | Admitting: Family Medicine

## 2020-10-12 DIAGNOSIS — U071 COVID-19: Secondary | ICD-10-CM | POA: Diagnosis not present

## 2020-10-12 DIAGNOSIS — R059 Cough, unspecified: Secondary | ICD-10-CM | POA: Diagnosis not present

## 2020-10-12 MED ORDER — BENZONATATE 100 MG PO CAPS
100.0000 mg | ORAL_CAPSULE | Freq: Three times a day (TID) | ORAL | 0 refills | Status: DC | PRN
  Filled 2020-10-12: qty 20, 7d supply, fill #0

## 2020-10-12 MED ORDER — NIRMATRELVIR/RITONAVIR (PAXLOVID)TABLET
3.0000 | ORAL_TABLET | Freq: Two times a day (BID) | ORAL | 0 refills | Status: AC
  Filled 2020-10-12: qty 30, 5d supply, fill #0

## 2020-10-12 NOTE — Progress Notes (Signed)
Virtual Visit via Video Note  I connected with [name]. on 10/12/20 at 9:24 AM by a video enabled telemedicine application and verified that I am speaking with the correct person using two identifiers.  Patient location:home, by self.  My location: office - [place].    I discussed the limitations, risks, security and privacy concerns of performing an evaluation and management service by telephone and the availability of in person appointments. I also discussed with the patient that there may be a patient responsible charge related to this service. The patient expressed understanding and agreed to proceed, consent obtained  Chief complaint:  Chief Complaint  Patient presents with   Covid Positive    Tuesday tested positive sxs starting Sunday nights, pt notes congestion, cough very productive, sore throat, pain with swallowing, light headed, no fatigue     History of Present Illness: Adam Cunningham. is a 60 y.o. male  Covid 87 infection Initial symptoms started early Monday morning in the middle of the night - 3 days ago. Initial sore throat. Initial negative test, then tested positive 2 days ago.  Cough, sore throat, pain with swallowing. Cough has been main symptoms now. Productive of clear phlegm.  No fever known.  Denies chest pain, disorientation/confusion, shortness of breath at rest or difficulty with drinking fluids. Tx: mucinex, sudafed - min relief.   COVID risk of complication score of 3.  Initial COVID-vaccine January, February 2021, booster in December 2021. No recent booster. Discussed antiviral.   eGFR 95 in 02/28/20.   Patient Active Problem List   Diagnosis Date Noted   Hyperlipidemia 11/26/2011   Gout 11/26/2011   Low testosterone 11/26/2011   H/O prostate cancer 05/26/2007   Past Medical History:  Diagnosis Date   Cancer [facility])    prostate   Gout    Past Surgical History:  Procedure Laterality Date   BICEPS TENDON REPAIR      PROSTATE SURGERY     SHOULDER OPEN ROTATOR CUFF REPAIR Left 04/29/2019   Procedure: OPEN ROTATOR CUFF REPAIR LEFT SHOULDER;  Surgeon: Carole Civil, MD;  Location: [facility];  Service: Orthopedics;  Laterality: Left;  and interscalene block    Not on File Prior to Admission medications   Medication Sig Start Date End Date Taking? Authorizing Provider  allopurinol (ZYLOPRIM) 300 MG tablet TAKE 1 TABLET BY MOUTH ONCE A DAY 01/25/20 01/24/21 Yes [name], MD  amoxicillin (AMOXIL) 500 MG capsule TAKE 1 CAPSULE BY MOUTH 3 TIMES DAILY FOR 10 DAYS 02/28/20 02/27/21 Yes   colchicine 0.6 MG tablet Take 1 tablet (0.6 mg total) by mouth daily as needed (gout flare ups). 09/06/19  Yes [name], MD  cyclobenzaprine (FLEXERIL) 10 MG tablet TAKE 1 TABLET BY MOUTH 3 TIMES DAILY 01/25/20 01/24/21 Yes [name], MD  ibuprofen (ADVIL) 800 MG tablet Take 1 tablet (800 mg total) by mouth every 8 (eight) hours as needed. 04/29/19  Yes Carole Civil, MD  indomethacin (INDOCIN) 50 MG capsule Take 1 capsule (50 mg total) by mouth 2 (two) times daily as needed (gout pain.). 09/06/19  Yes [name], MD  levofloxacin (LEVAQUIN) 500 MG tablet TAKE 1 TABLET BY MOUTH TWICE DAILY 04/04/20 04/04/21 Yes Janith Lima, MD  predniSONE (STERAPRED UNI-PAK 48 TAB) 10 MG (48) TBPK tablet Take as directed by mouth daily. 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   Social research officer, government   Education: [employer]   Exercise: Yes   Social Determinants of Radio broadcast assistant Strain: Not on file  Food Insecurity: Not on file  Transportation Needs: Not on file  Physical Activity: Not on file  Stress: Not on file  Social Connections: Not on file  Intimate Partner Violence: Not on file    Observations/Objective: There were no vitals filed for this visit. Nontoxic appearance on video, speaking in full sentences, no respiratory distress, few episodes of coughing but brief during visit.  Coherent responses, appropriate responses.  Understanding of plan expressed with all questions answered.  Assessment and Plan: COVID-19 virus infection - Plan: nirmatrelvir/ritonavir EUA (PAXLOVID) 20 x 150 MG & 10 x 100MG TABS  Cough - Plan: benzonatate (TESSALON) 100 MG capsule COVID-19 infection, currently day 3.  No red flags based on history or video exam.  Treatment discussed including Mucinex DM for cough, add Tessalon Perles if needed, fluids, rest.  Antivirals discussed, including potential risks and benefits, and he would like to start that medication.  Renal function normal, start Paxlovid.  Avoid colchicine as well as Cialis during course of that medication.  Possibility of rebound COVID and subsequent treatment/isolation precautions discussed if that were to occur.  Isolation/masking guidelines discussed by CDC recommendations and handout given.  Follow Up Instructions: ER precautions given.    I discussed the assessment and treatment plan with the patient. The patient was provided an opportunity to ask questions and all were answered. The patient agreed with the plan and demonstrated an  understanding of the instructions.   The patient was advised to call back or seek an in-person evaluation if the symptoms worsen or if the condition fails to improve as anticipated.  [name], MD

## 2020-10-12 NOTE — Patient Instructions (Addendum)
Sorry to hear that you are sick. Continue mucinex or mucinex DM for the cough, or can try the tessalon perles.  Start paxlovid.  No not take colchicine or Cialis when taking antiviral.  If shortness of breath at rest, chest pains, difficulty with drinking fluids, or any acute worsening symptoms be seen in ER.   Your employer may have specific requirements for return to work, but this information is provided from the CDC. There is a link provided below for more information if needed.   Everyone who has presumed or confirmed COVID-19 should stay home and isolate from other people for at least 5 full days (day 0 is the first day of symptoms or the date of the day of the positive viral test for asymptomatic persons). You can end isolation after 5 full days if you are fever-free for 24 hours without the use of fever-reducing medication and your other symptoms have improved (Loss of taste and smell may persist for weeks or months after recovery and need not delay the end of isolation). You should continue to wear a well-fitting mask around others at home and in public for 5 additional days (day 6 through day 10) after the end of your 5-day isolation period. If you are unable to wear a mask when around others, you should continue to isolate for a full 10 days. Avoid people who have weakened immune systems or are more likely to get very sick from COVID-19, and nursing homes and other high-risk settings, until after at least 10 days.  If you continue to have fever or your other symptoms have not improved after 5 days of isolation, you should wait to end your isolation until you are fever-free for 24 hours without the use of fever-reducing medication and your other symptoms have improved. Continue to wear a well-fitting mask through day 10.   https://brown.org/.html

## 2020-10-23 ENCOUNTER — Other Ambulatory Visit (HOSPITAL_COMMUNITY): Payer: Self-pay

## 2020-10-23 MED FILL — Allopurinol Tab 300 MG: ORAL | 90 days supply | Qty: 90 | Fill #1 | Status: AC

## 2020-11-23 ENCOUNTER — Telehealth: Payer: Self-pay

## 2020-11-23 DIAGNOSIS — N529 Male erectile dysfunction, unspecified: Secondary | ICD-10-CM

## 2020-11-23 NOTE — Telephone Encounter (Signed)
Caller name:[facility] [place] callback #[phone]  Encourage patient to contact the pharmacy for refills or they can request refills through [facility]  (Please schedule appointment if patient has not been seen in over a year)  MEDICATION NAME & DOSE:tadalafil (CIALIS) 20 MG tablet   Notes/Comments from patient:  [place] TO: [place]  [address], [place] [address]   Please notify patient: It takes 48-72 hours to process rx refill requests Ask patient to call pharmacy to ensure rx is ready before heading there.   (CLINICAL TO FILL OR ROUTE PER PROTOCOLS)

## 2020-11-24 ENCOUNTER — Other Ambulatory Visit (HOSPITAL_COMMUNITY): Payer: Self-pay

## 2020-11-24 ENCOUNTER — Ambulatory Visit (INDEPENDENT_AMBULATORY_CARE_PROVIDER_SITE_OTHER): Payer: 59 | Admitting: Family Medicine

## 2020-11-24 ENCOUNTER — Other Ambulatory Visit: Payer: Self-pay

## 2020-11-24 DIAGNOSIS — Z23 Encounter for immunization: Secondary | ICD-10-CM

## 2020-11-24 MED ORDER — TADALAFIL 20 MG PO TABS
ORAL_TABLET | ORAL | 11 refills | Status: DC
  Filled 2020-11-24: qty 5, 30d supply, fill #0
  Filled 2021-02-14: qty 5, 30d supply, fill #1
  Filled 2021-06-08: qty 5, 30d supply, fill #2
  Filled 2021-08-16: qty 5, 30d supply, fill #3
  Filled 2021-10-15: qty 5, 30d supply, fill #4
  Filled 2021-11-23: qty 5, 30d supply, fill #5

## 2020-11-24 NOTE — Progress Notes (Signed)
Flu shot administered with no issues.

## 2020-11-24 NOTE — Telephone Encounter (Signed)
Patient would like a refill on Cialis 20mg . Dr Horald Pollen was the last provider to prescribe. Please advise

## 2020-12-07 DIAGNOSIS — Z23 Encounter for immunization: Secondary | ICD-10-CM | POA: Diagnosis not present

## 2021-01-25 ENCOUNTER — Encounter: Payer: Self-pay | Admitting: Family Medicine

## 2021-01-25 ENCOUNTER — Other Ambulatory Visit (HOSPITAL_COMMUNITY): Payer: Self-pay

## 2021-01-25 ENCOUNTER — Ambulatory Visit (INDEPENDENT_AMBULATORY_CARE_PROVIDER_SITE_OTHER): Payer: No Typology Code available for payment source | Admitting: Family Medicine

## 2021-01-25 VITALS — BP 130/78 | HR 71 | Temp 98.1°F | Resp 16 | Ht 72.0 in | Wt 253.4 lb

## 2021-01-25 DIAGNOSIS — M109 Gout, unspecified: Secondary | ICD-10-CM

## 2021-01-25 DIAGNOSIS — E782 Mixed hyperlipidemia: Secondary | ICD-10-CM | POA: Diagnosis not present

## 2021-01-25 DIAGNOSIS — Z0000 Encounter for general adult medical examination without abnormal findings: Secondary | ICD-10-CM

## 2021-01-25 DIAGNOSIS — Z8546 Personal history of malignant neoplasm of prostate: Secondary | ICD-10-CM | POA: Diagnosis not present

## 2021-01-25 DIAGNOSIS — Z130 Encounter for screening for diseases of the blood and blood-forming organs and certain disorders involving the immune mechanism: Secondary | ICD-10-CM | POA: Diagnosis not present

## 2021-01-25 DIAGNOSIS — Z131 Encounter for screening for diabetes mellitus: Secondary | ICD-10-CM | POA: Diagnosis not present

## 2021-01-25 DIAGNOSIS — E669 Obesity, unspecified: Secondary | ICD-10-CM

## 2021-01-25 MED ORDER — ALLOPURINOL 300 MG PO TABS
ORAL_TABLET | Freq: Every day | ORAL | 3 refills | Status: DC
  Filled 2021-01-25: qty 90, 90d supply, fill #0
  Filled 2021-06-08: qty 90, 90d supply, fill #1
  Filled 2021-09-11: qty 90, 90d supply, fill #2
  Filled 2021-11-23: qty 90, 90d supply, fill #3

## 2021-01-25 NOTE — Patient Instructions (Addendum)
I will check some labs including cholesterol levels today.  If borderline ASCVD risk score, can consider continued diet/exercise approach or intermittent dosing of a statin.  Can discuss further once I see results.  Coronary calcium scoring is also an option to help decide on statin medication.  Again we can discuss this further once I see labs.  Try to incorporate some activity/exercise most days per week with goal of 150 minutes/week.  Continue to watch diet, portions, avoid sugar containing beverages and late-night meals.  If difficulty with weight loss, follow-up and we can discuss next steps or possible prescription meds or treatments.  Thanks for coming in today and let me know if there are questions.  Preventive Care 18-72 Years Old, Male Preventive care refers to lifestyle choices and visits with your health care provider that can promote health and wellness. Preventive care visits are also called wellness exams. What can I expect for my preventive care visit? Counseling During your preventive care visit, your health care provider may ask about your: Medical history, including: Past medical problems. Family medical history. Current health, including: Emotional well-being. Home life and relationship well-being. Sexual activity. Lifestyle, including: Alcohol, nicotine or tobacco, and drug use. Access to firearms. Diet, exercise, and sleep habits. Safety issues such as seatbelt and bike helmet use. Sunscreen use. Work and work Statistician. Physical exam Your health care provider will check your: Height and weight. These may be used to calculate your BMI (body mass index). BMI is a measurement that tells if you are at a healthy weight. Waist circumference. This measures the distance around your waistline. This measurement also tells if you are at a healthy weight and may help predict your risk of certain diseases, such as type 2 diabetes and high blood pressure. Heart rate and blood  pressure. Body temperature. Skin for abnormal spots. What immunizations do I need? Vaccines are usually given at various ages, according to a schedule. Your health care provider will recommend vaccines for you based on your age, medical history, and lifestyle or other factors, such as travel or where you work. What tests do I need? Screening Your health care provider may recommend screening tests for certain conditions. This may include: Lipid and cholesterol levels. Diabetes screening. This is done by checking your blood sugar (glucose) after you have not eaten for a while (fasting). Hepatitis B test. Hepatitis C test. HIV (human immunodeficiency virus) test. STI (sexually transmitted infection) testing, if you are at risk. Lung cancer screening. Prostate cancer screening. Colorectal cancer screening. Talk with your health care provider about your test results, treatment options, and if necessary, the need for more tests. Follow these instructions at home: Eating and drinking  Eat a diet that includes fresh fruits and vegetables, whole grains, lean protein, and low-fat dairy products. Take vitamin and mineral supplements as recommended by your health care provider. Do not drink alcohol if your health care provider tells you not to drink. If you drink alcohol: Limit how much you have to 0-2 drinks a day. Know how much alcohol is in your drink. In the [place] one drink equals one 12 oz bottle of beer (355 mL), one 5 oz glass of wine (148 mL), or one 1 oz glass of hard liquor (44 mL). Lifestyle Brush your teeth every morning and night with fluoride toothpaste. Floss one time each day. Exercise for at least 30 minutes 5 or more days each week. Do not use any products that contain nicotine or tobacco. These products include  cigarettes, chewing tobacco, and vaping devices, such as e-cigarettes. If you need help quitting, ask your health care provider. Do not use drugs. If you are sexually  active, practice safe sex. Use a condom or other form of protection to prevent STIs. Take aspirin only as told by your health care provider. Make sure that you understand how much to take and what form to take. Work with your health care provider to find out whether it is safe and beneficial for you to take aspirin daily. Find healthy ways to manage stress, such as: Meditation, yoga, or listening to music. Journaling. Talking to a trusted person. Spending time with friends and family. Minimize exposure to UV radiation to reduce your risk of skin cancer. Safety Always wear your seat belt while driving or riding in a vehicle. Do not drive: If you have been drinking alcohol. Do not ride with someone who has been drinking. When you are tired or distracted. While texting. If you have been using any mind-altering substances or drugs. Wear a helmet and other protective equipment during sports activities. If you have firearms in your house, make sure you follow all gun safety procedures. What's next? Go to your health care provider once a year for an annual wellness visit. Ask your health care provider how often you should have your eyes and teeth checked. Stay up to date on all vaccines. This information is not intended to replace advice given to you by your health care provider. Make sure you discuss any questions you have with your health care provider. Document Revised: 07/05/2020 Document Reviewed: 07/05/2020 Elsevier Patient Education  [place].

## 2021-01-25 NOTE — Progress Notes (Signed)
Subjective:  Patient ID: Adam Cunningham., male    DOB: 1960-08-29  Age: 61 y.o. MRN: [id number]  CC:  Chief Complaint  Patient presents with   Annual Exam    Pt reports doing well, no concerns     HPI Adam Cunningham. presents for   Annual physical exam  Gout: Last flare: None recently, rare flare, takes colchicine if needed.  Daily meds: Allopurinol 300 mg daily. Prn med: Colchicine, indomethacin, hydrocodone Lab Results  Component Value Date   LABURIC 5.7 01/08/2019   Hyperlipidemia: Borderline ASCVD score in the past, 8.9 in January 2022.  Plan for diet/exercise and weight was improving at that time.  Total cholesterol had decreased from 217-194, LDL from 137-113 . Weight increased in the winter, less active outside in the Winter.  Fasting today.   Lab Results  Component Value Date   CHOL 194 01/25/2020   HDL 38 (L) 01/25/2020   LDLCALC 113 (H) 01/25/2020   TRIG 247 (H) 01/25/2020   CHOLHDL 5.1 (H) 01/25/2020   Lab Results  Component Value Date   ALT 18 02/28/2020   AST 16 02/28/2020   ALKPHOS 73 02/28/2020   BILITOT 0.3 02/28/2020   Recurrent low back pain Episodic flares previously, rare hydrocodone, usually can take Flexeril or indomethacin as needed.  Has also tried colchicine with some improvement previously. No recent flare. Has meds.   Cancer screening Colonoscopy 12/08/2018.  History of prostate cancer with prostatectomy May 2009, urologist Dr. Karsten Ro, then Dr. Abner Greenspan. ongoing PSA monitoring stable. Eval for spermatocoele in July 2022. No surgery. No new symptoms, no change in size.  Cialis has been used effectively for erectile dysfunction without any new side effects.min HA, no vision or hearing changes, no DOE or CP.  Lab Results  Component Value Date   PSA1 <0.1 01/25/2020   PSA1 <0.1 01/08/2019   PSA1 <0.1 01/06/2018   PSA <0.01 12/26/2014   PSA <0.01 12/14/2013   PSA <0.01 12/08/2012   Immunization History  Administered Date(s)  Administered   Influenza Split 12/09/2011   Influenza,inj,Quad PF,6+ Mos 12/08/2012, 12/14/2013, 12/27/2015, 01/07/2017, 01/06/2018, 11/05/2018, 01/25/2020, 11/24/2020   Influenza-Unspecified 11/22/2014   Moderna Sars-Covid-2 Vaccination 01/30/2019, 02/27/2019, 12/23/2019, 12/07/2020   Tdap 10/25/2010   Zoster Recombinat (Shingrix) 01/08/2019, 04/14/2019  COVID infection September 2022, bivalent booster in November.      Depression screen [facility] 2/9 01/25/2021 10/12/2020 02/28/2020 01/25/2020 09/06/2019  Decreased Interest 0 0 0 0 0  Down, Depressed, Hopeless 0 0 0 0 0  PHQ - 2 Score 0 0 0 0 0   Vision Screening   Right eye Left eye Both eyes  Without correction     With correction 20/30 20/30 20/20   Ophthalmology Dr. Herbert Deaner previously - [facility] last year - new one this year at [facility] op[facility]o in March.   Dentist every 6 months  Exercise/obesity - as above.  Body mass index is 34.37 kg/m. Wt Readings from Last 3 Encounters:  01/25/21 253 lb 6.4 oz (114.9 kg)  02/28/20 245 lb (111.1 kg)  01/25/20 242 lb 12.8 oz (110.1 kg)  Some weight gain since his last visit.  Previously exercise was with work at home, outside work, walking 3 times per week up to 2 miles.   Less exercise as above.  May be following up to discuss meds for wt loss.     History Patient Active Problem List   Diagnosis Date Noted   Hyperlipidemia 11/26/2011   Gout 11/26/2011  Low testosterone 11/26/2011   H/O prostate cancer 05/26/2007   Past Medical History:  Diagnosis Date   Cancer [facility])    prostate   Gout    Past Surgical History:  Procedure Laterality Date   BICEPS TENDON REPAIR     PROSTATE SURGERY     SHOULDER OPEN ROTATOR CUFF REPAIR Left 04/29/2019   Procedure: OPEN ROTATOR CUFF REPAIR LEFT SHOULDER;  Surgeon: Carole Civil, MD;  Location: [facility] ORS;  Service: Orthopedics;  Laterality: Left;  and interscalene block    Not on File Prior to Admission medications   Medication Sig Start Date End Date  Taking? Authorizing Provider  colchicine 0.6 MG tablet Take 1 tablet (0.6 mg total) by mouth daily as needed (gout flare ups). 09/06/19  Yes [name], MD  ibuprofen (ADVIL) 800 MG tablet Take 1 tablet (800 mg total) by mouth every 8 (eight) hours as needed. 04/29/19  Yes Carole Civil, MD  indomethacin (INDOCIN) 50 MG capsule Take 1 capsule (50 mg total) by mouth 2 (two) times daily as needed (gout pain.). 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   Social research officer, government   Education: [employer]   Exercise: Yes   Social Determinants of Radio broadcast assistant Strain: Not on file  Food Insecurity: Not on file  Transportation Needs: Not on file  Physical Activity: Not on file  Stress: Not on file  Social Connections: Not on file  Intimate Partner Violence: Not on file    Review of Systems  13 point review of systems per patient health survey noted.  Negative other than as indicated above or in HPI.   Objective:   Vitals:   01/25/21 1421  BP: 130/78  Pulse: 71  Resp: 16  Temp: 98.1 F (36.7 C)  TempSrc: Temporal  SpO2: 99%  Weight: 253 lb 6.4 oz (114.9 kg)   Height: 6' (1.829 m)     Physical Exam Vitals reviewed.  Constitutional:      Appearance: He is well-developed.  HENT:     Head: Normocephalic and atraumatic.     Right Ear: External ear normal.     Left Ear: External ear normal.  Eyes:     Conjunctiva/sclera: Conjunctivae normal.     Pupils: Pupils are equal, round, and reactive to light.  Neck:     Thyroid: No thyromegaly.  Cardiovascular:     Rate and Rhythm: Normal rate and regular rhythm.     Heart sounds: Normal heart sounds.  Pulmonary:  Effort: Pulmonary effort is normal. No respiratory distress.     Breath sounds: Normal breath sounds. No wheezing.  Abdominal:     General: There is no distension.     Palpations: Abdomen is soft.     Tenderness: There is no abdominal tenderness.  Musculoskeletal:        General: No tenderness. Normal range of motion.     Cervical back: Normal range of motion and neck supple.  Lymphadenopathy:     Cervical: No cervical adenopathy.  Skin:    General: Skin is warm and dry.  Neurological:     Mental Status: He is alert and oriented to person, place, and time.     Deep Tendon Reflexes: Reflexes are normal and symmetric.  Psychiatric:        Behavior: Behavior normal.       Assessment & Plan:  Adam Cunningham. is a 61 y.o. male . Annual physical exam  - -anticipatory guidance as below in AVS, screening labs above. Health maintenance items as above in HPI discussed/recommended as applicable.   Mixed hyperlipidemia - Plan: Comprehensive metabolic panel, Lipid panel  -Likely diet/exercise approach but option of coronary calcium scoring depending on ASCVD risk score.  Repeat labs obtained  History of prostate cancer - Plan: PSA  -PSA for ongoing monitoring.  Has urologist if needed.  Also recommended follow-up with myself or urologist if spermatocele appears to be increasing in size or new symptoms.  Screening for deficiency anemia - Plan: CBC with  Differential/Platelet  Screening for diabetes mellitus - Plan: Hemoglobin A1c  Gout, unspecified cause, unspecified chronicity, unspecified site - Plan: allopurinol (ZYLOPRIM) 300 MG tablet Gouty arthritis - Plan: allopurinol (ZYLOPRIM) 300 MG tablet  -Stable with allopurinol, continue same dose, has acute med treatment if needed.  Obesity (BMI 30.0-34.9)  -Watch diet, increase exercise discussed, easier during the summer.  If persistent difficulty with weight loss could look at medication treatment options.  RTC precautions given.  Meds ordered this encounter  Medications   allopurinol (ZYLOPRIM) 300 MG tablet    Sig: TAKE 1 TABLET BY MOUTH ONCE A DAY    Dispense:  90 tablet    Refill:  3   Patient Instructions  I will check some labs including cholesterol levels today.  If borderline ASCVD risk score, can consider continued diet/exercise approach or intermittent dosing of a statin.  Can discuss further once I see results.  Coronary calcium scoring is also an option to help decide on statin medication.  Again we can discuss this further once I see labs.  Try to incorporate some activity/exercise most days per week with goal of 150 minutes/week.  Continue to watch diet, portions, avoid sugar containing beverages and late-night meals.  If difficulty with weight loss, follow-up and we can discuss next steps or possible prescription meds or treatments.  Thanks for coming in today and let me know if there are questions.  Preventive Care 69-70 Years Old, Male Preventive care refers to lifestyle choices and visits with your health care provider that can promote health and wellness. Preventive care visits are also called wellness exams. What can I expect for my preventive care visit? Counseling During your preventive care visit, your health care provider may ask about your: Medical history, including: Past medical problems. Family medical history. Current health, including: Emotional  well-being. Home life and relationship well-being. Sexual activity. Lifestyle, including: Alcohol, nicotine or tobacco, and drug use. Access to firearms. Diet, exercise, and sleep habits. Safety  issues such as seatbelt and bike helmet use. Sunscreen use. Work and work Statistician. Physical exam Your health care provider will check your: Height and weight. These may be used to calculate your BMI (body mass index). BMI is a measurement that tells if you are at a healthy weight. Waist circumference. This measures the distance around your waistline. This measurement also tells if you are at a healthy weight and may help predict your risk of certain diseases, such as type 2 diabetes and high blood pressure. Heart rate and blood pressure. Body temperature. Skin for abnormal spots. What immunizations do I need? Vaccines are usually given at various ages, according to a schedule. Your health care provider will recommend vaccines for you based on your age, medical history, and lifestyle or other factors, such as travel or where you work. What tests do I need? Screening Your health care provider may recommend screening tests for certain conditions. This may include: Lipid and cholesterol levels. Diabetes screening. This is done by checking your blood sugar (glucose) after you have not eaten for a while (fasting). Hepatitis B test. Hepatitis C test. HIV (human immunodeficiency virus) test. STI (sexually transmitted infection) testing, if you are at risk. Lung cancer screening. Prostate cancer screening. Colorectal cancer screening. Talk with your health care provider about your test results, treatment options, and if necessary, the need for more tests. Follow these instructions at home: Eating and drinking  Eat a diet that includes fresh fruits and vegetables, whole grains, lean protein, and low-fat dairy products. Take vitamin and mineral supplements as recommended by your health care  provider. Do not drink alcohol if your health care provider tells you not to drink. If you drink alcohol: Limit how much you have to 0-2 drinks a day. Know how much alcohol is in your drink. In the [place] one drink equals one 12 oz bottle of beer (355 mL), one 5 oz glass of wine (148 mL), or one 1 oz glass of hard liquor (44 mL). Lifestyle Brush your teeth every morning and night with fluoride toothpaste. Floss one time each day. Exercise for at least 30 minutes 5 or more days each week. Do not use any products that contain nicotine or tobacco. These products include cigarettes, chewing tobacco, and vaping devices, such as e-cigarettes. If you need help quitting, ask your health care provider. Do not use drugs. If you are sexually active, practice safe sex. Use a condom or other form of protection to prevent STIs. Take aspirin only as told by your health care provider. Make sure that you understand how much to take and what form to take. Work with your health care provider to find out whether it is safe and beneficial for you to take aspirin daily. Find healthy ways to manage stress, such as: Meditation, yoga, or listening to music. Journaling. Talking to a trusted person. Spending time with friends and family. Minimize exposure to UV radiation to reduce your risk of skin cancer. Safety Always wear your seat belt while driving or riding in a vehicle. Do not drive: If you have been drinking alcohol. Do not ride with someone who has been drinking. When you are tired or distracted. While texting. If you have been using any mind-altering substances or drugs. Wear a helmet and other protective equipment during sports activities. If you have firearms in your house, make sure you follow all gun safety procedures. What's next? Go to your health care provider once a year for an annual wellness visit. Ask  your health care provider how often you should have your eyes and teeth checked. Stay up to  date on all vaccines. This information is not intended to replace advice given to you by your health care provider. Make sure you discuss any questions you have with your health care provider. Document Revised: 07/05/2020 Document Reviewed: 07/05/2020 Elsevier Patient Education  2022 [place],   Merri Ray, MD [place], [facility] 01/25/21 7:08 PM

## 2021-01-26 LAB — CBC WITH DIFFERENTIAL/PLATELET
Basophils Absolute: 0.1 10*3/uL (ref 0.0–0.1)
Basophils Relative: 1.2 % (ref 0.0–3.0)
Eosinophils Absolute: 0.2 10*3/uL (ref 0.0–0.7)
Eosinophils Relative: 2.8 % (ref 0.0–5.0)
HCT: 47.5 % (ref 39.0–52.0)
Hemoglobin: 16.3 g/dL (ref 13.0–17.0)
Lymphocytes Relative: 36.1 % (ref 12.0–46.0)
Lymphs Abs: 2.6 10*3/uL (ref 0.7–4.0)
MCHC: 34.3 g/dL (ref 30.0–36.0)
MCV: 87.1 fl (ref 78.0–100.0)
Monocytes Absolute: 0.3 10*3/uL (ref 0.1–1.0)
Monocytes Relative: 4.7 % (ref 3.0–12.0)
Neutro Abs: 4 10*3/uL (ref 1.4–7.7)
Neutrophils Relative %: 55.2 % (ref 43.0–77.0)
Platelets: 169 10*3/uL (ref 150.0–400.0)
RBC: 5.45 Mil/uL (ref 4.22–5.81)
RDW: 13.7 % (ref 11.5–15.5)
WBC: 7.3 10*3/uL (ref 4.0–10.5)

## 2021-01-26 LAB — LIPID PANEL
Cholesterol: 221 mg/dL — ABNORMAL HIGH (ref 0–200)
HDL: 43.8 mg/dL (ref >39.00)
NonHDL: 177.15
Total CHOL/HDL Ratio: 5
Triglycerides: 221 mg/dL — ABNORMAL HIGH (ref 0.0–149.0)
VLDL: 44.2 mg/dL — ABNORMAL HIGH (ref 0.0–40.0)

## 2021-01-26 LAB — PSA: PSA: 0 ng/mL — ABNORMAL LOW (ref 0.10–4.00)

## 2021-01-26 LAB — COMPREHENSIVE METABOLIC PANEL
ALT: 28 U/L (ref 0–53)
AST: 19 U/L (ref 0–37)
Albumin: 4.5 g/dL (ref 3.5–5.2)
Alkaline Phosphatase: 52 U/L (ref 39–117)
BUN: 13 mg/dL (ref 6–23)
CO2: 25 mEq/L (ref 19–32)
Calcium: 9.1 mg/dL (ref 8.4–10.5)
Chloride: 104 mEq/L (ref 96–112)
Creatinine, Ser: 0.92 mg/dL (ref 0.40–1.50)
GFR: 89.81 mL/min (ref >60.00)
Glucose, Bld: 89 mg/dL (ref 70–99)
Potassium: 3.7 mEq/L (ref 3.5–5.1)
Sodium: 139 mEq/L (ref 135–145)
Total Bilirubin: 0.7 mg/dL (ref 0.2–1.2)
Total Protein: 7.2 g/dL (ref 6.0–8.3)

## 2021-01-26 LAB — LDL CHOLESTEROL, DIRECT: Direct LDL: 143 mg/dL

## 2021-01-26 LAB — HEMOGLOBIN A1C: Hgb A1c MFr Bld: 5.5 % (ref 4.6–6.5)

## 2021-02-11 ENCOUNTER — Encounter: Payer: Self-pay | Admitting: Family Medicine

## 2021-02-11 DIAGNOSIS — E782 Mixed hyperlipidemia: Secondary | ICD-10-CM

## 2021-02-13 ENCOUNTER — Other Ambulatory Visit (HOSPITAL_COMMUNITY): Payer: Self-pay

## 2021-02-13 MED ORDER — ROSUVASTATIN CALCIUM 10 MG PO TABS
10.0000 mg | ORAL_TABLET | Freq: Every day | ORAL | 1 refills | Status: DC
  Filled 2021-02-13: qty 90, 90d supply, fill #0

## 2021-02-13 NOTE — Telephone Encounter (Signed)
Should pt stop crestor if gout flares and he needs to take colchicine?

## 2021-02-14 ENCOUNTER — Other Ambulatory Visit (HOSPITAL_COMMUNITY): Payer: Self-pay

## 2021-03-13 ENCOUNTER — Other Ambulatory Visit (HOSPITAL_COMMUNITY): Payer: Self-pay

## 2021-03-13 MED ORDER — ATORVASTATIN CALCIUM 10 MG PO TABS
10.0000 mg | ORAL_TABLET | Freq: Every day | ORAL | 0 refills | Status: DC
  Filled 2021-03-13: qty 90, 90d supply, fill #0

## 2021-03-13 NOTE — Addendum Note (Signed)
Addended by: Merri Ray R on: 03/13/2021 12:56 PM   Modules accepted: Orders

## 2021-03-13 NOTE — Telephone Encounter (Signed)
Pt reports stopped crestor due to side effects of muscle aches. Please advise if he should resume medication or change?

## 2021-05-14 ENCOUNTER — Other Ambulatory Visit (HOSPITAL_COMMUNITY): Payer: Self-pay

## 2021-06-08 ENCOUNTER — Other Ambulatory Visit (HOSPITAL_COMMUNITY): Payer: Self-pay

## 2021-06-12 ENCOUNTER — Other Ambulatory Visit (HOSPITAL_COMMUNITY): Payer: Self-pay

## 2021-08-07 IMAGING — CT CT RENAL STONE PROTOCOL
2 of 4 series · 17 of 46 positions shown, 19 images · non-contrast
Comparison: None.

CLINICAL DATA: Acute flank pain.

EXAM:
CT ABDOMEN AND PELVIS WITHOUT CONTRAST
TECHNIQUE: Multidetector CT imaging of the abdomen and pelvis was performed
following the standard protocol without IV contrast.

[Series 2: renal stone 5.00 br40 s3 axial · axial · 0.88mm/px · z∈[+1175,+1620]mm · 14 of 99 slices shown, 16 images]
[im 5/99  soft-tissue]
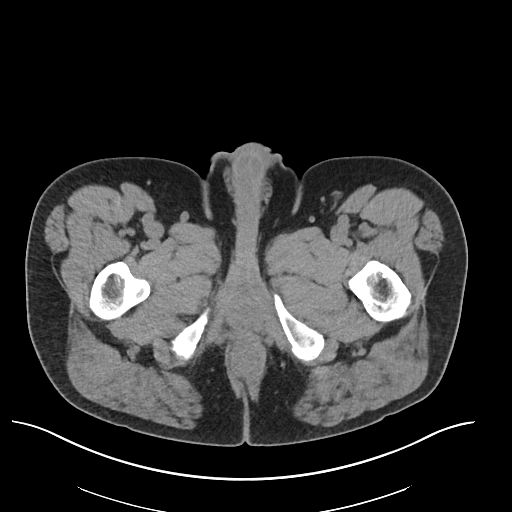
[im 5/99  bone]
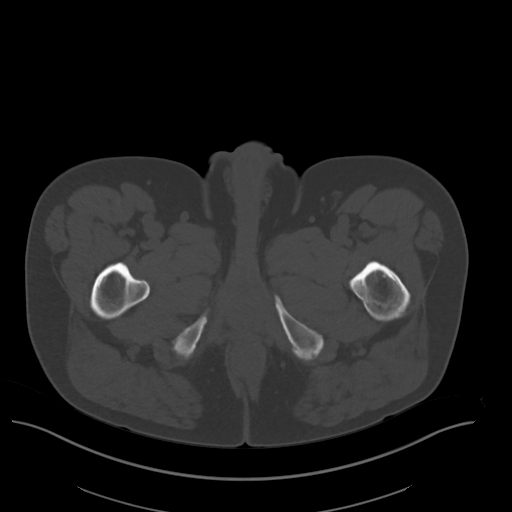
[im 13/99  soft-tissue]
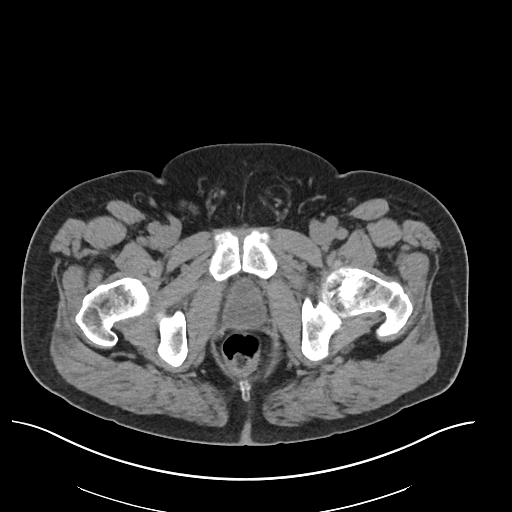
[im 21/99  soft-tissue]
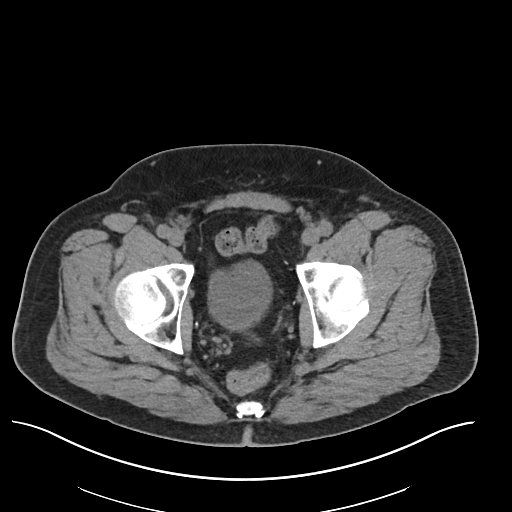
[im 25/99  soft-tissue]
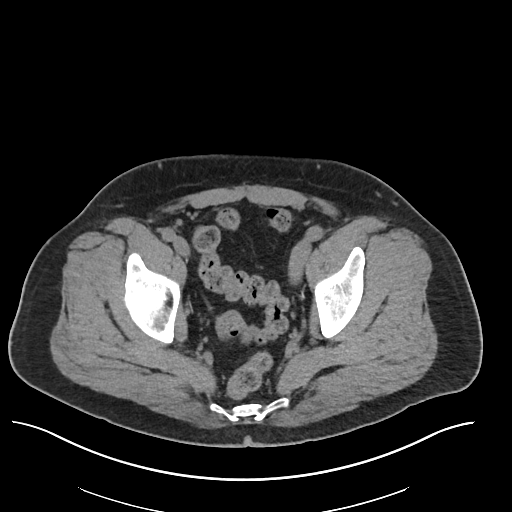
[im 33/99  soft-tissue]
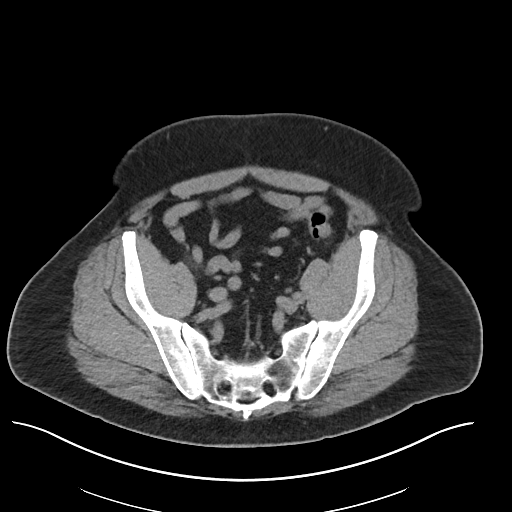
[im 41/99  soft-tissue]
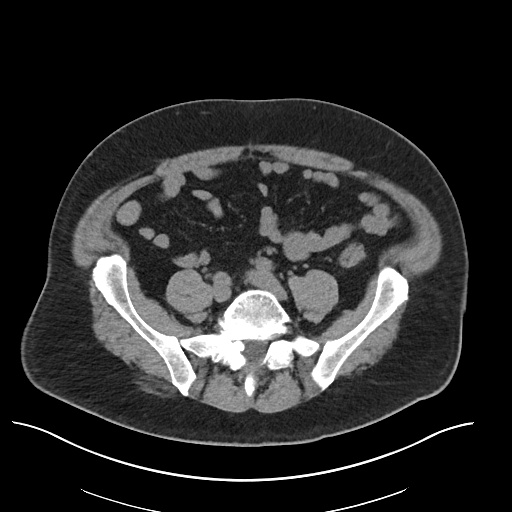
[im 45/99  soft-tissue]
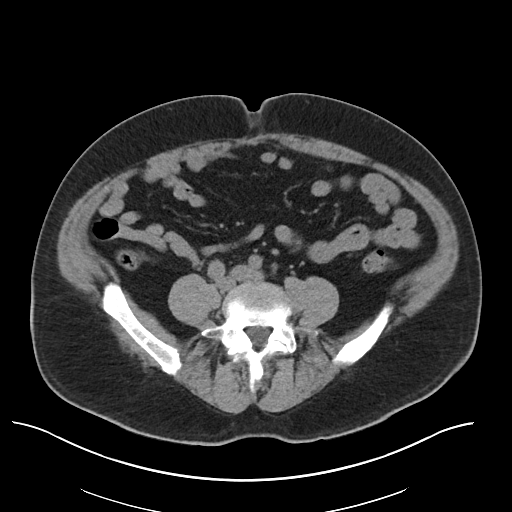
[im 54/99  soft-tissue]
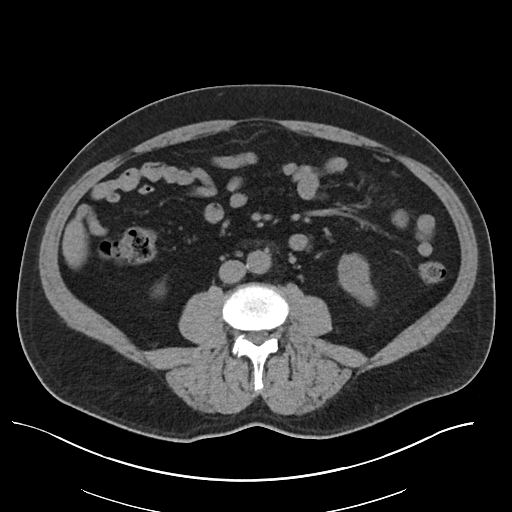
[im 58/99  soft-tissue]
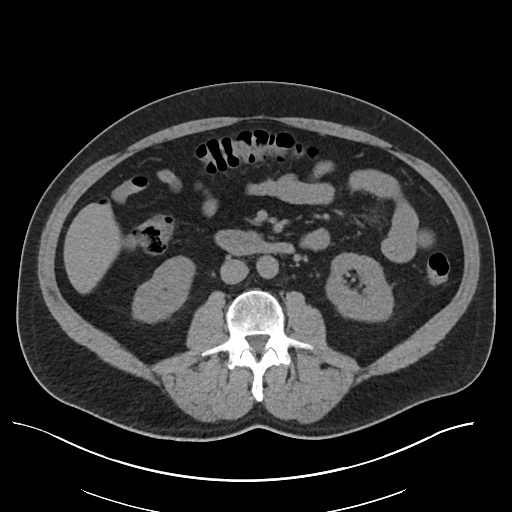
[im 58/99  bone]
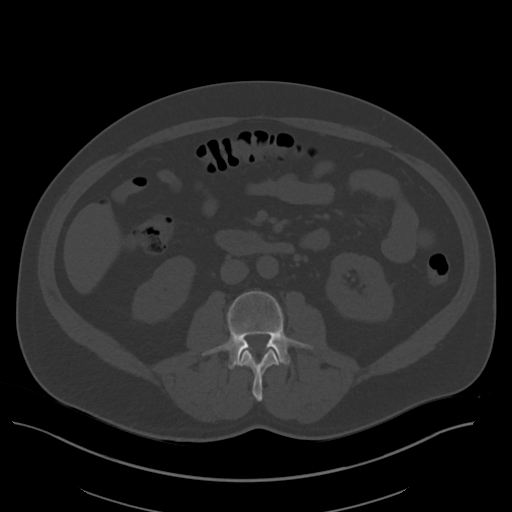
[im 66/99  soft-tissue]
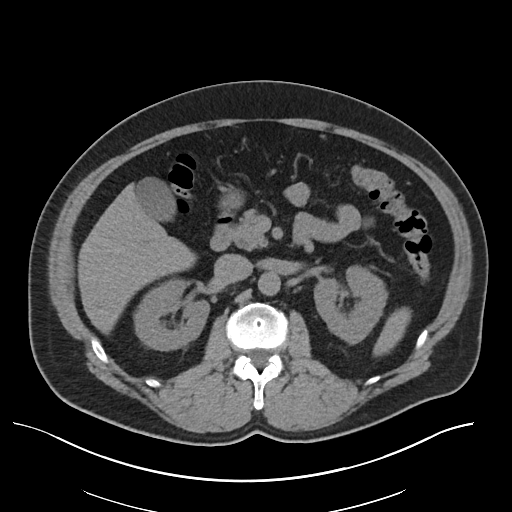
[im 74/99  soft-tissue]
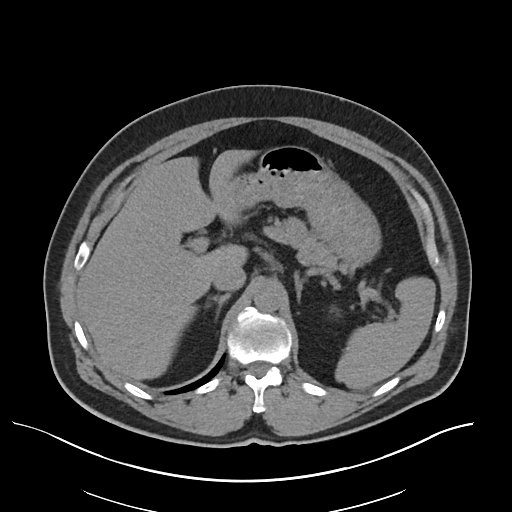
[im 78/99  soft-tissue]
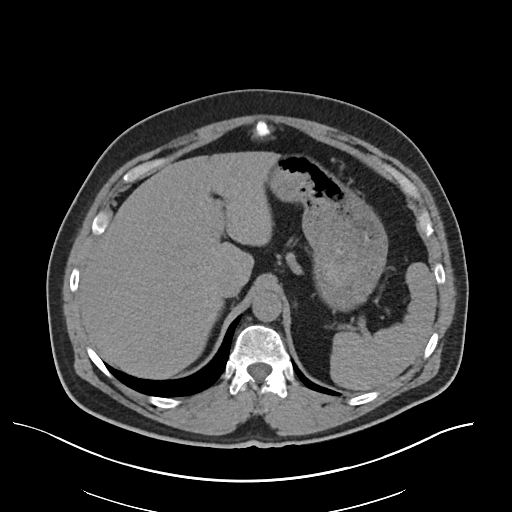
[im 86/99  soft-tissue]
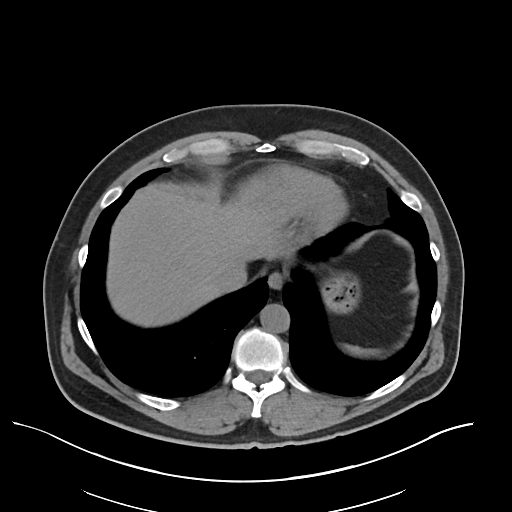
[im 94/99  soft-tissue]
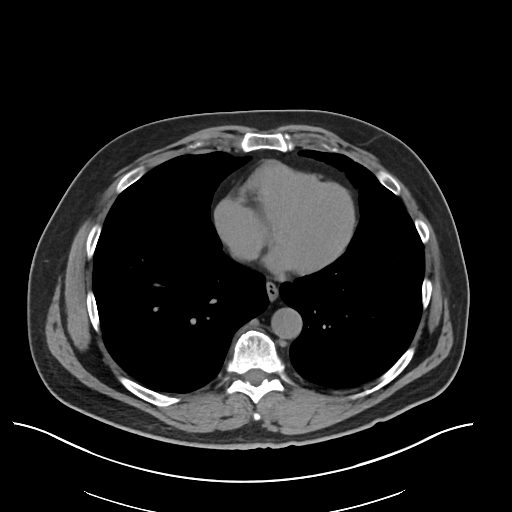

[Series 6: renal stone 2.00 br40 s3 cor · coronal · 0.88mm/px · 3 of 224 slices shown]
[im 75/224  soft-tissue]
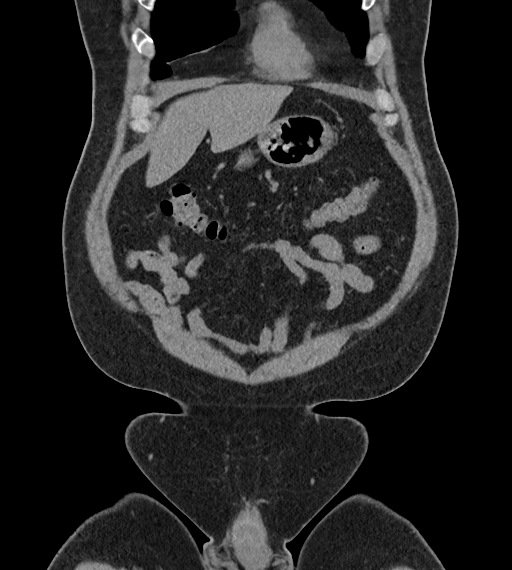
[im 100/224  soft-tissue]
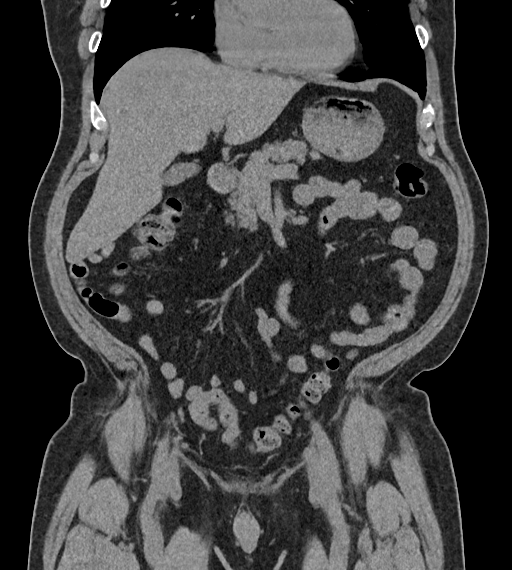
[im 124/224  soft-tissue]
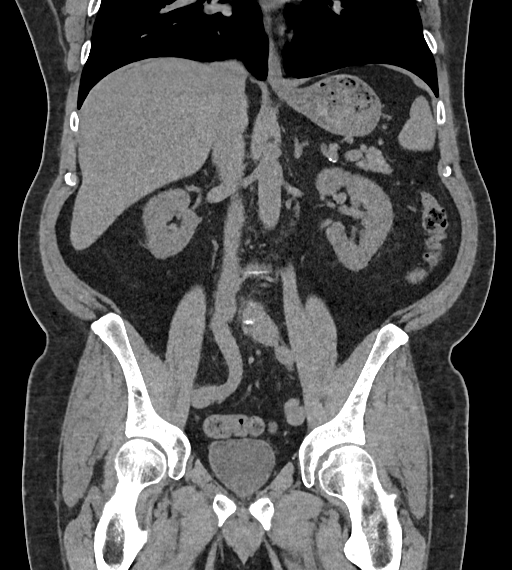

[17 of 46 positions shown; findings below may reference images not displayed]

FINDINGS: Lower chest: No acute abnormality.

Hepatobiliary: No focal liver abnormality is seen. No gallstones,
gallbladder wall thickening, or biliary dilatation.

Pancreas: Unremarkable. No pancreatic ductal dilatation or
surrounding inflammatory changes.

Spleen: Normal in size without focal abnormality.

Adrenals/Urinary Tract: Adrenal glands are unremarkable. Kidneys are
normal, without renal calculi, focal lesion, or hydronephrosis.
Bladder is unremarkable.

Stomach/Bowel: Stomach is within normal limits. Appendix appears
normal. No evidence of bowel wall thickening, distention, or
inflammatory changes.

Vascular/Lymphatic: No significant vascular findings are present. No
enlarged abdominal or pelvic lymph nodes.

Reproductive: Status post prostatectomy.

Other: No abdominal wall hernia or abnormality. No abdominopelvic
ascites.

Musculoskeletal: No acute or significant osseous findings.
IMPRESSION: 1. Status post prostatectomy. No hydronephrosis or renal obstruction
is noted.
2. No renal or ureteral calculi are noted. No acute abnormality seen
in the abdomen or pelvis.

## 2021-08-16 ENCOUNTER — Other Ambulatory Visit (HOSPITAL_COMMUNITY): Payer: Self-pay

## 2021-09-11 ENCOUNTER — Other Ambulatory Visit (HOSPITAL_COMMUNITY): Payer: Self-pay

## 2021-10-16 ENCOUNTER — Other Ambulatory Visit (HOSPITAL_COMMUNITY): Payer: Self-pay

## 2021-11-23 ENCOUNTER — Other Ambulatory Visit (HOSPITAL_COMMUNITY): Payer: Self-pay

## 2022-01-28 ENCOUNTER — Encounter: Payer: Self-pay | Admitting: Family Medicine

## 2022-01-28 ENCOUNTER — Ambulatory Visit (INDEPENDENT_AMBULATORY_CARE_PROVIDER_SITE_OTHER): Payer: 59 | Admitting: Family Medicine

## 2022-01-28 ENCOUNTER — Other Ambulatory Visit (HOSPITAL_COMMUNITY): Payer: Self-pay

## 2022-01-28 VITALS — BP 132/78 | HR 83 | Temp 98.5°F | Ht 72.0 in | Wt 256.0 lb

## 2022-01-28 DIAGNOSIS — E782 Mixed hyperlipidemia: Secondary | ICD-10-CM | POA: Diagnosis not present

## 2022-01-28 DIAGNOSIS — N529 Male erectile dysfunction, unspecified: Secondary | ICD-10-CM

## 2022-01-28 DIAGNOSIS — Z1329 Encounter for screening for other suspected endocrine disorder: Secondary | ICD-10-CM | POA: Diagnosis not present

## 2022-01-28 DIAGNOSIS — M109 Gout, unspecified: Secondary | ICD-10-CM | POA: Diagnosis not present

## 2022-01-28 DIAGNOSIS — Z0000 Encounter for general adult medical examination without abnormal findings: Secondary | ICD-10-CM | POA: Diagnosis not present

## 2022-01-28 DIAGNOSIS — Z6834 Body mass index (BMI) 34.0-34.9, adult: Secondary | ICD-10-CM

## 2022-01-28 DIAGNOSIS — Z23 Encounter for immunization: Secondary | ICD-10-CM

## 2022-01-28 DIAGNOSIS — Z8546 Personal history of malignant neoplasm of prostate: Secondary | ICD-10-CM | POA: Diagnosis not present

## 2022-01-28 MED ORDER — TADALAFIL 20 MG PO TABS
10.0000 mg | ORAL_TABLET | ORAL | 11 refills | Status: DC | PRN
  Filled 2022-01-28: qty 5, 10d supply, fill #0
  Filled 2022-04-06: qty 5, 10d supply, fill #1
  Filled 2022-09-29: qty 5, 25d supply, fill #2
  Filled 2022-12-29: qty 5, 25d supply, fill #3

## 2022-01-28 MED ORDER — ALLOPURINOL 300 MG PO TABS
300.0000 mg | ORAL_TABLET | Freq: Every day | ORAL | 3 refills | Status: DC
  Filled 2022-01-28: qty 90, fill #0
  Filled 2022-04-06: qty 90, 90d supply, fill #0
  Filled 2022-07-18: qty 90, 90d supply, fill #1
  Filled 2022-09-29: qty 90, 90d supply, fill #2

## 2022-01-28 MED ORDER — COLCHICINE 0.6 MG PO TABS
0.6000 mg | ORAL_TABLET | Freq: Every day | ORAL | 1 refills | Status: DC | PRN
  Filled 2022-01-28: qty 30, 30d supply, fill #0

## 2022-01-28 NOTE — Progress Notes (Unsigned)
Subjective:  Patient ID: Adam Cunningham., male    DOB: [birth year]  Age: 62 y.o. MRN: [id number]  CC:  Chief Complaint  Patient presents with   Annual Exam    HPI Adam Cunningham. presents for Annual Exam  Hyperlipidemia: Lipitor 10 mg daily in past. Stopped d/t myalgias, no relief with 2/x week or CoQ10.  The 10-year ASCVD risk score (Arnett DK, et al., 2019) is: 12.6%   Values used to calculate the score:     Age: 69 years     Sex: Male     Is Non-Hispanic African American: No     Diabetic: No     Tobacco smoker: No     Systolic Blood Pressure: 322 mmHg     Is BP treated: No     HDL Cholesterol: 43.8 mg/dL     Total Cholesterol: 221 mg/dL  Lab Results  Component Value Date   CHOL 221 (H) 01/25/2021   HDL 43.80 01/25/2021   LDLCALC 113 (H) 01/25/2020   LDLDIRECT 143.0 01/25/2021   TRIG 221.0 (H) 01/25/2021   CHOLHDL 5 01/25/2021   Lab Results  Component Value Date   ALT 28 01/25/2021   AST 19 01/25/2021   ALKPHOS 52 01/25/2021   BILITOT 0.7 01/25/2021   Gout: Last flare:no recent flare.  Daily meds: Allopurinol 300 mg daily.no new side effects Prn med: Colchicine if needed.  Indomethacin, hydrocodone use previously. Lab Results  Component Value Date   LABURIC 5.7 01/08/2019   Episodic low back pain, treated with hydrocodone, Flexeril indomethacin if needed.  Some improvement with colchicine previously as well.  Cialis for erectile dysfunction. '20mg'$  about once per week. Slight HA, no other side effects. No CP/shortnessof breath with exertion. No vision/hearing changes.      01/28/2022    2:29 PM 01/25/2021    2:24 PM 10/12/2020    8:47 AM 02/28/2020    3:38 PM 01/25/2020    9:14 AM  Depression screen PHQ 2/9  Decreased Interest 0 0 0 0 0  Down, Depressed, Hopeless 0 0 0 0 0  PHQ - 2 Score 0 0 0 0 0    Health Maintenance  Topic Date Due   DTaP/Tdap/Td (2 - Td or Tdap) 10/24/2020   COVID-19 Vaccine (5 - 2023-24 season) 09/21/2021   COLONOSCOPY  (Pts 45-65yr Insurance coverage will need to be confirmed)  12/07/2028   INFLUENZA VACCINE  Completed   Hepatitis C Screening  Completed   HIV Screening  Completed   Zoster Vaccines- Shingrix  Completed   HPV VACCINES  Aged Out  Colonoscopy 12/08/2018 Prostate: Personal history of prostate cancer with prostatectomy in May 2009.  Ongoing PSA monitoring has been stable.  Eval for spermatocele in July 2022, no surgery, no symptoms and no change in size. PSA here for monitoring. 0.00 last year.  ` Lab Results  Component Value Date   PSA1 <0.1 01/25/2020   PSA1 <0.1 01/08/2019   PSA1 <0.1 01/06/2018   PSA 0.00 Repeated and verified X2. (L) 01/25/2021   PSA <0.01 12/26/2014   PSA <0.01 12/14/2013    Immunization History  Administered Date(s) Administered   Influenza Split 12/09/2011   Influenza,inj,Quad PF,6+ Mos 12/08/2012, 12/14/2013, 12/27/2015, 01/07/2017, 01/06/2018, 11/05/2018, 01/25/2020, 11/24/2020, 01/28/2022   Influenza-Unspecified 11/22/2014   Moderna Sars-Covid-2 Vaccination 01/30/2019, 02/27/2019, 12/23/2019, 12/07/2020   Tdap 10/25/2010   Zoster Recombinat (Shingrix) 01/08/2019, 04/14/2019  Td today.  Covid 19 booster last year. Newest booster discussed.  RSV vaccine  discussed.  Flu vaccine today.   No results found. Wears glasses, optho yearly, upcoming appt in March.   Dental: every 6 months.   Alcohol: rare - less than 1/month.   Tobacco: none  Exercise: working at [employer] few days per week. Moving wood, etc.  No recent change in diet.  Some sodas over the holidays, usually water.  Body mass index is 34.72 kg/m. Would like to discuss options for weight loss. Plans to follow up to discuss.  Met with nutritionist in past - not effective.   History Patient Active Problem List   Diagnosis Date Noted   Hyperlipidemia 11/26/2011   Gout 11/26/2011   Low testosterone 11/26/2011   H/O prostate cancer 05/26/2007   Past Medical History:  Diagnosis Date    Cancer [facility])    prostate   Gout    Past Surgical History:  Procedure Laterality Date   BICEPS TENDON REPAIR     PROSTATE SURGERY     SHOULDER OPEN ROTATOR CUFF REPAIR Left 04/29/2019   Procedure: OPEN ROTATOR CUFF REPAIR LEFT SHOULDER;  Surgeon: Carole Civil, MD;  Location: [facility];  Service: Orthopedics;  Laterality: Left;  and interscalene block    Allergies  Allergen Reactions   Crestor [Rosuvastatin Calcium] Other (See Comments)    Myalgias, fatigue.    Prior to Admission medications   Medication Sig Start Date End Date Taking? Authorizing Provider  allopurinol (ZYLOPRIM) 300 MG tablet TAKE 1 TABLET BY MOUTH ONCE A DAY 01/25/21 03/10/22 Yes [name], MD  colchicine 0.6 MG tablet Take 1 tablet (0.6 mg total) by mouth daily as needed (gout flare ups). 09/06/19  Yes [name], MD  ibuprofen (ADVIL) 800 MG tablet Take 1 tablet (800 mg total) by mouth every 8 (eight) hours as needed. 04/29/19  Yes Carole Civil, MD  indomethacin (INDOCIN) 50 MG capsule Take 1 capsule (50 mg total) by mouth 2 (two) times daily as needed (gout pain.). 09/06/19  Yes [name], MD  atorvastatin (LIPITOR) 10 MG tablet Take 1 tablet by mouth daily. Initially start once per week. 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   Social research officer, government   Education: [employer]   Exercise: Yes   Social Determinants of Radio broadcast assistant Strain: Not on file  Food Insecurity: Not on file  Transportation Needs: Not on file  Physical Activity: Not on file  Stress: Not on file  Social Connections: Not on file  Intimate Partner Violence: Not on file    Review of Systems 13 point review of systems per patient health survey  noted.  Negative other than as indicated above or in HPI.    Objective:   Vitals:   01/28/22 1415  BP: 132/78  Pulse: 83  Temp: 98.5 F (36.9 C)  SpO2: 95%  Weight: 256 lb (116.1 kg)  Height: 6' (1.829 m)   {Vitals History (Optional):23777}  Physical Exam Vitals reviewed.  Constitutional:      Appearance: He is well-developed.  HENT:     Head: Normocephalic and atraumatic.     Right Ear: External ear normal.     Left Ear: External ear normal.  Eyes:     Conjunctiva/sclera: Conjunctivae normal.     Pupils: Pupils are equal, round, and reactive to light.  Neck:     Thyroid: No thyromegaly.  Cardiovascular:     Rate and Rhythm: Normal rate and regular rhythm.     Heart sounds: Normal heart sounds.  Pulmonary:     Effort: Pulmonary effort is normal. No respiratory distress.     Breath sounds: Normal breath sounds. No wheezing.  Abdominal:     General: There is no distension.     Palpations: Abdomen is soft.     Tenderness: There is no abdominal tenderness.  Musculoskeletal:        General: No tenderness. Normal range of motion.     Cervical back: Normal range of motion and neck supple.  Lymphadenopathy:     Cervical: No cervical adenopathy.  Skin:    General: Skin is warm and dry.  Neurological:     Mental Status: He is alert and oriented to person, place, and time.     Deep Tendon Reflexes: Reflexes are normal and symmetric.  Psychiatric:        Behavior: Behavior normal.         Assessment & Plan:  Levell Tavano. is a 62 y.o. male . No diagnosis found.   No orders of the defined types were placed in this encounter.  There are no Patient Instructions on file for this visit.    Signed,   Merri Ray, MD [place], [facility] 01/28/22 3:06 PM

## 2022-01-28 NOTE — Patient Instructions (Addendum)
Will recheck the cholesterol levels today and depending on those levels as well as the 10-year heart disease risk score we can consider other medications like Zetia or could consider a coronary calcium scoring test to look at possible need for a statin medication.  This is usually $100 out-of-pocket but can sometimes help determine whether you do or do not need a statin.  I will let you know once I review the results of the labs today.  RSV and covid vaccins are available if you want at your pharmacy.  [email or web address]  Check with your insurance regarding weight loss coverage and we can discuss meds if needed next visit. Avoid sugar containing beverages, watch portions, and try to get 150 minutes low intensity exercise per week and keep a record of your exercise for your next visit.  Let me know if there are questions in the meantime.   Thanks for coming in today. Take care.   Preventive Care 59-77 Years Old, Male Preventive care refers to lifestyle choices and visits with your health care provider that can promote health and wellness. Preventive care visits are also called wellness exams. What can I expect for my preventive care visit? Counseling During your preventive care visit, your health care provider may ask about your: Medical history, including: Past medical problems. Family medical history. Current health, including: Emotional well-being. Home life and relationship well-being. Sexual activity. Lifestyle, including: Alcohol, nicotine or tobacco, and drug use. Access to firearms. Diet, exercise, and sleep habits. Safety issues such as seatbelt and bike helmet use. Sunscreen use. Work and work Statistician. Physical exam Your health care provider will check your: Height and weight. These may be used to calculate your BMI (body mass index). BMI is a measurement that tells if you are at a healthy weight. Waist circumference. This  measures the distance around your waistline. This measurement also tells if you are at a healthy weight and may help predict your risk of certain diseases, such as type 2 diabetes and high blood pressure. Heart rate and blood pressure. Body temperature. Skin for abnormal spots. What immunizations do I need?  Vaccines are usually given at various ages, according to a schedule. Your health care provider will recommend vaccines for you based on your age, medical history, and lifestyle or other factors, such as travel or where you work. What tests do I need? Screening Your health care provider may recommend screening tests for certain conditions. This may include: Lipid and cholesterol levels. Diabetes screening. This is done by checking your blood sugar (glucose) after you have not eaten for a while (fasting). Hepatitis B test. Hepatitis C test. HIV (human immunodeficiency virus) test. STI (sexually transmitted infection) testing, if you are at risk. Lung cancer screening. Prostate cancer screening. Colorectal cancer screening. Talk with your health care provider about your test results, treatment options, and if necessary, the need for more tests. Follow these instructions at home: Eating and drinking  Eat a diet that includes fresh fruits and vegetables, whole grains, lean protein, and low-fat dairy products. Take vitamin and mineral supplements as recommended by your health care provider. Do not drink alcohol if your health care provider tells you not to drink. If you drink alcohol: Limit how much you have to 0-2 drinks a day. Know how much alcohol is in your drink. In the [place] one drink equals one 12 oz bottle of beer (355 mL), one 5 oz glass of wine (148 mL), or one 1 oz glass of hard liquor (44  mL). Lifestyle Brush your teeth every morning and night with fluoride toothpaste. Floss one time each day. Exercise for at least 30 minutes 5 or more days each week. Do not use any  products that contain nicotine or tobacco. These products include cigarettes, chewing tobacco, and vaping devices, such as e-cigarettes. If you need help quitting, ask your health care provider. Do not use drugs. If you are sexually active, practice safe sex. Use a condom or other form of protection to prevent STIs. Take aspirin only as told by your health care provider. Make sure that you understand how much to take and what form to take. Work with your health care provider to find out whether it is safe and beneficial for you to take aspirin daily. Find healthy ways to manage stress, such as: Meditation, yoga, or listening to music. Journaling. Talking to a trusted person. Spending time with friends and family. Minimize exposure to UV radiation to reduce your risk of skin cancer. Safety Always wear your seat belt while driving or riding in a vehicle. Do not drive: If you have been drinking alcohol. Do not ride with someone who has been drinking. When you are tired or distracted. While texting. If you have been using any mind-altering substances or drugs. Wear a helmet and other protective equipment during sports activities. If you have firearms in your house, make sure you follow all gun safety procedures. What's next? Go to your health care provider once a year for an annual wellness visit. Ask your health care provider how often you should have your eyes and teeth checked. Stay up to date on all vaccines. This information is not intended to replace advice given to you by your health care provider. Make sure you discuss any questions you have with your health care provider. Document Revised: 07/05/2020 Document Reviewed: 07/05/2020 Elsevier Patient Education  [place].

## 2022-01-29 ENCOUNTER — Other Ambulatory Visit (HOSPITAL_COMMUNITY): Payer: Self-pay

## 2022-01-29 LAB — COMPREHENSIVE METABOLIC PANEL
ALT: 21 U/L (ref 0–53)
AST: 19 U/L (ref 0–37)
Albumin: 4.6 g/dL (ref 3.5–5.2)
Alkaline Phosphatase: 52 U/L (ref 39–117)
BUN: 12 mg/dL (ref 6–23)
CO2: 30 mEq/L (ref 19–32)
Calcium: 9.2 mg/dL (ref 8.4–10.5)
Chloride: 101 mEq/L (ref 96–112)
Creatinine, Ser: 0.92 mg/dL (ref 0.40–1.50)
GFR: 89.18 mL/min (ref >60.00)
Glucose, Bld: 86 mg/dL (ref 70–99)
Potassium: 3.9 mEq/L (ref 3.5–5.1)
Sodium: 140 mEq/L (ref 135–145)
Total Bilirubin: 0.7 mg/dL (ref 0.2–1.2)
Total Protein: 7.4 g/dL (ref 6.0–8.3)

## 2022-01-29 LAB — LDL CHOLESTEROL, DIRECT: Direct LDL: 107 mg/dL

## 2022-01-29 LAB — LIPID PANEL
Cholesterol: 204 mg/dL — ABNORMAL HIGH (ref 0–200)
HDL: 40.2 mg/dL (ref >39.00)
NonHDL: 164.06
Total CHOL/HDL Ratio: 5
Triglycerides: 328 mg/dL — ABNORMAL HIGH (ref 0.0–149.0)
VLDL: 65.6 mg/dL — ABNORMAL HIGH (ref 0.0–40.0)

## 2022-01-29 LAB — PSA: PSA: 0 ng/mL — ABNORMAL LOW (ref 0.10–4.00)

## 2022-01-29 LAB — TSH: TSH: 0.91 u[IU]/mL (ref 0.35–5.50)

## 2022-02-07 ENCOUNTER — Encounter: Payer: Self-pay | Admitting: Family Medicine

## 2022-02-07 DIAGNOSIS — E782 Mixed hyperlipidemia: Secondary | ICD-10-CM

## 2022-02-07 NOTE — Telephone Encounter (Signed)
Patient is giving the go ahead to order the Calcium score testing

## 2022-03-04 ENCOUNTER — Ambulatory Visit (HOSPITAL_BASED_OUTPATIENT_CLINIC_OR_DEPARTMENT_OTHER)
Admission: RE | Admit: 2022-03-04 | Discharge: 2022-03-04 | Disposition: A | Discharge status: Home / Self Care | Payer: 59 | Source: Ambulatory Visit | Attending: Family Medicine | Admitting: Family Medicine

## 2022-03-04 DIAGNOSIS — E782 Mixed hyperlipidemia: Secondary | ICD-10-CM

## 2022-03-12 ENCOUNTER — Encounter: Payer: Self-pay | Admitting: Family Medicine

## 2022-03-12 DIAGNOSIS — E782 Mixed hyperlipidemia: Secondary | ICD-10-CM

## 2022-03-14 NOTE — Addendum Note (Signed)
Addended by: Patrcia Dolly on: 03/14/2022 04:36 PM   Modules accepted: Orders

## 2022-03-14 NOTE — Telephone Encounter (Signed)
Pt is happy to see Dr Debara Pickett

## 2022-03-15 NOTE — Addendum Note (Signed)
Addended by: Merri Ray R on: 03/15/2022 01:04 PM   Modules accepted: Orders

## 2022-03-21 ENCOUNTER — Encounter: Payer: Self-pay | Admitting: Radiology

## 2022-03-21 ENCOUNTER — Encounter: Payer: Self-pay | Admitting: Family Medicine

## 2022-03-21 NOTE — Telephone Encounter (Signed)
Pt is asking if he should postpone appt with you until after appt with Dr Debara Pickett please advise if this is necessary

## 2022-04-06 ENCOUNTER — Other Ambulatory Visit (HOSPITAL_COMMUNITY): Payer: Self-pay

## 2022-04-24 DIAGNOSIS — H52203 Unspecified astigmatism, bilateral: Secondary | ICD-10-CM | POA: Diagnosis not present

## 2022-04-24 DIAGNOSIS — H5203 Hypermetropia, bilateral: Secondary | ICD-10-CM | POA: Diagnosis not present

## 2022-04-24 DIAGNOSIS — H524 Presbyopia: Secondary | ICD-10-CM | POA: Diagnosis not present

## 2022-04-29 ENCOUNTER — Ambulatory Visit: Payer: 59 | Admitting: Family Medicine

## 2022-04-30 ENCOUNTER — Ambulatory Visit (HOSPITAL_BASED_OUTPATIENT_CLINIC_OR_DEPARTMENT_OTHER): Payer: 59 | Admitting: Internal Medicine

## 2022-04-30 ENCOUNTER — Encounter (HOSPITAL_BASED_OUTPATIENT_CLINIC_OR_DEPARTMENT_OTHER): Payer: Self-pay | Admitting: Internal Medicine

## 2022-04-30 VITALS — BP 136/88 | HR 69 | Ht 73.0 in | Wt 244.2 lb

## 2022-04-30 DIAGNOSIS — E782 Mixed hyperlipidemia: Secondary | ICD-10-CM

## 2022-04-30 DIAGNOSIS — M791 Myalgia, unspecified site: Secondary | ICD-10-CM

## 2022-04-30 DIAGNOSIS — I77810 Thoracic aortic ectasia: Secondary | ICD-10-CM

## 2022-04-30 DIAGNOSIS — T466X5A Adverse effect of antihyperlipidemic and antiarteriosclerotic drugs, initial encounter: Secondary | ICD-10-CM

## 2022-04-30 NOTE — Progress Notes (Signed)
LIPID CLINIC CONSULT NOTE  Chief Complaint:  Manage dyslipidemia  Primary Care Physician: Shade FloodGreene, Jeffrey R, MD  Primary Cardiologist:  None  HPI:  Adam LintWilliam E Scholler Jr. is a 62 y.o. male who is being seen today for the evaluation of dyslipidemia at the request of Shade FloodGreene, Jeffrey R, MD. Adam Cunningham is seen today for evaluation management of dyslipidemia.  He had a coronary calcium score in February 2024 which demonstrated a total calcium score 104, 63rd percentile for age and matched controls.  He was also noted to have a dilated ascending aorta which measured between 40 and 44 mm.  This will require follow-up imaging in 1 year.  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  He is asymptomatic denying chest pain or shortness of breath.  He has an intolerance to rosuvastatin in the past which caused significant fatigue and myalgias.  Other statins cause similar symptoms.  Since his last labs, he has made significant dietary changes and had lost good amount of weight.  He is interested to see the effect of these changes.  Direct LDL was 107 in January with total cholesterol 204, triglycerides 328 and HDL 40.  PMHx:  Past Medical History:  Diagnosis Date   Cancer    prostate   Gout     Past Surgical History:  Procedure Laterality Date   BICEPS TENDON REPAIR     PROSTATE SURGERY     SHOULDER OPEN ROTATOR CUFF REPAIR Left 04/29/2019   Procedure: OPEN ROTATOR CUFF REPAIR LEFT SHOULDER;  Surgeon: Vickki HearingHarrison, Stanley E, MD;  Location: [facility];  Service: Orthopedics;  Laterality: Left;  and interscalene block     FAMHx:  Family History  Problem Relation Age of Onset   Cancer Mother        lung   Cancer Father        prostate ca   Cirrhosis Father        non-alcoholic   Cancer Maternal Grandfather        Prostate   Colon cancer Neg Hx    Colon polyps Neg Hx    Esophageal cancer Neg Hx    Rectal cancer Neg Hx    Stomach cancer Neg Hx     SOCHx:    reports that he has never smoked. He has never used smokeless tobacco. He reports that he does not drink alcohol and does not use drugs.  ALLERGIES:  Allergies  Allergen Reactions   Crestor [Rosuvastatin Calcium] Other (See Comments)    Myalgias, fatigue.     ROS: Pertinent items noted in HPI and remainder of comprehensive ROS otherwise negative.  HOME MEDS: Current Outpatient Medications on File Prior to Visit  Medication Sig Dispense Refill   allopurinol (ZYLOPRIM) 300 MG tablet Take 1 tablet (300 mg total) by mouth daily. 90 tablet 3   colchicine 0.6 MG tablet Take 1 tablet (0.6 mg total) by mouth daily as needed (gout flare ups). 30 tablet 1   ibuprofen (ADVIL) 800 MG tablet Take 1 tablet (800 mg total) by mouth every 8 (eight) hours as needed. 30 tablet 0   tadalafil (CIALIS) 20 MG tablet TAKE 1/2-1 TABLET BY MOUTH EVERY OTHER DAY AS NEEDED FOR ERECTILE DYSFUNCTION 5 tablet 11   No current facility-administered medications on file prior to visit.    LABS/IMAGING: No results found for this or any previous visit (from the past 48 hour(s)). No results found.  LIPID PANEL:    Component  Value Date/Time   CHOL 204 (H) 01/28/2022 1545   CHOL 194 01/25/2020 1115   TRIG 328.0 (H) 01/28/2022 1545   HDL 40.20 01/28/2022 1545   HDL 38 (L) 01/25/2020 1115   CHOLHDL 5 01/28/2022 1545   VLDL 65.6 (H) 01/28/2022 1545   LDLCALC 113 (H) 01/25/2020 1115   LDLDIRECT 107.0 01/28/2022 1545    WEIGHTS: Wt Readings from Last 3 Encounters:  04/30/22 244 lb 3.2 oz (110.8 kg)  01/28/22 256 lb (116.1 kg)  01/25/21 253 lb 6.4 oz (114.9 kg)    VITALS: BP 136/88   Pulse 69   Ht 6\' 1"  (1.854 m)   Wt 244 lb 3.2 oz (110.8 kg)   SpO2 99%   BMI 32.22 kg/m   EXAM: Deferred  EKG: Deferred  ASSESSMENT: Mixed dyslipidemia, goal LDL less than 70 Statin intolerant-myalgias Elevated CAC score of 104, 63rd percentile for age and sex matched controls 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   Mr. Elenor Legato has been intolerant to statins but recently lost significant weight with dietary changes.  I would like to repeat his lipids and check an LP(a).  This will help [place] determine a starting point for further therapy.  He might be a candidate for PCSK9 inhibitor.  He is noted to have a dilated ascending aorta between 40 and 44 mm.  He will need repeat imaging of this in 1 year with a CT angiogram of the aorta.  I will go ahead and order this so it is not lost and could be followed up by his PCP or myself.  Based on his lipids I will make further adjustments and plan repeat labs in about 3 to 4 months and follow-up at that time.  Thanks again for the kind referral.  Chrystie Nose, MD, [facility]  [place]  [facility]  Medical Director of the Advanced Lipid Disorders &  Cardiovascular Risk Reduction Clinic Diplomate of the American Board of Clinical Lipidology Attending Cardiologist  Direct Dial: [phone]  Fax: [phone]  Website:  [email or web address] Nicely Kamelia Lampkins 04/30/2022, 3:45 PM

## 2022-04-30 NOTE — Patient Instructions (Signed)
Medication Instructions:  NO CHANGES today   *If you need a refill on your cardiac medications before your next appointment, please call your pharmacy*   Lab Work: FASTING NMR lipoprofile and LPa  If you have labs (blood work) drawn today and your tests are completely normal, you will receive your results only by: MyChart Message (if you have MyChart) OR A paper copy in the mail If you have any lab test that is abnormal or we need to change your treatment, we will call you to review the results.   Testing/Procedures: CT Angiogram chest/aorta in 1 year   Follow-Up: At [facility], you and your health needs are our priority.  As part of our continuing mission to provide you with exceptional heart care, we have created designated Provider Care Teams.  These Care Teams include your primary Cardiologist (physician) and Advanced Practice Providers (APPs -  Physician Assistants and Nurse Practitioners) who all work together to provide you with the care you need, when you need it.  We recommend signing up for the patient portal called "MyChart".  Sign up information is provided on this After Visit Summary.  MyChart is used to connect with patients for Virtual Visits (Telemedicine).  Patients are able to view lab/test results, encounter notes, upcoming appointments, etc.  Non-urgent messages can be sent to your provider as well.   To learn more about what you can do with MyChart, go to [email or web address].    Your next appointment:    12 months with Dr. Rennis Golden

## 2022-05-01 LAB — NMR, LIPOPROFILE
Cholesterol, Total: 186 mg/dL (ref 100–199)
HDL Particle Number: 29.9 umol/L — ABNORMAL LOW (ref >=30.5)
HDL-C: 37 mg/dL — ABNORMAL LOW (ref >39)
LDL Particle Number: 1402 nmol/L — ABNORMAL HIGH (ref <1000)
LDL Size: 19.9 nm — ABNORMAL LOW (ref >20.5)
LDL-C (NIH Calc): 111 mg/dL — ABNORMAL HIGH (ref 0–99)
LP-IR Score: 77 — ABNORMAL HIGH (ref <=45)
Small LDL Particle Number: 1005 nmol/L — ABNORMAL HIGH (ref <=527)
Triglycerides: 215 mg/dL — ABNORMAL HIGH (ref 0–149)

## 2022-05-01 LAB — LIPOPROTEIN A (LPA): Lipoprotein (a): 82.6 nmol/L — ABNORMAL HIGH (ref <75.0)

## 2022-05-02 ENCOUNTER — Other Ambulatory Visit: Payer: Self-pay

## 2022-05-02 ENCOUNTER — Encounter (HOSPITAL_BASED_OUTPATIENT_CLINIC_OR_DEPARTMENT_OTHER): Payer: Self-pay | Admitting: Internal Medicine

## 2022-05-02 DIAGNOSIS — E782 Mixed hyperlipidemia: Secondary | ICD-10-CM

## 2022-05-02 MED ORDER — EZETIMIBE 10 MG PO TABS
10.0000 mg | ORAL_TABLET | Freq: Every day | ORAL | 3 refills | Status: DC
  Filled 2022-05-02: qty 90, 90d supply, fill #0
  Filled 2022-09-29: qty 90, 90d supply, fill #1
  Filled 2022-12-29: qty 90, 90d supply, fill #2
  Filled 2023-04-05: qty 90, 90d supply, fill #3

## 2022-05-13 ENCOUNTER — Encounter: Payer: Self-pay | Admitting: Orthopedic Surgery

## 2022-05-13 ENCOUNTER — Other Ambulatory Visit: Payer: Self-pay

## 2022-05-13 ENCOUNTER — Ambulatory Visit (HOSPITAL_COMMUNITY)
Admission: RE | Admit: 2022-05-13 | Discharge: 2022-05-13 | Disposition: A | Discharge status: Home / Self Care | Payer: 59 | Source: Ambulatory Visit | Attending: Orthopedic Surgery | Admitting: Orthopedic Surgery

## 2022-05-13 ENCOUNTER — Ambulatory Visit: Payer: 59 | Admitting: Orthopedic Surgery

## 2022-05-13 VITALS — BP 140/96 | HR 89 | Ht 73.0 in | Wt 233.0 lb

## 2022-05-13 DIAGNOSIS — M5126 Other intervertebral disc displacement, lumbar region: Secondary | ICD-10-CM

## 2022-05-13 DIAGNOSIS — M5441 Lumbago with sciatica, right side: Secondary | ICD-10-CM | POA: Diagnosis not present

## 2022-05-13 DIAGNOSIS — M5136 Other intervertebral disc degeneration, lumbar region: Secondary | ICD-10-CM | POA: Insufficient documentation

## 2022-05-13 DIAGNOSIS — M47816 Spondylosis without myelopathy or radiculopathy, lumbar region: Secondary | ICD-10-CM | POA: Diagnosis not present

## 2022-05-13 MED ORDER — GABAPENTIN 300 MG PO CAPS
300.0000 mg | ORAL_CAPSULE | Freq: Three times a day (TID) | ORAL | 5 refills | Status: AC
Start: 2022-05-13 — End: ?

## 2022-05-13 MED ORDER — METHOCARBAMOL 750 MG PO TABS
750.0000 mg | ORAL_TABLET | Freq: Four times a day (QID) | ORAL | 2 refills | Status: DC
Start: 2022-05-13 — End: 2022-06-19

## 2022-05-13 MED ORDER — OXYCODONE-ACETAMINOPHEN 5-325 MG PO TABS
1.0000 | ORAL_TABLET | Freq: Three times a day (TID) | ORAL | 0 refills | Status: DC | PRN
Start: 2022-05-13 — End: 2022-05-17

## 2022-05-13 MED ORDER — PREDNISONE 10 MG (48) PO TBPK
ORAL_TABLET | Freq: Every day | ORAL | 0 refills | Status: DC
Start: 2022-05-13 — End: 2022-10-30

## 2022-05-13 NOTE — Progress Notes (Signed)
Office Visit Note   Patient: Adam Cunningham.           Date of Birth: [birth year]           MRN: [id number] Visit Date: 05/13/2022 Requested by: Shade Flood, MD [address] [place],  [place] [address] PCP: Shade Flood, MD  Subjective: Chief Complaint  Patient presents with  . Back Pain    LBP/ RT w/ sciatica Pain started worsening Friday 05/10/22     HPI: Mr. Adam Cunningham is 62 years old he presents with acute onset of right lower back pain radiating to the top of his right foot.  He denies any trauma.  He was mowing the lawn but nothing else strenuous.  Pain progressively worsened over the weekend and despite the following medication he did not get any relief  60 mg of prednisone a day x 3 days  Flexeril 10 mg q 8h  600 gabapentin tid   Norco 5 mg 325 3 pills   He presents with difficulty walking and putting pressure on his right leg right-sided lower back pain with radiation into the top of the foot                ROS: Bowel and bladder function intact no significant weakness  Assessment & Plan:  Images personally read and my interpretation : Previous imaging in 2021 shows some degenerative disc changes and some retrolisthesis please see dictated report which was done elsewhere  Visit Diagnoses:  1. Lumbar disc herniation   2. Acute right-sided low back pain with right-sided sciatica   3. Degenerative disc disease, lumbar     Plan: Recommend MRI of the lumbar spine  I will see him after that but we will try to get him an appointment with Dr. Christell Constant  His reflexes are intact his strength is intact straight leg raise is negative his femoral nerve stretch test is weakly positive  I do not think he will need any surgery just medical management plus or minus physical therapy and epidural injection  Follow-Up Instructions: Return for MRI RESULTS.   Orders:  Meds ordered this encounter  Medications  . predniSONE (STERAPRED UNI-PAK 48 TAB) 10 MG  (48) TBPK tablet    Sig: Take by mouth daily.    Dispense:  48 tablet    Refill:  0  . gabapentin (NEURONTIN) 300 MG capsule    Sig: Take 1 capsule (300 mg total) by mouth 3 (three) times daily.    Dispense:  90 capsule    Refill:  5  . oxyCODONE-acetaminophen (PERCOCET/ROXICET) 5-325 MG tablet    Sig: Take 1 tablet by mouth every 8 (eight) hours as needed for up to 5 days for severe pain.    Dispense:  15 tablet    Refill:  0  . methocarbamol (ROBAXIN) 750 MG tablet    Sig: Take 1 tablet (750 mg total) by mouth 4 (four) times daily.    Dispense:  60 tablet    Refill:  2      Objective: Vital Signs: BP (!) 140/96   Pulse 89   Ht  (1.854 m)   Wt 233 lb (105.7 kg)   SpO2 95%   BMI 30.74 kg/m   Physical Exam Constitutional:      General: He is in acute distress.     Appearance: He is normal weight. He is not ill-appearing, toxic-appearing or diaphoretic.  HENT:     Head: Normocephalic  and atraumatic.  Musculoskeletal:     Lumbar back: Negative right straight leg raise test and negative left straight leg raise test.     Right lower leg: No edema.     Left lower leg: No edema.  Skin:    Capillary Refill: Capillary refill takes less than 2 seconds.     Coloration: Skin is not jaundiced or pale.     Findings: No bruising, erythema, lesion or rash.  Neurological:     Mental Status: He is alert and oriented to person, place, and time. Mental status is at baseline.     Sensory: No sensory deficit.     Motor: No weakness.     Gait: Gait abnormal.     Deep Tendon Reflexes: Reflexes normal.    Back Exam   Tenderness  The patient is experiencing tenderness in the lumbar (Tenderness on the right lower back none in the midline from the C-spine to the lumbar spine).  Muscle Strength  Right Quadriceps:  5/5  Left Quadriceps:  5/5  Right Hamstrings:  5/5  Left Hamstrings:  5/5   Tests  Straight leg raise right: negative Straight leg raise left: negative  Reflexes   Patellar:  normal Achilles:  normal  Other  Gait: abnormal  Erythema: no back redness  Comments:  Positive femoral nerve stretch test     Specialty Comments:  No specialty comments available.  Imaging: No results found.   PMFS History: Patient Active Problem List   Diagnosis Date Noted  . Hyperlipidemia 11/26/2011  . Gout 11/26/2011  . Low testosterone 11/26/2011  . H/O prostate cancer 05/26/2007   Past Medical History:  Diagnosis Date  . Cancer    prostate  . Gout     Family History  Problem Relation Age of Onset  . Cancer Mother        lung  . Cancer Father        prostate ca  . Cirrhosis Father        non-alcoholic  . Cancer Maternal Grandfather        Prostate  . Colon cancer Neg Hx   . Colon polyps Neg Hx   . Esophageal cancer Neg Hx   . Rectal cancer Neg Hx   . Stomach cancer Neg Hx     Past Surgical History:  Procedure Laterality Date  . BICEPS TENDON REPAIR    . PROSTATE SURGERY    . SHOULDER OPEN ROTATOR CUFF REPAIR Left 04/29/2019   Procedure: OPEN ROTATOR CUFF REPAIR LEFT SHOULDER;  Surgeon: Vickki Hearing, MD;  Location: [facility];  Service: Orthopedics;  Laterality: Left;  and interscalene block    Social History   Occupational History  . 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 Occupation: IT sales professional  Tobacco Use  . Smoking status: Never  . Smokeless tobacco: Never  Vaping Use  . Vaping Use: Never used  Substance and Sexual Activity  . Alcohol use: No  . Drug use: No  . Sexual activity: Yes

## 2022-05-15 ENCOUNTER — Ambulatory Visit: Payer: 59 | Admitting: Orthopedic Surgery

## 2022-05-15 ENCOUNTER — Encounter: Payer: Self-pay | Admitting: Orthopedic Surgery

## 2022-05-15 ENCOUNTER — Other Ambulatory Visit (INDEPENDENT_AMBULATORY_CARE_PROVIDER_SITE_OTHER): Payer: 59

## 2022-05-15 VITALS — BP 156/90 | HR 102 | Ht 73.0 in | Wt 244.5 lb

## 2022-05-15 DIAGNOSIS — M5136 Other intervertebral disc degeneration, lumbar region: Secondary | ICD-10-CM | POA: Diagnosis not present

## 2022-05-15 DIAGNOSIS — M5441 Lumbago with sciatica, right side: Secondary | ICD-10-CM | POA: Diagnosis not present

## 2022-05-15 DIAGNOSIS — M5126 Other intervertebral disc displacement, lumbar region: Secondary | ICD-10-CM

## 2022-05-15 NOTE — Progress Notes (Addendum)
Orthopedic Spine Surgery Office Note  Assessment: Patient is a 62 y.o. male with radiating right lower extremity pain.  Has a L4/5 disc herniation on the right side causing radiculopathy   Plan: -Explained that initially conservative treatment is tried as a significant number of patients may experience relief with these treatment modalities. Discussed that the conservative treatments include:  -activity modification  -physical therapy  -over the counter pain medications  -medrol dosepak  -lumbar steroid injections -Patient has tried NSAIDs, Tylenol, Medrol Dosepak, muscle relaxer, oxycodone  -Explained the natural history of disc herniations and that the majority of people do get better with conservative treatment -Recommended trial of a diagnostic and therapeutic L4/5 injection, referral provided today -Prescribed oxycodone for pain relief -Patient should return to office in 5 weeks, x-rays at next visit: None   Patient expressed understanding of the plan and all questions were answered to the patient's satisfaction.   ___________________________________________________________________________   History:  Patient is a 62 y.o. male who presents today for lumbar spine.  Patient has had 1 week of pain radiating down the right lower extremity.  He feels the pain in the anterolateral thigh, the anterolateral leg, and it goes into the dorsal foot.  He does not have any left sided symptoms.  There was no trauma or injury that preceded the onset of pain.  Pain is worse if he is laying flat or goes from a seated to standing position.  It is more tolerable if he is sitting down.  He has not had pain like this before.  Denies numbness and paresthesias.   Weakness: Denies Symptoms of imbalance: Denies Paresthesias and numbness: Denies Bowel or bladder incontinence: Denies Saddle anesthesia: Denies  Treatments tried: NSAIDs, Tylenol, Medrol Dosepak, muscle relaxer, oxycodone  Review of  systems: Denies fevers and chills, night sweats, unexplained weight loss.  Has had pain that wakes him at night and has a history of prostate cancer  Past medical history: Hyperlipidemia Prostate cancer  Allergies: Crestor  Past surgical history:  Left shoulder rotator cuff repair Left eye tear duct surgery Left bicep tendon repair Prostate cancer excision  Social history: Denies use of nicotine product (smoking, vaping, patches, smokeless) Alcohol use: denies Denies recreational drug use   Physical Exam:  BMI of 32.3  General: no acute distress, appears stated age Neurologic: alert, answering questions appropriately, following commands Respiratory: unlabored breathing on room air, symmetric chest rise Psychiatric: appropriate affect, normal cadence to speech   MSK (spine):  -Strength exam      Left  Right EHL    5/5  5/5 TA    5/5  5/5 GSC    5/5  5/5 Knee extension  5/5  5/5 Hip flexion   5/5  5/5  -Sensory exam    Sensation intact to light touch in L3-S1 nerve distributions of bilateral lower extremities  -Achilles DTR: 2/4 on the left, 2/4 on the right -Patellar tendon DTR: 2/4 on the left, 2/4 on the right  -Straight leg raise: Negative -Contralateral straight leg raise: Negative -Femoral nerve stretch test: Negative bilaterally -Clonus: no beats bilaterally  -Left hip exam: No pain through range of motion, negative Stinchfield, negative FABER -Right hip exam: No pain through range of motion, negative Stinchfield, negative FABER  Imaging: XR of the lumbar spine from 05/15/2022 was independently reviewed and interpreted, showing disc height loss at L4/5 and L5/S1.  No fracture or dislocation seen.  No evidence of instability on flexion/extension views.  MRI of the lumbar spine  from 05/13/2022 was independently reviewed and interpreted, showing paracentral disc herniation at L4/5 on the right side.  Small broad-based left extraforaminal disc herniation at  L5/S1.  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  DDD at L3/4, L4/5, L5/S1.   Patient name: Adam Cunningham. Patient MRN: [id number] Date of visit: 05/15/22

## 2022-05-16 ENCOUNTER — Encounter: Payer: Self-pay | Admitting: Orthopedic Surgery

## 2022-05-17 ENCOUNTER — Other Ambulatory Visit (HOSPITAL_COMMUNITY): Payer: Self-pay

## 2022-05-17 MED ORDER — OXYCODONE HCL 5 MG PO TABS
5.0000 mg | ORAL_TABLET | ORAL | 0 refills | Status: DC | PRN
  Filled 2022-05-17 (×2): qty 20, 4d supply, fill #0

## 2022-05-17 NOTE — Addendum Note (Signed)
Addended by: Willia Craze on: 05/17/2022 08:51 AM   Modules accepted: Orders, Level of Service

## 2022-05-21 ENCOUNTER — Ambulatory Visit: Payer: 59 | Admitting: Physical Medicine and Rehabilitation

## 2022-05-21 ENCOUNTER — Other Ambulatory Visit: Payer: Self-pay

## 2022-05-21 ENCOUNTER — Other Ambulatory Visit (HOSPITAL_COMMUNITY): Payer: Self-pay

## 2022-05-21 VITALS — BP 124/81 | HR 102

## 2022-05-21 DIAGNOSIS — M5416 Radiculopathy, lumbar region: Secondary | ICD-10-CM | POA: Diagnosis not present

## 2022-05-21 MED ORDER — METHYLPREDNISOLONE ACETATE 80 MG/ML IJ SUSP
80.0000 mg | Freq: Once | INTRAMUSCULAR | Status: AC
  Administered 2022-05-21: 80 mg

## 2022-05-21 MED ORDER — OXYCODONE HCL 5 MG PO TABS
5.0000 mg | ORAL_TABLET | ORAL | 0 refills | Status: AC | PRN
  Filled 2022-05-23: qty 20, 4d supply, fill #0

## 2022-05-21 NOTE — Progress Notes (Signed)
Adam Cunningham. - 62 y.o. male MRN [id number]  Date of birth: 06-24-1960  Office Visit Note: Visit Date: 05/21/2022 PCP: Shade Flood, MD Referred by: London Sheer, MD  Subjective: Chief Complaint  Patient presents with   Lower Back - Pain   HPI:  Adam Cunningham. is a 62 y.o. male who comes in today at the request of Dr. Willia Craze for planned Right L4-5 Lumbar Transforaminal epidural steroid injection with fluoroscopic guidance.  The patient has failed conservative care including home exercise, medications, time and activity modification.  This injection will be diagnostic and hopefully therapeutic.  Please see requesting physician notes for further details and justification.   ROS Otherwise per HPI.  Assessment & Plan: Visit Diagnoses:    ICD-10-CM   1. Lumbar radiculopathy  M54.16 XR C-ARM NO REPORT    Epidural Steroid injection    methylPREDNISolone acetate (DEPO-MEDROL) injection 80 mg      Plan: No additional findings.   Meds & Orders:  Meds ordered this encounter  Medications   methylPREDNISolone acetate (DEPO-MEDROL) injection 80 mg    Orders Placed This Encounter  Procedures   XR C-ARM NO REPORT   Epidural Steroid injection    Follow-up: Return for visit to requesting provider as needed.   Procedures: No procedures performed  Lumbosacral Transforaminal Epidural Steroid Injection - Sub-Pedicular Approach with Fluoroscopic Guidance  Patient: Adam Cunningham.      Date of Birth: 10/14/1960 MRN: [id number] PCP: Shade Flood, MD      Visit Date: 05/21/2022   Universal Protocol:    Date/Time: 05/21/2022  Consent Given By: the patient  Position: PRONE  Additional Comments: Vital signs were monitored before and after the procedure. Patient was prepped and draped in the usual sterile fashion. The correct patient, procedure, and site was verified.   Injection Procedure Details:   Procedure diagnoses: Lumbar  radiculopathy [M54.16]    Meds Administered:  Meds ordered this encounter  Medications   methylPREDNISolone acetate (DEPO-MEDROL) injection 80 mg    Laterality: Right  Location/Site: L4  Needle:5.0 in., 22 ga.  Short bevel or Quincke spinal needle  Needle Placement: Transforaminal  Findings:    -Comments: Excellent flow of contrast along the nerve, nerve root and into the epidural space.  Procedure Details: After squaring off the end-plates to get a true AP view, the C-arm was positioned so that an oblique view of the foramen as noted above was visualized. The target area is just inferior to the "nose of the scotty dog" or sub pedicular. The soft tissues overlying this structure were infiltrated with 2-3 ml. of 1% Lidocaine without Epinephrine.  The spinal needle was inserted toward the target using a "trajectory" view along the fluoroscope beam.  Under AP and lateral visualization, the needle was advanced so it did not puncture dura and was located close the 6 O'Clock position of the pedical in AP tracterory. Biplanar projections were used to confirm position. Aspiration was confirmed to be negative for CSF and/or blood. A 1-2 ml. volume of Isovue-250 was injected and flow of contrast was noted at each level. Radiographs were obtained for documentation purposes.   After attaining the desired flow of contrast documented above, a 0.5 to 1.0 ml test dose of 0.25% Marcaine was injected into each respective transforaminal space.  The patient was observed for 90 seconds post injection.  After no sensory deficits were reported, and normal lower extremity motor function was noted,  the above injectate was administered so that equal amounts of the injectate were placed at each foramen (level) into the transforaminal epidural space.   Additional Comments:  No complications occurred Dressing: 2 x 2 sterile gauze and Band-Aid    Post-procedure details: Patient was observed during the  procedure. Post-procedure instructions were reviewed.  Patient left the clinic in stable condition.    Clinical History: CLINICAL DATA:  Initial evaluation for low back pain, radiation into the left lower extremity, increased fracture risk.   EXAM: MRI LUMBAR SPINE WITHOUT CONTRAST   TECHNIQUE: Multiplanar, multisequence MR imaging of the lumbar spine was performed. No intravenous contrast was administered.   COMPARISON:  Prior radiograph from 09/06/2019.   FINDINGS: Segmentation: Standard. Lowest well-formed disc space labeled the L5-S1 level.   Alignment: Straightening of the normal lumbar lordosis. 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   Vertebrae: Vertebral body height maintained without acute or chronic fracture. Bone marrow signal intensity within normal limits. Benign hemangioma partially visualize within the T12 vertebral body. No worrisome osseous lesions. Discogenic reactive endplate change with marrow edema present about the L4-5 interspace. No other significant abnormal marrow edema.   Conus medullaris and cauda equina: Conus extends to the T12-L1 level. Conus and cauda equina appear normal.   Paraspinal and other soft tissues: Paraspinous soft tissues within normal limits.   Disc levels:   T12-L1: Disc desiccation with mild disc bulge and endplate spurring. Mild facet hypertrophy. No significant canal or foraminal stenosis.   L1-2: Diffuse disc bulge with disc desiccation and endplate spurring. Mild facet hypertrophy. No spinal stenosis. Foramina remain patent.   L2-3: Disc desiccation with mild disc bulge. Mild facet hypertrophy. No significant spinal stenosis. Foramina remain patent.   L3-4: Degenerative intervertebral disc space narrowing with diffuse disc bulge and disc desiccation. Mild reactive endplate spurring. Superimposed small left foraminal disc protrusion contacts the exiting left L3  nerve root (series 14, image 22). Mild bilateral facet hypertrophy. No significant spinal stenosis. Mild bilateral L3 foraminal narrowing.   L4-5: Degenerative intervertebral disc space narrowing with diffuse disc bulge and disc desiccation. Superimposed right subarticular to foraminal disc protrusion with slight superior migration (series 14, images 28, 27). Disc material contacts both the exiting right L4 and descending L5 nerve roots, either which could be affected. Mild to moderate right worse than left facet hypertrophy. Resultant moderate right subarticular stenosis. Central canal remains patent. Mild left with moderate right L4 foraminal stenosis.   L5-S1: Trace anterolisthesis. Disc desiccation with diffuse disc bulge and reactive endplate spurring. Superimposed broad-based left extraforaminal disc protrusion closely approximates and/or contacts the exiting left L5 nerve root (series 14, image 33). Moderate right with mild left facet arthrosis with chronic unilateral right-sided pars defect. Mild narrowing of the right lateral recess. Central canal remains patent. Mild bilateral L5 foraminal stenosis.   IMPRESSION: 1. No acute or recent fracture within the lumbar spine. 2. Small left foraminal disc protrusion at L3-4, contacting and potentially irritating the exiting left L3 nerve root. 3. Right subarticular to foraminal disc protrusion at L4-5, contacting both the exiting right L4 and descending L5 nerve roots, either of which could be affected. 4. Broad-based left extraforaminal disc protrusion at L5-S1, closely approximating the exiting left L5 nerve root. 5. 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     Electronically Signed   By: Rise Mu M.D.   On: 05/13/2022 20:51     Objective:  VS:  HT:    WT:   BMI:     BP:124/81  HR:(!) 102bpm  TEMP: ( )  RESP:  Physical Exam Vitals and nursing note reviewed.   Constitutional:      General: He is not in acute distress.    Appearance: Normal appearance. He is not ill-appearing.  HENT:     Head: Normocephalic and atraumatic.     Right Ear: External ear normal.     Left Ear: External ear normal.     Nose: No congestion.  Eyes:     Extraocular Movements: Extraocular movements intact.  Cardiovascular:     Rate and Rhythm: Normal rate.     Pulses: Normal pulses.  Pulmonary:     Effort: Pulmonary effort is normal. No respiratory distress.  Abdominal:     General: There is no distension.     Palpations: Abdomen is soft.  Musculoskeletal:        General: No tenderness or signs of injury.     Cervical back: Neck supple.     Right lower leg: No edema.     Left lower leg: No edema.     Comments: Patient has good distal strength without clonus.  Skin:    Findings: No erythema or rash.  Neurological:     General: No focal deficit present.     Mental Status: He is alert and oriented to person, place, and time.     Sensory: No sensory deficit.     Motor: No weakness or abnormal muscle tone.     Coordination: Coordination normal.  Psychiatric:        Mood and Affect: Mood normal.        Behavior: Behavior normal.      Imaging: No results found.

## 2022-05-21 NOTE — Patient Instructions (Signed)

## 2022-05-21 NOTE — Progress Notes (Signed)
Functional Pain Scale - descriptive words and definitions  Distressing (6)    Pain is present/unable to complete most ADLs limited by pain/sleep is difficult and active distraction is only marginal. Moderate range order  Average Pain 8   +Driver, -BT, -Dye Allergies.  Lower back pain on right that radiates into the right leg down to the top of the foot

## 2022-05-21 NOTE — Procedures (Signed)
Lumbosacral Transforaminal Epidural Steroid Injection - Sub-Pedicular Approach with Fluoroscopic Guidance  Patient: Adam Cunningham.      Date of Birth: [birth year] MRN: [id number] PCP: Shade Flood, MD      Visit Date: 05/21/2022   Universal Protocol:    Date/Time: 05/21/2022  Consent Given By: the patient  Position: PRONE  Additional Comments: Vital signs were monitored before and after the procedure. Patient was prepped and draped in the usual sterile fashion. The correct patient, procedure, and site was verified.   Injection Procedure Details:   Procedure diagnoses: Lumbar radiculopathy [M54.16]    Meds Administered:  Meds ordered this encounter  Medications   methylPREDNISolone acetate (DEPO-MEDROL) injection 80 mg    Laterality: Right  Location/Site: L4  Needle:5.0 in., 22 ga.  Short bevel or Quincke spinal needle  Needle Placement: Transforaminal  Findings:    -Comments: Excellent flow of contrast along the nerve, nerve root and into the epidural space.  Procedure Details: After squaring off the end-plates to get a true AP view, the C-arm was positioned so that an oblique view of the foramen as noted above was visualized. The target area is just inferior to the "nose of the scotty dog" or sub pedicular. The soft tissues overlying this structure were infiltrated with 2-3 ml. of 1% Lidocaine without Epinephrine.  The spinal needle was inserted toward the target using a "trajectory" view along the fluoroscope beam.  Under AP and lateral visualization, the needle was advanced so it did not puncture dura and was located close the 6 O'Clock position of the pedical in AP tracterory. Biplanar projections were used to confirm position. Aspiration was confirmed to be negative for CSF and/or blood. A 1-2 ml. volume of Isovue-250 was injected and flow of contrast was noted at each level. Radiographs were obtained for documentation purposes.   After attaining the  desired flow of contrast documented above, a 0.5 to 1.0 ml test dose of 0.25% Marcaine was injected into each respective transforaminal space.  The patient was observed for 90 seconds post injection.  After no sensory deficits were reported, and normal lower extremity motor function was noted,   the above injectate was administered so that equal amounts of the injectate were placed at each foramen (level) into the transforaminal epidural space.   Additional Comments:  No complications occurred Dressing: 2 x 2 sterile gauze and Band-Aid    Post-procedure details: Patient was observed during the procedure. Post-procedure instructions were reviewed.  Patient left the clinic in stable condition.

## 2022-05-22 ENCOUNTER — Other Ambulatory Visit (HOSPITAL_COMMUNITY): Payer: Self-pay

## 2022-05-23 ENCOUNTER — Ambulatory Visit: Payer: 59 | Admitting: Orthopedic Surgery

## 2022-05-23 ENCOUNTER — Other Ambulatory Visit (HOSPITAL_COMMUNITY): Payer: Self-pay

## 2022-05-23 ENCOUNTER — Other Ambulatory Visit: Payer: Self-pay

## 2022-05-30 ENCOUNTER — Other Ambulatory Visit (HOSPITAL_COMMUNITY): Payer: Self-pay

## 2022-05-30 ENCOUNTER — Other Ambulatory Visit: Payer: Self-pay

## 2022-05-30 MED ORDER — DICLOFENAC SODIUM 75 MG PO TBEC
75.0000 mg | DELAYED_RELEASE_TABLET | Freq: Two times a day (BID) | ORAL | 0 refills | Status: AC
  Filled 2022-05-30: qty 60, 30d supply, fill #0

## 2022-05-30 NOTE — Addendum Note (Signed)
Addended by: Willia Craze on: 05/30/2022 08:04 AM   Modules accepted: Orders

## 2022-06-02 ENCOUNTER — Encounter: Payer: Self-pay | Admitting: Physical Medicine and Rehabilitation

## 2022-06-03 ENCOUNTER — Other Ambulatory Visit: Payer: Self-pay | Admitting: Physical Medicine and Rehabilitation

## 2022-06-03 DIAGNOSIS — M5416 Radiculopathy, lumbar region: Secondary | ICD-10-CM

## 2022-06-07 ENCOUNTER — Encounter: Payer: Self-pay | Admitting: Orthopedic Surgery

## 2022-06-07 DIAGNOSIS — M5441 Lumbago with sciatica, right side: Secondary | ICD-10-CM

## 2022-06-19 ENCOUNTER — Ambulatory Visit: Payer: 59 | Admitting: Orthopedic Surgery

## 2022-06-19 ENCOUNTER — Other Ambulatory Visit: Payer: Self-pay

## 2022-06-19 ENCOUNTER — Ambulatory Visit: Payer: 59 | Admitting: Physical Medicine and Rehabilitation

## 2022-06-19 VITALS — BP 145/89 | HR 75

## 2022-06-19 DIAGNOSIS — M5416 Radiculopathy, lumbar region: Secondary | ICD-10-CM | POA: Diagnosis not present

## 2022-06-19 DIAGNOSIS — M5116 Intervertebral disc disorders with radiculopathy, lumbar region: Secondary | ICD-10-CM

## 2022-06-19 MED ORDER — METHYLPREDNISOLONE ACETATE 80 MG/ML IJ SUSP
80.0000 mg | Freq: Once | INTRAMUSCULAR | Status: AC
Start: 2022-06-19 — End: 2022-06-19
  Administered 2022-06-19: 80 mg

## 2022-06-19 MED ORDER — METHOCARBAMOL 750 MG PO TABS
750.0000 mg | ORAL_TABLET | Freq: Three times a day (TID) | ORAL | 1 refills | Status: AC | PRN
Start: 2022-06-19 — End: ?
  Filled 2022-06-19: qty 60, 20d supply, fill #0
  Filled 2023-02-07 (×2): qty 60, 20d supply, fill #1

## 2022-06-19 NOTE — Patient Instructions (Signed)

## 2022-06-19 NOTE — Progress Notes (Unsigned)
Functional Pain Scale - descriptive words and definitions  Moderate (4)   Constantly aware of pain, can complete ADLs with modification/sleep marginally affected at times/passive distraction is of no use, but active distraction gives some relief. Moderate range order  Average Pain  varies on how long standing up   +Driver, -BT, -Dye Allergies.  Lower back pain on the right side that radiates into the side of the right leg

## 2022-06-20 ENCOUNTER — Other Ambulatory Visit: Payer: Self-pay

## 2022-06-20 NOTE — Procedures (Signed)
Lumbosacral Transforaminal Epidural Steroid Injection - Sub-Pedicular Approach with Fluoroscopic Guidance  Patient: Adam Cunningham.      Date of Birth: 12-Mar-1960 MRN: [id number] PCP: Shade Flood, MD      Visit Date: 06/19/2022   Universal Protocol:    Date/Time: 06/19/2022  Consent Given By: the patient  Position: PRONE  Additional Comments: Vital signs were monitored before and after the procedure. Patient was prepped and draped in the usual sterile fashion. The correct patient, procedure, and site was verified.   Injection Procedure Details:   Procedure diagnoses: Lumbar radiculopathy [M54.16]    Meds Administered:  Meds ordered this encounter  Medications   methylPREDNISolone acetate (DEPO-MEDROL) injection 80 mg    Laterality: Right  Location/Site: L5  Needle:5.0 in., 22 ga.  Short bevel or Quincke spinal needle  Needle Placement: Transforaminal  Findings:    -Comments: Excellent flow of contrast along the nerve, nerve root and into the epidural space.  Procedure Details: After squaring off the end-plates to get a true AP view, the C-arm was positioned so that an oblique view of the foramen as noted above was visualized. The target area is just inferior to the "nose of the scotty dog" or sub pedicular. The soft tissues overlying this structure were infiltrated with 2-3 ml. of 1% Lidocaine without Epinephrine.  The spinal needle was inserted toward the target using a "trajectory" view along the fluoroscope beam.  Under AP and lateral visualization, the needle was advanced so it did not puncture dura and was located close the 6 O'Clock position of the pedical in AP tracterory. Biplanar projections were used to confirm position. Aspiration was confirmed to be negative for CSF and/or blood. A 1-2 ml. volume of Isovue-250 was injected and flow of contrast was noted at each level. Radiographs were obtained for documentation purposes.   After attaining the  desired flow of contrast documented above, a 0.5 to 1.0 ml test dose of 0.25% Marcaine was injected into each respective transforaminal space.  The patient was observed for 90 seconds post injection.  After no sensory deficits were reported, and normal lower extremity motor function was noted,   the above injectate was administered so that equal amounts of the injectate were placed at each foramen (level) into the transforaminal epidural space.   Additional Comments:  No complications occurred Dressing: 2 x 2 sterile gauze and Band-Aid    Post-procedure details: Patient was observed during the procedure. Post-procedure instructions were reviewed.  Patient left the clinic in stable condition.

## 2022-06-20 NOTE — Progress Notes (Signed)
Adam Cunningham. - 62 y.o. male MRN [id number]  Date of birth: 1960/12/31  Office Visit Note: Visit Date: 06/19/2022 PCP: Shade Flood, MD Referred by: London Sheer, MD  Subjective: Chief Complaint  Patient presents with   Lower Back - Pain   HPI:  Adam Cunningham. is a 62 y.o. male who comes in today at the request of Dr. Willia Craze for planned Right L5-S1 Lumbar Transforaminal epidural steroid injection with fluoroscopic guidance.  The patient has failed conservative care including home exercise, medications, time and activity modification.  This injection will be diagnostic and hopefully therapeutic.  Please see requesting physician notes for further details and justification.   ROS Otherwise per HPI.  Assessment & Plan: Visit Diagnoses:    ICD-10-CM   1. Lumbar radiculopathy  M54.16 XR C-ARM NO REPORT    Epidural Steroid injection    methylPREDNISolone acetate (DEPO-MEDROL) injection 80 mg    2. Radiculopathy due to lumbar intervertebral disc disorder  M51.16       Plan: No additional findings.   Meds & Orders:  Meds ordered this encounter  Medications   methylPREDNISolone acetate (DEPO-MEDROL) injection 80 mg    Orders Placed This Encounter  Procedures   XR C-ARM NO REPORT   Epidural Steroid injection    Follow-up: No follow-ups on file.   Procedures: No procedures performed  Lumbosacral Transforaminal Epidural Steroid Injection - Sub-Pedicular Approach with Fluoroscopic Guidance  Patient: Adam Cunningham.      Date of Birth: 1960-07-29 MRN: [id number] PCP: Shade Flood, MD      Visit Date: 06/19/2022   Universal Protocol:    Date/Time: 06/19/2022  Consent Given By: the patient  Position: PRONE  Additional Comments: Vital signs were monitored before and after the procedure. Patient was prepped and draped in the usual sterile fashion. The correct patient, procedure, and site was verified.   Injection Procedure  Details:   Procedure diagnoses: Lumbar radiculopathy [M54.16]    Meds Administered:  Meds ordered this encounter  Medications   methylPREDNISolone acetate (DEPO-MEDROL) injection 80 mg    Laterality: Right  Location/Site: L5  Needle:5.0 in., 22 ga.  Short bevel or Quincke spinal needle  Needle Placement: Transforaminal  Findings:    -Comments: Excellent flow of contrast along the nerve, nerve root and into the epidural space.  Procedure Details: After squaring off the end-plates to get a true AP view, the C-arm was positioned so that an oblique view of the foramen as noted above was visualized. The target area is just inferior to the "nose of the scotty dog" or sub pedicular. The soft tissues overlying this structure were infiltrated with 2-3 ml. of 1% Lidocaine without Epinephrine.  The spinal needle was inserted toward the target using a "trajectory" view along the fluoroscope beam.  Under AP and lateral visualization, the needle was advanced so it did not puncture dura and was located close the 6 O'Clock position of the pedical in AP tracterory. Biplanar projections were used to confirm position. Aspiration was confirmed to be negative for CSF and/or blood. A 1-2 ml. volume of Isovue-250 was injected and flow of contrast was noted at each level. Radiographs were obtained for documentation purposes.   After attaining the desired flow of contrast documented above, a 0.5 to 1.0 ml test dose of 0.25% Marcaine was injected into each respective transforaminal space.  The patient was observed for 90 seconds post injection.  After no sensory deficits were reported,  and normal lower extremity motor function was noted,   the above injectate was administered so that equal amounts of the injectate were placed at each foramen (level) into the transforaminal epidural space.   Additional Comments:  No complications occurred Dressing: 2 x 2 sterile gauze and Band-Aid    Post-procedure  details: Patient was observed during the procedure. Post-procedure instructions were reviewed.  Patient left the clinic in stable condition.    Clinical History: CLINICAL DATA:  Initial evaluation for low back pain, radiation into the left lower extremity, increased fracture risk.   EXAM: MRI LUMBAR SPINE WITHOUT CONTRAST   TECHNIQUE: Multiplanar, multisequence MR imaging of the lumbar spine was performed. No intravenous contrast was administered.   COMPARISON:  Prior radiograph from 09/06/2019.   FINDINGS: Segmentation: Standard. Lowest well-formed disc space labeled the L5-S1 level.   Alignment: Straightening of the normal lumbar lordosis. 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   Vertebrae: Vertebral body height maintained without acute or chronic fracture. Bone marrow signal intensity within normal limits. Benign hemangioma partially visualize within the T12 vertebral body. No worrisome osseous lesions. Discogenic reactive endplate change with marrow edema present about the L4-5 interspace. No other significant abnormal marrow edema.   Conus medullaris and cauda equina: Conus extends to the T12-L1 level. Conus and cauda equina appear normal.   Paraspinal and other soft tissues: Paraspinous soft tissues within normal limits.   Disc levels:   T12-L1: Disc desiccation with mild disc bulge and endplate spurring. Mild facet hypertrophy. No significant canal or foraminal stenosis.   L1-2: Diffuse disc bulge with disc desiccation and endplate spurring. Mild facet hypertrophy. No spinal stenosis. Foramina remain patent.   L2-3: Disc desiccation with mild disc bulge. Mild facet hypertrophy. No significant spinal stenosis. Foramina remain patent.   L3-4: Degenerative intervertebral disc space narrowing with diffuse disc bulge and disc desiccation. Mild reactive endplate spurring. Superimposed small left foraminal disc  protrusion contacts the exiting left L3 nerve root (series 14, image 22). Mild bilateral facet hypertrophy. No significant spinal stenosis. Mild bilateral L3 foraminal narrowing.   L4-5: Degenerative intervertebral disc space narrowing with diffuse disc bulge and disc desiccation. Superimposed right subarticular to foraminal disc protrusion with slight superior migration (series 14, images 28, 27). Disc material contacts both the exiting right L4 and descending L5 nerve roots, either which could be affected. Mild to moderate right worse than left facet hypertrophy. Resultant moderate right subarticular stenosis. Central canal remains patent. Mild left with moderate right L4 foraminal stenosis.   L5-S1: Trace anterolisthesis. Disc desiccation with diffuse disc bulge and reactive endplate spurring. Superimposed broad-based left extraforaminal disc protrusion closely approximates and/or contacts the exiting left L5 nerve root (series 14, image 33). Moderate right with mild left facet arthrosis with chronic unilateral right-sided pars defect. Mild narrowing of the right lateral recess. Central canal remains patent. Mild bilateral L5 foraminal stenosis.   IMPRESSION: 1. No acute or recent fracture within the lumbar spine. 2. Small left foraminal disc protrusion at L3-4, contacting and potentially irritating the exiting left L3 nerve root. 3. Right subarticular to foraminal disc protrusion at L4-5, contacting both the exiting right L4 and descending L5 nerve roots, either of which could be affected. 4. Broad-based left extraforaminal disc protrusion at L5-S1, closely approximating the exiting left L5 nerve root. 5. 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     Electronically Signed   By: Janell Quiet.D.  On: 05/13/2022 20:51     Objective:  VS:  HT:    WT:   BMI:     BP:(!) 145/89  HR:75bpm  TEMP: ( )  RESP:  Physical  Exam Vitals and nursing note reviewed.  Constitutional:      General: He is not in acute distress.    Appearance: Normal appearance. He is not ill-appearing.  HENT:     Head: Normocephalic and atraumatic.     Right Ear: External ear normal.     Left Ear: External ear normal.     Nose: No congestion.  Eyes:     Extraocular Movements: Extraocular movements intact.  Cardiovascular:     Rate and Rhythm: Normal rate.     Pulses: Normal pulses.  Pulmonary:     Effort: Pulmonary effort is normal. No respiratory distress.  Abdominal:     General: There is no distension.     Palpations: Abdomen is soft.  Musculoskeletal:        General: No tenderness or signs of injury.     Cervical back: Neck supple.     Right lower leg: No edema.     Left lower leg: No edema.     Comments: Patient has good distal strength without clonus.  Skin:    Findings: No erythema or rash.  Neurological:     General: No focal deficit present.     Mental Status: He is alert and oriented to person, place, and time.     Sensory: No sensory deficit.     Motor: No weakness or abnormal muscle tone.     Coordination: Coordination normal.  Psychiatric:        Mood and Affect: Mood normal.        Behavior: Behavior normal.      Imaging: No results found.

## 2022-07-01 ENCOUNTER — Encounter: Payer: Self-pay | Admitting: Family Medicine

## 2022-07-01 ENCOUNTER — Other Ambulatory Visit: Payer: Self-pay

## 2022-07-01 ENCOUNTER — Ambulatory Visit (INDEPENDENT_AMBULATORY_CARE_PROVIDER_SITE_OTHER): Payer: 59 | Admitting: Family Medicine

## 2022-07-01 ENCOUNTER — Other Ambulatory Visit (HOSPITAL_COMMUNITY): Payer: Self-pay

## 2022-07-01 VITALS — BP 116/72 | HR 66 | Temp 98.4°F | Ht 73.0 in | Wt 241.8 lb

## 2022-07-01 DIAGNOSIS — M545 Low back pain, unspecified: Secondary | ICD-10-CM | POA: Diagnosis not present

## 2022-07-01 DIAGNOSIS — H04551 Acquired stenosis of right nasolacrimal duct: Secondary | ICD-10-CM | POA: Diagnosis not present

## 2022-07-01 DIAGNOSIS — Z8669 Personal history of other diseases of the nervous system and sense organs: Secondary | ICD-10-CM

## 2022-07-01 DIAGNOSIS — Z23 Encounter for immunization: Secondary | ICD-10-CM | POA: Diagnosis not present

## 2022-07-01 DIAGNOSIS — M109 Gout, unspecified: Secondary | ICD-10-CM | POA: Diagnosis not present

## 2022-07-01 MED ORDER — ERYTHROMYCIN 5 MG/GM OP OINT
1.0000 | TOPICAL_OINTMENT | Freq: Three times a day (TID) | OPHTHALMIC | 0 refills | Status: AC
Start: 2022-07-01 — End: ?
  Filled 2022-07-01: qty 3.5, 7d supply, fill #0

## 2022-07-01 NOTE — Progress Notes (Signed)
Subjective:  Patient ID: Adam Lint., male    DOB: 1960/08/18  Age: 62 y.o. MRN: [id number]  CC:  Chief Complaint  Patient presents with   Follow-up    Pt had a bulging disk and has since had 2 back injections   Would like discuss if he should still be taking Zetia     HPI Adam Lint. presents for   Recurrent back pain Episodic back pain past treated with hydrocodone, Flexeril indomethacin if needed for flares.  Additionally has taken colchicine in the past that has been helpful.  Has been followed by back specialist, 2 prior injections.  Dr. Alvester Morin, most recent procedure May 29 - epidural steroid injection. Helped. More functional. Adjusting activity including log splitter, log lift.   Hyperlipidemia: Has been evaluated by lipid specialist, Dr. Rennis Golden in April.  Prior coronary calcium score of 104, 63rd percentile in February.  Intolerant to statins.  Did improve diet and had some weight loss when discussed in April, LDL was 107 in January of total 204, triglycerides 328 and HDL 40.  Lipoprotein a was elevated at 82, NMR LipoProfile with elevated triglycerides and cholesterol.  Zetia 10 mg recommended with 3 to 18-month follow-up. Started Zetia- took for a few weeks, felt ok. Better than statins. stopped d/t concerns of bulging disk. Plans to restart.   Lab Results  Component Value Date   CHOL 204 (H) 01/28/2022   HDL 40.20 01/28/2022   LDLCALC 113 (H) 01/25/2020   LDLDIRECT 107.0 01/28/2022   TRIG 328.0 (H) 01/28/2022   CHOLHDL 5 01/28/2022   Lab Results  Component Value Date   ALT 21 01/28/2022   AST 19 01/28/2022   ALKPHOS 52 01/28/2022   BILITOT 0.7 01/28/2022   Gout: Last flare:none recent.  Daily meds: Allopurinol 300 mg daily Prn med: Colchicine or indomethacin, hydrocodone if needed. Has available.  Lab Results  Component Value Date   LABURIC 5.7 01/08/2019   Erectile dysfunction Treated with Cialis 20 mg about once per week in past - not  needing recently.  Minimal headache previously but no other side effects.  Specifically no chest pain, dyspnea with exertion or intercourse, no vision or hearing changes.  Eye concerns: Tear duct issues in past, prior procedure to right eye about 10 years ago. Possibly recurrence recently - few weeks ago. Itchy, watery. R eye only.  No visual changes, no discolored d/c. Has used e-mycin ointment in past when flared or irritated   Health maintenance: COVID booster: 2 boosters - recommended flu and covid booster in fall.  Tdap: updated today.   Weight gain but now losing.  Wt Readings from Last 3 Encounters:  07/01/22 241 lb 12.8 oz (109.7 kg)  05/15/22 244 lb 8 oz (110.9 kg)  05/13/22 233 lb (105.7 kg)     daughter Corrie Dandy is Associate Professor at [employer].  Samantha rising 11th grader at [employer] at [employer].     Immunization History  Administered Date(s) Administered   Influenza Split 12/09/2011   Influenza,inj,Quad PF,6+ Mos 12/08/2012, 12/14/2013, 12/27/2015, 01/07/2017, 01/06/2018, 11/05/2018, 01/25/2020, 11/24/2020, 01/28/2022   Influenza-Unspecified 11/22/2014   Moderna Sars-Covid-2 Vaccination 01/30/2019, 02/27/2019, 12/23/2019, 12/07/2020   Tdap 10/25/2010   Zoster Recombinat (Shingrix) 01/08/2019, 04/14/2019    History Patient Active Problem List   Diagnosis Date Noted   Hyperlipidemia 11/26/2011   Gout 11/26/2011   Low testosterone 11/26/2011   H/O prostate cancer 05/26/2007   Past Medical History:  Diagnosis Date   Cancer (HCC)  prostate   Gout    Past Surgical History:  Procedure Laterality Date   BICEPS TENDON REPAIR     PROSTATE SURGERY     SHOULDER OPEN ROTATOR CUFF REPAIR Left 04/29/2019   Procedure: OPEN ROTATOR CUFF REPAIR LEFT SHOULDER;  Surgeon: Vickki Hearing, MD;  Location: [facility];  Service: Orthopedics;  Laterality: Left;  and interscalene block    Allergies  Allergen Reactions   Crestor [Rosuvastatin Calcium] Other (See Comments)    Myalgias,  fatigue.    Prior to Admission medications   Medication Sig Start Date End Date Taking? Authorizing Provider  allopurinol (ZYLOPRIM) 300 MG tablet Take 1 tablet (300 mg total) by mouth daily. 01/28/22  Yes Shade Flood, MD  colchicine 0.6 MG tablet Take 1 tablet (0.6 mg total) by mouth daily as needed (gout flare ups). 01/28/22  Yes Shade Flood, MD  gabapentin (NEURONTIN) 300 MG capsule Take 1 capsule (300 mg total) by mouth 3 (three) times daily. 05/13/22  Yes Vickki Hearing, MD  ibuprofen (ADVIL) 800 MG tablet Take 1 tablet (800 mg total) by mouth every 8 (eight) hours as needed. 04/29/19  Yes Vickki Hearing, MD  methocarbamol (ROBAXIN) 750 MG tablet Take 1 tablet (750 mg total) by mouth every 8 (eight) hours as needed (muscle spasms, pain). 06/19/22  Yes London Sheer, MD  predniSONE (STERAPRED UNI-PAK 48 TAB) 10 MG (48) TBPK tablet Take by mouth daily. 05/13/22  Yes Vickki Hearing, MD  tadalafil (CIALIS) 20 MG tablet TAKE 1/2-1 TABLET BY MOUTH EVERY OTHER DAY AS NEEDED FOR ERECTILE DYSFUNCTION 01/28/22 01/28/23 Yes Shade Flood, MD  ezetimibe (ZETIA) 10 MG tablet Take 1 tablet (10 mg total) by mouth daily. 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   Engineer, water   Education: [employer]   Exercise: Yes   Social Determinants of Corporate investment banker Strain: Not  on file  Food Insecurity: Not on file  Transportation Needs: Not on file  Physical Activity: Not on file  Stress: Not on file  Social Connections: Not on file  Intimate Partner Violence: Not on file    Review of Systems   Objective:   Vitals:   07/01/22 1010  Weight: 241 lb 12.8 oz (109.7 kg)  Height: 6\' 1"  (1.854 m)     Physical Exam Vitals reviewed.  Constitutional:      Appearance: He is well-developed.  HENT:     Head: Normocephalic and atraumatic.  Eyes:     General:        Right eye: No discharge.        Left eye: No discharge.     Extraocular Movements: Extraocular movements intact.     Conjunctiva/sclera: Conjunctivae normal.  Pupils: Pupils are equal, round, and reactive to light.  Neck:     Vascular: No carotid bruit or JVD.  Cardiovascular:     Rate and Rhythm: Normal rate and regular rhythm.     Heart sounds: Normal heart sounds. No murmur heard. Pulmonary:     Effort: Pulmonary effort is normal.     Breath sounds: Normal breath sounds. No rales.  Musculoskeletal:     Right lower leg: No edema.     Left lower leg: No edema.  Skin:    General: Skin is warm and dry.  Neurological:     Mental Status: He is alert and oriented to person, place, and time.  Psychiatric:        Mood and Affect: Mood normal.        Assessment & Plan:  Adam Debevec. is a 62 y.o. male . Acquired obstruction of right nasolacrimal duct  -With intermittent flares, recommend he follow-up with ophthalmology, but if discolored discharge, or significant erythema start erythromycin ointment for conjunctivitis.  RTC precautions. History of bacterial conjunctivitis - Plan: erythromycin ophthalmic ointment  Need for Tdap vaccination - Plan: Tdap vaccine greater than or equal to 7yo IM  Gout, unspecified cause, unspecified chronicity, unspecified site  -Stable, continue same regimen acute treatments available if needed  Recurrent low back pain  -Improvement after  injection, continue follow-up with specialist.  Has acute pain meds if needed.  Meds ordered this encounter  Medications   erythromycin ophthalmic ointment    Sig: Place 1 Application into the right eye 3 (three) times daily. For 1 week for conjunctivitis/infection.    Dispense:  3.5 g    Refill:  0   Patient Instructions  Ok to restart Zetia, follow up with Dr. Rennis Golden in next 2-3 months for repeat testing and med discussion.  I ordered erythromycin ointment if you have symptoms of an eye infection such as redness, discolored discharge or irritation.  However I would discuss the nasolacrimal duct blockage with your eye specialist.  If it is just a simple watering of the eye without signs of infection I would not recommend using erythromycin at that time. Keep follow-up with back specialist.  Glad to hear the recent injection helped.  No med changes for now.  Take care!     Signed,   Meredith Staggers, MD [facility], [facility] 07/01/22 10:17 AM

## 2022-07-01 NOTE — Patient Instructions (Addendum)
Ok to restart Zetia, follow up with Dr. Rennis Golden in next 2-3 months for repeat testing and med discussion.  I ordered erythromycin ointment if you have symptoms of an eye infection such as redness, discolored discharge or irritation.  However I would discuss the nasolacrimal duct blockage with your eye specialist.  If it is just a simple watering of the eye without signs of infection I would not recommend using erythromycin at that time. Keep follow-up with back specialist.  Glad to hear the recent injection helped.  No med changes for now.  Take care!

## 2022-07-11 ENCOUNTER — Ambulatory Visit: Payer: 59 | Admitting: Orthopedic Surgery

## 2022-07-11 DIAGNOSIS — M5416 Radiculopathy, lumbar region: Secondary | ICD-10-CM | POA: Diagnosis not present

## 2022-07-11 NOTE — Progress Notes (Signed)
Orthopedic Spine Surgery Office Note   Assessment: Patient is a 62 y.o. male with radiating right lower extremity pain.  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 Discussed that the conservative treatments include:             -activity modification             -physical therapy             -over the counter pain medications             -medrol dosepak             -lumbar steroid injections -Patient has tried NSAIDs, Tylenol, Medrol Dosepak, muscle relaxer, oxycodone, L4/5 lumbar injection -Since he is doing well and his symptoms are 90% better, recommended no further intervention at this time -Told him I want to see him back in 6 weeks, but he is coming from some distance so he wanted to follow-up as needed     Patient expressed understanding of the plan and all questions were answered to the patient's satisfaction.    ___________________________________________________________________________     History:   Patient is a 62 y.o. male who presents today for follow-up on his lumbar spine.  Patient has been doing well since he was last seen.  He has gotten an injection with Dr. Alvester Morin.  He said he is now 90% better.  He is only taking Robaxin and gabapentin as needed.  He will have several days where he does not take any medication for his pain.  Most of his pain is now located in the right side of the lower back.  His leg pain has almost completely resolved.  Denies paresthesia numbness.  No bowel or bladder incontinence.  No saddle anesthesia.   Treatments tried: NSAIDs, Tylenol, Medrol Dosepak, muscle relaxer, oxycodone, lumbar steroid injection     Physical Exam:    General: no acute distress, appears stated age Neurologic: alert, answering questions appropriately, following commands Respiratory:  unlabored breathing on room air, symmetric chest rise Psychiatric: appropriate affect, normal cadence to speech     MSK (spine):   -Strength exam                                                   Left                  Right EHL                              5/5                  5/5 TA                                 5/5                  5/5 GSC                             5/5  5/5 Knee extension            5/5                  5/5 Hip flexion                    5/5                  5/5   -Sensory exam                           Sensation intact to light touch in L3-S1 nerve distributions of bilateral lower extremities   -Achilles DTR: 2/4 on the left, 2/4 on the right -Patellar tendon DTR: 2/4 on the left, 2/4 on the right   -Straight leg raise: Negative bilaterally -Femoral nerve stretch test: Negative bilaterally -Clonus: no beats bilaterally   Imaging: XR of the lumbar spine from 05/15/2022 was previously independently reviewed and interpreted, showing disc height loss at L4/5 and L5/S1.  No fracture or dislocation seen.  No evidence of instability on flexion/extension views.   MRI of the lumbar spine from 05/13/2022 was previously independently reviewed and interpreted, showing paracentral disc herniation at L4/5 on the right side.  Small broad-based left extraforaminal disc herniation at L5/S1.  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  DDD at L3/4, L4/5, L5/S1.     Patient name: Adam Cunningham. Patient MRN: [id number] Date of visit: 07/11/22

## 2022-07-18 ENCOUNTER — Other Ambulatory Visit (HOSPITAL_COMMUNITY): Payer: Self-pay

## 2022-07-22 ENCOUNTER — Ambulatory Visit: Payer: 59 | Attending: Orthopedic Surgery | Admitting: Physical Therapy

## 2022-07-22 ENCOUNTER — Other Ambulatory Visit: Payer: Self-pay

## 2022-07-22 DIAGNOSIS — M5416 Radiculopathy, lumbar region: Secondary | ICD-10-CM | POA: Insufficient documentation

## 2022-07-22 DIAGNOSIS — M5459 Other low back pain: Secondary | ICD-10-CM | POA: Diagnosis not present

## 2022-07-22 NOTE — Therapy (Signed)
[facility] Outpatient Rehabilitation at [facility] [address] [place], [place], [address] Phone: [phone]   Fax:  [phone]  Patient Details  Name: Adam Cunningham. MRN: [id number] Date of Birth: February 24, 1960 Referring Provider:  London Sheer, MD  Encounter Date: 07/22/2022   Adam Cunningham, [place], PT 07/22/2022, 10:03 AM  [facility] Out[facility] Rehabilitation at [facility] [address] [place], [place], [address] Phone: [phone]   Fax:  [phone]

## 2022-07-22 NOTE — Therapy (Signed)
OUTPATIENT PHYSICAL THERAPY THORACOLUMBAR EVALUATION   Patient Name: Adam Cunningham. MRN: [id number] DOB:Jul 17, 1960, 62 y.o., male Today's Date: 07/22/2022  END OF SESSION:  PT End of Session - 07/22/22 1154     Visit Number 1    Number of Visits 4    Date for PT Re-Evaluation 08/19/22    PT Start Time 0929    PT Stop Time 1002    PT Time Calculation (min) 33 min    Behavior During Therapy [facility] for tasks assessed/performed             Past Medical History:  Diagnosis Date   Cancer (HCC)    prostate   Gout    Past Surgical History:  Procedure Laterality Date   BICEPS TENDON REPAIR     PROSTATE SURGERY     SHOULDER OPEN ROTATOR CUFF REPAIR Left 04/29/2019   Procedure: OPEN ROTATOR CUFF REPAIR LEFT SHOULDER;  Surgeon: Vickki Hearing, MD;  Location: [facility];  Service: Orthopedics;  Laterality: Left;  and interscalene block    Patient Active Problem List   Diagnosis Date Noted   Hyperlipidemia 11/26/2011   Gout 11/26/2011   Low testosterone 11/26/2011   H/O prostate cancer 05/26/2007    REFERRING PROVIDER: London Sheer.  REFERRING DIAG: Lumbar radiculopathy.  Rationale for Evaluation and Treatment: Rehabilitation  THERAPY DIAG:  Other low back pain  ONSET DATE: 3-4 months ago.  SUBJECTIVE:                                                                                                                                                                                           SUBJECTIVE STATEMENT: The patient reports the onset of right-sided low back pain about 3-4 months ago for no apparent reason.  He has had two ESI's which have been very helpful and he states he is "80-90% better."  In fact, at the time of his evaluation he reports no pain.    PERTINENT HISTORY:  Left shoulder surgery.  PAIN:  Are you having pain? No  PRECAUTIONS: None  WEIGHT BEARING RESTRICTIONS: No  FALLS:  Has patient fallen in last 6 months? No  LIVING  ENVIRONMENT: Lives with: lives with their spouse Lives in: House/apartment Has following equipment at home: None  OCCUPATION: Semi-retired from [employer].  PLOF: Independent  PATIENT GOALS: Be pain-free and prevent reoccurrence.    OBJECTIVE:   POSTURE: No Significant postural limitations  PALPATION: No palpable pain reported today.  LUMBAR ROM:   Full active lumbar flexion and  extension to 20 degrees.  LOWER EXTREMITY ROM:     WNL.  LOWER EXTREMITY MMT:  Normal bilateral LE strength.  LUMBAR SPECIAL TESTS:  Equal leg lengths, (-) SLR and FABER testing.   Right Patellar and Achilles reflex less brisk than left.   GAIT: WNL.  TODAY'S TREATMENT:                                                                                                                              DATE: HOME EXERCISE PROGRAM Created by [place] Savas Elvin Jul 1st, 2024 View at [email or web address]-exercise-[email or web address] using code: [email or web address]  Page 1 of 1 3 Exercises Bridges While laying on your back, contract the low abdominals and lift from the hips. Repeat 15 Times Hold 2 Seconds Complete 2 Sets Perform 2 Times a Day SINGLE KNEE TO CHEST STRETCH - SKTC While Lying on your back, hold your knee and gently pull it up towards your chest. Repeat 3 Times Hold 30 Seconds Complete 1 Set Perform 3 Times a Day Double Knee to chest stretch Pull your knees up to your chest hold for 30 seconds then relax. Repeat Continue to breathe throughout stretch. Repeat 2 Times Hold 30 Seconds Complete 3 Sets Perform 2 Times a Da    PATIENT EDUCATION:  Education details: See above. Person educated: Patient Education method: Explanation, Demonstration, and Handouts Education comprehension: verbalized understanding and returned demonstration  HOME EXERCISE PROGRAM: As above.  ASSESSMENT:  CLINICAL IMPRESSION: The patient presents to OPPT with a diagnosis of right lumbar radiculopathy.  He is considerably better since receiving  two ESI's.  He plans to attend a few PT session for establishment of an HEP.  He had no palpable pain today.  Right Patellar and Achilles reflex less brisk than left.  Special testing is negative.  He is very motivated and expected to do very well.  He has no strength deficits in bilateral LE's.  OBJECTIVE IMPAIRMENTS: decreased activity tolerance.   REHAB POTENTIAL: Excellent  CLINICAL DECISION MAKING: Stable/uncomplicated  EVALUATION COMPLEXITY: Low   GOALS: SHORT TERM GOALS: Target date: 08/19/22.  Ind with a comprehensive HEP. Goal status: INITIAL  PLAN:  PT FREQUENCY: 1x/week  PT DURATION: 4 weeks  PLANNED INTERVENTIONS: Therapeutic exercises, Therapeutic activity, Patient/Family education, Self Care, Dry Needling, Electrical stimulation, Moist heat, Ultrasound, and Manual therapy.  PLAN FOR NEXT SESSION: Core exercise progression, spinal protection techniques and body mechanics training.    Tran Randle, [place], PT 07/22/2022, 12:15 PM

## 2022-08-08 ENCOUNTER — Ambulatory Visit: Payer: 59 | Admitting: *Deleted

## 2022-08-08 DIAGNOSIS — M5416 Radiculopathy, lumbar region: Secondary | ICD-10-CM | POA: Diagnosis not present

## 2022-08-08 DIAGNOSIS — M5459 Other low back pain: Secondary | ICD-10-CM

## 2022-08-08 NOTE — Therapy (Signed)
OUTPATIENT PHYSICAL THERAPY THORACOLUMBAR EVALUATION   Patient Name: Adam Cunningham. MRN: [id number] DOB:12/23/1960, 62 y.o., male Today's Date: 08/08/2022  END OF SESSION:  PT End of Session - 08/08/22 0850     Visit Number 2    Number of Visits 4    Date for PT Re-Evaluation 08/19/22    PT Start Time 0845    PT Stop Time 0935    PT Time Calculation (min) 50 min             Past Medical History:  Diagnosis Date   Cancer [facility])    prostate   Gout    Past Surgical History:  Procedure Laterality Date   BICEPS TENDON REPAIR     PROSTATE SURGERY     SHOULDER OPEN ROTATOR CUFF REPAIR Left 04/29/2019   Procedure: OPEN ROTATOR CUFF REPAIR LEFT SHOULDER;  Surgeon: Vickki Hearing, MD;  Location: [facility];  Service: Orthopedics;  Laterality: Left;  and interscalene block    Patient Active Problem List   Diagnosis Date Noted   Hyperlipidemia 11/26/2011   Gout 11/26/2011   Low testosterone 11/26/2011   H/O prostate cancer 05/26/2007    REFERRING PROVIDER: London Sheer.  REFERRING DIAG: Lumbar radiculopathy.  Rationale for Evaluation and Treatment: Rehabilitation  THERAPY DIAG:  Other low back pain  ONSET DATE: 3-4 months ago.  SUBJECTIVE:                                                                                                                                                                                           SUBJECTIVE STATEMENT:  Doing better. Sleeping better with less pain.   PERTINENT HISTORY:  Left shoulder surgery.  PAIN:  Are you having pain? No  PRECAUTIONS: None  WEIGHT BEARING RESTRICTIONS: No  FALLS:  Has patient fallen in last 6 months? No  LIVING ENVIRONMENT: Lives with: lives with their spouse Lives in: House/apartment Has following equipment at home: None  OCCUPATION: Semi-retired from [employer].  PLOF: Independent  PATIENT GOALS: Be pain-free and prevent reoccurrence.    OBJECTIVE:   POSTURE: No Significant  postural limitations  PALPATION: No palpable pain reported today.  LUMBAR ROM:   Full active lumbar flexion and  extension to 20 degrees.  LOWER EXTREMITY ROM:     WNL.  LOWER EXTREMITY MMT:    Normal bilateral LE strength.  LUMBAR SPECIAL TESTS:  Equal leg lengths, (-) SLR and FABER testing.   Right Patellar and Achilles reflex less brisk than left.   GAIT: WNL.  TODAY'S TREATMENT:  DATE:                                                                08-08-22                                     EXERCISE LOG  Exercise Repetitions and Resistance Comments  AB bracing X 10 hold 5 secs   Standing modified bird-dog Arms x6 hold 5 secs, LE's x 6 hold 5 secs                Blank cell = exercise not performed today  Discussed AB bracing with transitional movements and movement patterns with ADLS to decrease pain triggers Handout given for modified  bird-dog    Discussed and reviewed below HEP.  HOME EXERCISE PROGRAM Created by [place] Applegate Jul 1st, 2024 View at www.my-exercise-[email or web address] using code: HEYHRLT  Page 1 of 1 3 Exercises Bridges While laying on your back, contract the low abdominals and lift from the hips. Repeat 15 Times Hold 2 Seconds Complete 2 Sets Perform 2 Times a Day SINGLE KNEE TO CHEST STRETCH - SKTC While Lying on your back, hold your knee and gently pull it up towards your chest. Repeat 3 Times Hold 30 Seconds Complete 1 Set Perform 3 Times a Day Double Knee to chest stretch Pull your knees up to your chest hold for 30 seconds then relax. Repeat Continue to breathe throughout stretch. Repeat 2 Times Hold 30 Seconds Complete 3 Sets Perform 2 Times a Da    PATIENT EDUCATION:  Education details: See above. Person educated: Patient Education method: Explanation, Demonstration, and Handouts Education comprehension:  verbalized understanding and returned demonstration  HOME EXERCISE PROGRAM: As above.  ASSESSMENT:  CLINICAL IMPRESSION: Pt arrived today doing better with decreased LB pain and sleeping better. Rx focused on AB bracing during transitional movements as well as modifying movement patterns to decrease pain triggers with ADL's. Pt was introduced to standing modified bird-dog and did well. Handout given for  HEP     OBJECTIVE IMPAIRMENTS: decreased activity tolerance.   REHAB POTENTIAL: Excellent  CLINICAL DECISION MAKING: Stable/uncomplicated  EVALUATION COMPLEXITY: Low   GOALS: SHORT TERM GOALS: Target date: 08/19/22.  Ind with a comprehensive HEP. Goal status: INITIAL  PLAN:  PT FREQUENCY: 1x/week  PT DURATION: 4 weeks  PLANNED INTERVENTIONS: Therapeutic exercises, Therapeutic activity, Patient/Family education, Self Care, Dry Needling, Electrical stimulation, Moist heat, Ultrasound, and Manual therapy.  PLAN FOR NEXT SESSION: Core exercise progression, spinal protection techniques and body mechanics training.    Barri Neidlinger,CHRIS, PTA 08/08/2022, 6:04 PM

## 2022-09-30 ENCOUNTER — Other Ambulatory Visit (HOSPITAL_COMMUNITY): Payer: Self-pay

## 2022-10-30 ENCOUNTER — Other Ambulatory Visit: Payer: Self-pay

## 2022-10-30 ENCOUNTER — Ambulatory Visit (INDEPENDENT_AMBULATORY_CARE_PROVIDER_SITE_OTHER): Payer: 59 | Admitting: Family Medicine

## 2022-10-30 ENCOUNTER — Encounter: Payer: Self-pay | Admitting: Family Medicine

## 2022-10-30 VITALS — HR 65 | Temp 97.8°F | Wt 251.0 lb

## 2022-10-30 DIAGNOSIS — J3489 Other specified disorders of nose and nasal sinuses: Secondary | ICD-10-CM | POA: Diagnosis not present

## 2022-10-30 DIAGNOSIS — J320 Chronic maxillary sinusitis: Secondary | ICD-10-CM

## 2022-10-30 DIAGNOSIS — Z23 Encounter for immunization: Secondary | ICD-10-CM

## 2022-10-30 MED ORDER — PREDNISONE 20 MG PO TABS
40.0000 mg | ORAL_TABLET | Freq: Every day | ORAL | 0 refills | Status: DC
Start: 2022-10-30 — End: 2023-02-13
  Filled 2022-10-30: qty 10, 5d supply, fill #0

## 2022-10-30 MED ORDER — AMOXICILLIN-POT CLAVULANATE 875-125 MG PO TABS
1.0000 | ORAL_TABLET | Freq: Two times a day (BID) | ORAL | 0 refills | Status: DC
Start: 2022-10-30 — End: 2023-02-13

## 2022-10-30 NOTE — Patient Instructions (Signed)
Sinus pressure may be related to congestion, which can sometimes be from allergy.  Some of the increased congestion can also be secondary to the continued use of nasal decongestions that cause rebound congestion.  Stop Afrin or other nasal decongestions at this point, try starting Flonase over-the-counter, okay to continue fexofenadine for allergies.  Prednisone prescribed to help with congestion, but if that is not helping in the next 3 to 4 days, can fill prescription for antibiotic for possible sinus infection.  Let me know if neither of these improve symptoms or if any worsening of symptoms.  Take care.

## 2022-10-30 NOTE — Addendum Note (Signed)
Addended by: Arty Baumgartner A on: 10/30/2022 02:26 PM   Modules accepted: Orders

## 2022-10-30 NOTE — Progress Notes (Signed)
Subjective:  Patient ID: Adam Lint., male    DOB: [birth year]  Age: 62 y.o. MRN: [id number]  CC:  Chief Complaint  Patient presents with   Nasal Congestion    3 weeks; Nasal congestion, pressure underneath the eyes, no cough or chest congestion. Pt has been using a nasal spray as well as generic allergy meds.   Yes to flu vax    HPI Adam Lint. presents for   Nasal, sinus congestion Past 3 weeks with nasal congestion, pressure under the eyes, mostly R maxillary sinus area, but no cough or chest congestion. No discolored nasal congestion. Minimal runny nose at times, other times congested. Clear when it does run.   Attempted treatments with over-the-counter allergy meds fexofenadine, and nasal spray (decongestant nasal spray - afrin 1-2 times per day for past 3 weeks). No recent steroid nasal spray.  No fever. Otherwise feels ok.    History Patient Active Problem List   Diagnosis Date Noted   Hyperlipidemia 11/26/2011   Gout 11/26/2011   Low testosterone 11/26/2011   H/O prostate cancer 05/26/2007   Past Medical History:  Diagnosis Date   Cancer [facility])    prostate   Gout    Past Surgical History:  Procedure Laterality Date   BICEPS TENDON REPAIR     PROSTATE SURGERY     SHOULDER OPEN ROTATOR CUFF REPAIR Left 04/29/2019   Procedure: OPEN ROTATOR CUFF REPAIR LEFT SHOULDER;  Surgeon: Vickki Hearing, MD;  Location: [facility];  Service: Orthopedics;  Laterality: Left;  and interscalene block    Allergies  Allergen Reactions   Crestor [Rosuvastatin Calcium] Other (See Comments)    Myalgias, fatigue.    Prior to Admission medications   Medication Sig Start Date End Date Taking? Authorizing Provider  allopurinol (ZYLOPRIM) 300 MG tablet Take 1 tablet (300 mg total) by mouth daily. 01/28/22  Yes Shade Flood, MD  colchicine 0.6 MG tablet Take 1 tablet (0.6 mg total) by mouth daily as needed (gout flare ups). 01/28/22  Yes Shade Flood, MD   erythromycin ophthalmic ointment Place 1 Application into the right eye 3 (three) times daily. For 1 week for conjunctivitis/infection. 07/01/22  Yes Shade Flood, MD  ezetimibe (ZETIA) 10 MG tablet Take 1 tablet (10 mg total) by mouth daily. 05/02/22 04/27/23 Yes Hilty, Lisette Abu, MD  gabapentin (NEURONTIN) 300 MG capsule Take 1 capsule (300 mg total) by mouth 3 (three) times daily. 05/13/22  Yes Vickki Hearing, MD  ibuprofen (ADVIL) 800 MG tablet Take 1 tablet (800 mg total) by mouth every 8 (eight) hours as needed. 04/29/19  Yes Vickki Hearing, MD  methocarbamol (ROBAXIN) 750 MG tablet Take 1 tablet (750 mg total) by mouth every 8 (eight) hours as needed (muscle spasms, pain). 06/19/22  Yes London Sheer, MD  tadalafil (CIALIS) 20 MG tablet TAKE 1/2-1 TABLET BY MOUTH EVERY OTHER DAY AS NEEDED FOR ERECTILE DYSFUNCTION 01/28/22 01/28/23 Yes Shade Flood, MD  predniSONE (STERAPRED UNI-PAK 48 TAB) 10 MG (48) TBPK tablet Take by mouth daily. 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   Engineer, water   Education: [employer]   Exercise: Yes   Social Determinants of Health   Financial Resource Strain: Low Risk  (10/27/2022)   Overall Financial Resource Strain (CARDIA)    Difficulty of Paying Living Expenses: Not hard at all  Food Insecurity: No Food Insecurity (10/27/2022)    Hunger Vital Sign    Worried About Running Out of Food in the Last Year: Never true    Ran Out of Food in the Last Year: Never true  Transportation Needs: No Transportation Needs (10/27/2022)   PRAPARE - Administrator, Civil Service (Medical): No    Lack of Transportation (Non-Medical): No  Physical Activity: Insufficiently Active (10/27/2022)   Exercise Vital Sign    Days of Exercise per Week: 3 days    Minutes of Exercise per Session: 20 min  Stress: No Stress Concern Present (10/27/2022)   [employer] of Occupational Health - Occupational Stress Questionnaire    Feeling of Stress : Not at all  Social Connections: Socially Integrated (10/27/2022)   Social Connection and Isolation Panel [NHANES]    Frequency of Communication with Friends and Family: More than three times a week    Frequency of Social Gatherings with Friends and Family: More than three times a week    Attends Religious Services: More than 4 times per year    Active Member of [employer] or Organizations: Yes    Attends Engineer, structural: More than 4 times per year    Marital Status: Married  Catering manager Violence: Not on file    Review of Systems Per HPI.  Objective:   Vitals:   10/30/22 0923  Pulse: 65  Temp: 97.8 F (36.6 C)  SpO2: 96%  Weight: 251 lb (113.9 kg)     Physical Exam Vitals reviewed.  Constitutional:      Appearance: He is well-developed.  HENT:     Head: Normocephalic and atraumatic.     Right Ear: Tympanic membrane, ear canal and external ear normal.     Left Ear: Tympanic membrane, ear canal and external ear normal.     Nose: Nose normal. No rhinorrhea.     Comments: Describes area of pressure on the right maxillary sinus but nontender on exam.  Septum intact, no erythema or bleeding.  No significant edema of turbinates appreciated, no active discharge.  No posterior oropharynx discharge or PND.    Mouth/Throat:     Pharynx: No oropharyngeal exudate or  posterior oropharyngeal erythema.  Eyes:     Conjunctiva/sclera: Conjunctivae normal.     Pupils: Pupils are equal, round, and reactive to light.  Cardiovascular:     Rate and Rhythm: Normal rate and regular rhythm.     Heart sounds: Normal heart sounds. No murmur heard. Pulmonary:     Effort: Pulmonary effort is normal.     Breath sounds: Normal breath sounds. No wheezing, rhonchi or rales.  Abdominal:     Palpations: Abdomen is soft.     Tenderness: There is no abdominal tenderness.  Musculoskeletal:     Cervical back: Neck supple.  Lymphadenopathy:  Cervical: No cervical adenopathy.  Skin:    General: Skin is warm and dry.     Findings: No rash.  Neurological:     Mental Status: He is alert and oriented to person, place, and time.  Psychiatric:        Behavior: Behavior normal.        Assessment & Plan:  Adam Rei. is a 62 y.o. male . Right maxillary sinusitis - Plan: amoxicillin-clavulanate (AUGMENTIN) 875-125 MG tablet  Sinus pressure - Plan: predniSONE (DELTASONE) 20 MG tablet  Few weeks of sinus congestion, pressure as above.  Differential includes sinusitis but without discolored nasal discharge, decided to hold on antibiotics initially, in favor of treating congestion, pressure with possible component of allergies and possible component of rhinitis medicamentosa.  Caution regarding nasal decongestants in the future, stop nasal decongestants at this time, start Flonase, short-term prednisone with potential side effects discussed.  If not improving next 3 to 4 days can start Augmentin with RTC precautions.  Potential side effects of meds discussed.  Meds ordered this encounter  Medications   predniSONE (DELTASONE) 20 MG tablet    Sig: Take 2 tablets (40 mg total) by mouth daily with breakfast.    Dispense:  10 tablet    Refill:  0   amoxicillin-clavulanate (AUGMENTIN) 875-125 MG tablet    Sig: Take 1 tablet by mouth 2 (two) times daily.    Dispense:   20 tablet    Refill:  0   Patient Instructions  Sinus pressure may be related to congestion, which can sometimes be from allergy.  Some of the increased congestion can also be secondary to the continued use of nasal decongestions that cause rebound congestion.  Stop Afrin or other nasal decongestions at this point, try starting Flonase over-the-counter, okay to continue fexofenadine for allergies.  Prednisone prescribed to help with congestion, but if that is not helping in the next 3 to 4 days, can fill prescription for antibiotic for possible sinus infection.  Let me know if neither of these improve symptoms or if any worsening of symptoms.  Take care.     Signed,   Meredith Staggers, MD [facility], [facility] 10/30/22 9:57 AM

## 2022-12-17 ENCOUNTER — Encounter: Payer: Self-pay | Admitting: Family Medicine

## 2022-12-18 NOTE — Telephone Encounter (Signed)
Initially sent message by MyChart, then I called patient.  He is improving.   Denies chest pain, dyspnea or confusion.  RTC/urgent care precautions given. Continue symptomatic care at this time.

## 2022-12-30 ENCOUNTER — Other Ambulatory Visit: Payer: Self-pay

## 2023-01-05 ENCOUNTER — Encounter: Payer: Self-pay | Admitting: Physical Medicine and Rehabilitation

## 2023-01-06 ENCOUNTER — Other Ambulatory Visit: Payer: Self-pay | Admitting: Physical Medicine and Rehabilitation

## 2023-01-06 DIAGNOSIS — M5416 Radiculopathy, lumbar region: Secondary | ICD-10-CM

## 2023-02-04 ENCOUNTER — Ambulatory Visit (INDEPENDENT_AMBULATORY_CARE_PROVIDER_SITE_OTHER): Payer: 59 | Admitting: Physical Medicine and Rehabilitation

## 2023-02-04 ENCOUNTER — Other Ambulatory Visit: Payer: Self-pay

## 2023-02-04 DIAGNOSIS — M5416 Radiculopathy, lumbar region: Secondary | ICD-10-CM

## 2023-02-04 MED ORDER — METHYLPREDNISOLONE ACETATE 40 MG/ML IJ SUSP
40.0000 mg | Freq: Once | INTRAMUSCULAR | Status: AC
Start: 2023-02-04 — End: 2023-02-04
  Administered 2023-02-04: 40 mg

## 2023-02-04 NOTE — Procedures (Signed)
 Lumbosacral Transforaminal Epidural Steroid Injection - Sub-Pedicular Approach with Fluoroscopic Guidance  Patient: Adam Cunningham.      Date of Birth: 07-May-1960 MRN: [id number] PCP: Levora Reyes SAUNDERS, MD      Visit Date: 02/04/2023   Universal Protocol:    Date/Time: 02/04/2023  Consent Given By: the patient  Position: PRONE  Additional Comments: Vital signs were monitored before and after the procedure. Patient was prepped and draped in the usual sterile fashion. The correct patient, procedure, and site was verified.   Injection Procedure Details:   Procedure diagnoses: Radiculopathy, lumbar region [M54.16]    Meds Administered:  Meds ordered this encounter  Medications   methylPREDNISolone  acetate (DEPO-MEDROL ) injection 40 mg    Laterality: Right  Location/Site: L5  Needle:5.0 in., 22 ga.  Short bevel or Quincke spinal needle  Needle Placement: Transforaminal  Findings:    -Comments: Excellent flow of contrast along the nerve, nerve root and into the epidural space.  Procedure Details: After squaring off the end-plates to get a true AP view, the C-arm was positioned so that an oblique view of the foramen as noted above was visualized. The target area is just inferior to the nose of the scotty dog or sub pedicular. The soft tissues overlying this structure were infiltrated with 2-3 ml. of 1% Lidocaine  without Epinephrine .  The spinal needle was inserted toward the target using a trajectory view along the fluoroscope beam.  Under AP and lateral visualization, the needle was advanced so it did not puncture dura and was located close the 6 O'Clock position of the pedical in AP tracterory. Biplanar projections were used to confirm position. Aspiration was confirmed to be negative for CSF and/or blood. A 1-2 ml. volume of Isovue-250 was injected and flow of contrast was noted at each level. Radiographs were obtained for documentation purposes.   After attaining  the desired flow of contrast documented above, a 0.5 to 1.0 ml test dose of 0.25% Marcaine  was injected into each respective transforaminal space.  The patient was observed for 90 seconds post injection.  After no sensory deficits were reported, and normal lower extremity motor function was noted,   the above injectate was administered so that equal amounts of the injectate were placed at each foramen (level) into the transforaminal epidural space.   Additional Comments:  The patient tolerated the procedure well Dressing: 2 x 2 sterile gauze and Band-Aid    Post-procedure details: Patient was observed during the procedure. Post-procedure instructions were reviewed.  Patient left the clinic in stable condition.

## 2023-02-04 NOTE — Progress Notes (Signed)
 Adam Cunningham Adam Cunningham. - 63 y.o. male MRN [id number]  Date of birth: 05-26-1960  Office Visit Note: Visit Date: 02/04/2023 PCP: Levora Reyes SAUNDERS, MD Referred by: Levora Reyes SAUNDERS, MD  Subjective: Chief Complaint  Patient presents with   Lower Back - Pain   HPI:  Adam Cunningham. is a 63 y.o. male who comes in today at the request of Adam Roger, FNP for planned Right L5-S1 Lumbar Transforaminal epidural steroid injection with fluoroscopic guidance.  The patient has failed conservative care including home exercise, medications, time and activity modification.  This injection will be diagnostic and hopefully therapeutic.  Please see requesting physician notes for further details and justification.   ROS Otherwise per HPI.  Assessment & Plan: Visit Diagnoses:    ICD-10-CM   1. Radiculopathy, lumbar region  M54.16 XR C-ARM NO REPORT    Epidural Steroid injection    methylPREDNISolone  acetate (DEPO-MEDROL ) injection 40 mg      Plan: No additional findings.   Meds & Orders:  Meds ordered this encounter  Medications   methylPREDNISolone  acetate (DEPO-MEDROL ) injection 40 mg    Orders Placed This Encounter  Procedures   XR C-ARM NO REPORT   Epidural Steroid injection    Follow-up: Return for visit to requesting provider as needed.   Procedures: No procedures performed  Lumbosacral Transforaminal Epidural Steroid Injection - Sub-Pedicular Approach with Fluoroscopic Guidance  Patient: Adam Cunningham.      Date of Birth: [birth year] MRN: [id number] PCP: Levora Reyes SAUNDERS, MD      Visit Date: 02/04/2023   Universal Protocol:    Date/Time: 02/04/2023  Consent Given By: the patient  Position: PRONE  Additional Comments: Vital signs were monitored before and after the procedure. Patient was prepped and draped in the usual sterile fashion. The correct patient, procedure, and site was verified.   Injection Procedure Details:   Procedure diagnoses:  Radiculopathy, lumbar region [M54.16]    Meds Administered:  Meds ordered this encounter  Medications   methylPREDNISolone  acetate (DEPO-MEDROL ) injection 40 mg    Laterality: Right  Location/Site: L5  Needle:5.0 in., 22 ga.  Short bevel or Quincke spinal needle  Needle Placement: Transforaminal  Findings:    -Comments: Excellent flow of contrast along the nerve, nerve root and into the epidural space.  Procedure Details: After squaring off the end-plates to get a true AP view, the C-arm was positioned so that an oblique view of the foramen as noted above was visualized. The target area is just inferior to the nose of the scotty dog or sub pedicular. The soft tissues overlying this structure were infiltrated with 2-3 ml. of 1% Lidocaine  without Epinephrine .  The spinal needle was inserted toward the target using a trajectory view along the fluoroscope beam.  Under AP and lateral visualization, the needle was advanced so it did not puncture dura and was located close the 6 O'Clock position of the pedical in AP tracterory. Biplanar projections were used to confirm position. Aspiration was confirmed to be negative for CSF and/or blood. A 1-2 ml. volume of Isovue-250 was injected and flow of contrast was noted at each level. Radiographs were obtained for documentation purposes.   After attaining the desired flow of contrast documented above, a 0.5 to 1.0 ml test dose of 0.25% Marcaine  was injected into each respective transforaminal space.  The patient was observed for 90 seconds post injection.  After no sensory deficits were reported, and normal lower extremity motor function was noted,  the above injectate was administered so that equal amounts of the injectate were placed at each foramen (level) into the transforaminal epidural space.   Additional Comments:  The patient tolerated the procedure well Dressing: 2 x 2 sterile gauze and Band-Aid    Post-procedure details: Patient  was observed during the procedure. Post-procedure instructions were reviewed.  Patient left the clinic in stable condition.    Clinical History: CLINICAL DATA:  Initial evaluation for low back pain, radiation into the left lower extremity, increased fracture risk.   EXAM: MRI LUMBAR SPINE WITHOUT CONTRAST   TECHNIQUE: Multiplanar, multisequence MR imaging of the lumbar spine was performed. No intravenous contrast was administered.   COMPARISON:  Prior radiograph from 09/06/2019.   FINDINGS: Segmentation: Standard. Lowest well-formed disc space labeled the L5-S1 level.   Alignment: Straightening of the normal lumbar lordosis. 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   Vertebrae: Vertebral body height maintained without acute or chronic fracture. Bone marrow signal intensity within normal limits. Benign hemangioma partially visualize within the T12 vertebral body. No worrisome osseous lesions. Discogenic reactive endplate change with marrow edema present about the L4-5 interspace. No other significant abnormal marrow edema.   Conus medullaris and cauda equina: Conus extends to the T12-L1 level. Conus and cauda equina appear normal.   Paraspinal and other soft tissues: Paraspinous soft tissues within normal limits.   Disc levels:   T12-L1: Disc desiccation with mild disc bulge and endplate spurring. Mild facet hypertrophy. No significant canal or foraminal stenosis.   L1-2: Diffuse disc bulge with disc desiccation and endplate spurring. Mild facet hypertrophy. No spinal stenosis. Foramina remain patent.   L2-3: Disc desiccation with mild disc bulge. Mild facet hypertrophy. No significant spinal stenosis. Foramina remain patent.   L3-4: Degenerative intervertebral disc space narrowing with diffuse disc bulge and disc desiccation. Mild reactive endplate spurring. Superimposed small left foraminal disc protrusion contacts  the exiting left L3 nerve root (series 14, image 22). Mild bilateral facet hypertrophy. No significant spinal stenosis. Mild bilateral L3 foraminal narrowing.   L4-5: Degenerative intervertebral disc space narrowing with diffuse disc bulge and disc desiccation. Superimposed right subarticular to foraminal disc protrusion with slight superior migration (series 14, images 28, 27). Disc material contacts both the exiting right L4 and descending L5 nerve roots, either which could be affected. Mild to moderate right worse than left facet hypertrophy. Resultant moderate right subarticular stenosis. Central canal remains patent. Mild left with moderate right L4 foraminal stenosis.   L5-S1: Trace anterolisthesis. Disc desiccation with diffuse disc bulge and reactive endplate spurring. Superimposed broad-based left extraforaminal disc protrusion closely approximates and/or contacts the exiting left L5 nerve root (series 14, image 33). Moderate right with mild left facet arthrosis with chronic unilateral right-sided pars defect. Mild narrowing of the right lateral recess. Central canal remains patent. Mild bilateral L5 foraminal stenosis.   IMPRESSION: 1. No acute or recent fracture within the lumbar spine. 2. Small left foraminal disc protrusion at L3-4, contacting and potentially irritating the exiting left L3 nerve root. 3. Right subarticular to foraminal disc protrusion at L4-5, contacting both the exiting right L4 and descending L5 nerve roots, either of which could be affected. 4. Broad-based left extraforaminal disc protrusion at L5-S1, closely approximating the exiting left L5 nerve root. 5. 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     Electronically Signed   By: Adam Cunningham M.D.   On: 05/13/2022 20:51  Objective:  VS:  HT:    WT:   BMI:     BP:   HR: bpm  TEMP: ( )  RESP:  Physical Exam Vitals and nursing note  reviewed.  Constitutional:      General: He is not in acute distress.    Appearance: Normal appearance. He is obese. He is not ill-appearing.  HENT:     Head: Normocephalic and atraumatic.     Right Ear: External ear normal.     Left Ear: External ear normal.     Nose: No congestion.  Eyes:     Extraocular Movements: Extraocular movements intact.  Cardiovascular:     Rate and Rhythm: Normal rate.     Pulses: Normal pulses.  Pulmonary:     Effort: Pulmonary effort is normal. No respiratory distress.  Abdominal:     General: There is no distension.     Palpations: Abdomen is soft.  Musculoskeletal:        General: No tenderness or signs of injury.     Cervical back: Neck supple.     Right lower leg: No edema.     Left lower leg: No edema.     Comments: Patient has good distal strength without clonus.  Skin:    Findings: No erythema or rash.  Neurological:     General: No focal deficit present.     Mental Status: He is alert and oriented to person, place, and time.     Sensory: No sensory deficit.     Motor: No weakness or abnormal muscle tone.     Coordination: Coordination normal.  Psychiatric:        Mood and Affect: Mood normal.        Behavior: Behavior normal.      Imaging: No results found.

## 2023-02-06 ENCOUNTER — Encounter: Payer: 59 | Admitting: Family Medicine

## 2023-02-07 ENCOUNTER — Other Ambulatory Visit: Payer: Self-pay

## 2023-02-07 ENCOUNTER — Other Ambulatory Visit: Payer: Self-pay | Admitting: Family Medicine

## 2023-02-07 DIAGNOSIS — M109 Gout, unspecified: Secondary | ICD-10-CM

## 2023-02-07 DIAGNOSIS — N529 Male erectile dysfunction, unspecified: Secondary | ICD-10-CM

## 2023-02-07 MED ORDER — ALLOPURINOL 300 MG PO TABS
300.0000 mg | ORAL_TABLET | Freq: Every day | ORAL | 3 refills | Status: AC
Start: 2023-02-07 — End: ?
  Filled 2023-02-07 (×2): qty 90, 90d supply, fill #0
  Filled 2023-05-22: qty 90, 90d supply, fill #1
  Filled 2023-09-09: qty 90, 90d supply, fill #2
  Filled 2023-12-02: qty 90, 90d supply, fill #3

## 2023-02-07 MED ORDER — TADALAFIL 20 MG PO TABS
10.0000 mg | ORAL_TABLET | ORAL | 11 refills | Status: DC | PRN
  Filled 2023-02-07 (×2): qty 5, 25d supply, fill #0
  Filled 2023-04-05: qty 5, 25d supply, fill #1
  Filled 2023-05-22: qty 5, 25d supply, fill #2
  Filled 2023-08-15: qty 5, 25d supply, fill #3
  Filled 2023-12-02: qty 5, 25d supply, fill #4

## 2023-02-13 ENCOUNTER — Other Ambulatory Visit: Payer: Self-pay

## 2023-02-13 ENCOUNTER — Ambulatory Visit: Payer: 59 | Admitting: Family Medicine

## 2023-02-13 ENCOUNTER — Encounter: Payer: Self-pay | Admitting: Family Medicine

## 2023-02-13 ENCOUNTER — Telehealth: Payer: Self-pay | Admitting: Family Medicine

## 2023-02-13 VITALS — BP 124/66 | HR 62 | Temp 97.9°F | Ht 73.0 in | Wt 256.4 lb

## 2023-02-13 DIAGNOSIS — Z23 Encounter for immunization: Secondary | ICD-10-CM

## 2023-02-13 DIAGNOSIS — E782 Mixed hyperlipidemia: Secondary | ICD-10-CM

## 2023-02-13 DIAGNOSIS — Z131 Encounter for screening for diabetes mellitus: Secondary | ICD-10-CM | POA: Diagnosis not present

## 2023-02-13 DIAGNOSIS — M109 Gout, unspecified: Secondary | ICD-10-CM | POA: Diagnosis not present

## 2023-02-13 DIAGNOSIS — M545 Low back pain, unspecified: Secondary | ICD-10-CM

## 2023-02-13 DIAGNOSIS — Z8546 Personal history of malignant neoplasm of prostate: Secondary | ICD-10-CM | POA: Diagnosis not present

## 2023-02-13 DIAGNOSIS — Z0000 Encounter for general adult medical examination without abnormal findings: Secondary | ICD-10-CM | POA: Diagnosis not present

## 2023-02-13 DIAGNOSIS — Z125 Encounter for screening for malignant neoplasm of prostate: Secondary | ICD-10-CM

## 2023-02-13 MED ORDER — COLCHICINE 0.6 MG PO TABS
0.6000 mg | ORAL_TABLET | Freq: Every day | ORAL | 1 refills | Status: AC | PRN
  Filled 2023-02-13: qty 30, 30d supply, fill #0

## 2023-02-13 NOTE — Progress Notes (Signed)
Subjective:  Patient ID: Adam Lint., male    DOB: 06/07/1960  Age: 63 y.o. MRN: [id number]  CC:  Chief Complaint  Patient presents with   Annual Exam    Pt doing well, notes fasting     HPI Adam Lint. presents for Annual Exam  PCP, [facility], Dr. Christell Constant for back. Dr. Romeo Apple for shoulder. Physical medicine and rehab, Dr. Alvester Morin.  Lumbar radiculopathy, with epidural spinal injection most recently January 14.  Has been treated with methocarbamol, prednisone as needed in the past as well as gabapentin. ESI has been helpful. Still episodic robaxin, anti inflammatory, no recent need for gabapentin.   Erectile dysfunction Treated with Cialis as needed previously.  Minimal headache when taking previously but no chest pain or dyspnea with exercise or intercourse and no vision or hearing changes when medication taken. Effective when used.   Hyperlipidemia: Prior evaluation by lipid specialist, Dr. Rennis Golden last year, coronary calcium score of 104 which was in the 63rd percentile.  Intolerant to statins.  Improved diet and some weight loss, elevated lipoprotein a, Zetia 10 mg for treatment.  Tolerated better than statin. Still taking zetia daily  Lab Results  Component Value Date   CHOL 204 (H) 01/28/2022   HDL 40.20 01/28/2022   LDLCALC 113 (H) 01/25/2020   LDLDIRECT 107.0 01/28/2022   TRIG 328.0 (H) 01/28/2022   CHOLHDL 5 01/28/2022   Lab Results  Component Value Date   ALT 21 01/28/2022   AST 19 01/28/2022   ALKPHOS 52 01/28/2022   BILITOT 0.7 01/28/2022   Gout: Last flare: none Daily meds: Allopurinol 300 mg daily Prn med: Colchicine, indomethacin or hydrocodone if needed. Lab Results  Component Value Date   LABURIC 5.7 01/08/2019       02/13/2023    3:14 PM 01/28/2022    2:29 PM 01/25/2021    2:24 PM 10/12/2020    8:47 AM 02/28/2020    3:38 PM  Depression screen PHQ 2/9  Decreased Interest 0 0 0 0 0  Down, Depressed, Hopeless 0 0 0 0 0  PHQ - 2 Score 0  0 0 0 0  Altered sleeping 0      Tired, decreased energy 0      Change in appetite 0      Feeling bad or failure about yourself  0      Trouble concentrating 0      Moving slowly or fidgety/restless 0      Suicidal thoughts 0      PHQ-9 Score 0        Health Maintenance  Topic Date Due   COVID-19 Vaccine (5 - 2024-25 season) 09/22/2022   Colonoscopy  12/07/2028   DTaP/Tdap/Td (3 - Td or Tdap) 06/30/2032   INFLUENZA VACCINE  Completed   Hepatitis C Screening  Completed   HIV Screening  Completed   Zoster Vaccines- Shingrix  Completed   Pneumococcal Vaccine 58-66 Years old  Aged Out   HPV VACCINES  Aged Out  Colonoscopy 11/2018 - Dr. Christella Hartigan. Repeat 10 years - polyps not precancerous.  Prostate: history of prostate CA, prostatectomy in 2008. PSA monitored here.  Lab Results  Component Value Date   PSA1 <0.1 01/25/2020   PSA1 <0.1 01/08/2019   PSA1 <0.1 01/06/2018   PSA 0.00 (L) 01/28/2022   PSA 0.00 Repeated and verified X2. (L) 01/25/2021   PSA <0.01 12/26/2014      Immunization History  Administered Date(s) Administered  Influenza Split 12/09/2011   Influenza, Seasonal, Injecte, Preservative Fre 10/30/2022   Influenza,inj,Quad PF,6+ Mos 12/08/2012, 12/14/2013, 12/27/2015, 01/07/2017, 01/06/2018, 11/05/2018, 01/25/2020, 11/24/2020, 01/28/2022   Influenza-Unspecified 11/22/2014   Moderna Sars-Covid-2 Vaccination 01/30/2019, 02/27/2019, 12/23/2019, 12/07/2020   PNEUMOCOCCAL CONJUGATE-20 02/13/2023   Tdap 10/25/2010, 07/01/2022   Zoster Recombinant(Shingrix) 01/08/2019, 04/14/2019  Covid booster as option. Covid infection in 11/2022.  Prevnar today.   No results found. Prior nasolacrimal duct obstruction, followed by ophthalmology yearly.   Dental: every 6 months.   Alcohol: 1 per week.   Tobacco: none  Exercise:  active with work. Weather limiting outdoor exercise.  Some weight gain.  Wt Readings from Last 3 Encounters:  02/13/23 256 lb 6.4 oz (116.3 kg)   10/30/22 251 lb (113.9 kg)  07/01/22 241 lb 12.8 oz (109.7 kg)   Body mass index is 33.83 kg/m.  History Patient Active Problem List   Diagnosis Date Noted   Hyperlipidemia 11/26/2011   Gout 11/26/2011   Low testosterone 11/26/2011   H/O prostate cancer 05/26/2007   Past Medical History:  Diagnosis Date   Cancer [facility])    prostate   Gout    Past Surgical History:  Procedure Laterality Date   BICEPS TENDON REPAIR     PROSTATE SURGERY     SHOULDER OPEN ROTATOR CUFF REPAIR Left 04/29/2019   Procedure: OPEN ROTATOR CUFF REPAIR LEFT SHOULDER;  Surgeon: Vickki Hearing, MD;  Location: [facility];  Service: Orthopedics;  Laterality: Left;  and interscalene block    Allergies  Allergen Reactions   Crestor [Rosuvastatin Calcium] Other (See Comments)    Myalgias, fatigue.    Prior to Admission medications   Medication Sig Start Date End Date Taking? Authorizing Provider  allopurinol (ZYLOPRIM) 300 MG tablet Take 1 tablet (300 mg total) by mouth daily. 02/07/23  Yes Shade Flood, MD  colchicine 0.6 MG tablet Take 1 tablet (0.6 mg total) by mouth daily as needed (gout flare ups). 01/28/22  Yes Shade Flood, MD  erythromycin ophthalmic ointment Place 1 Application into the right eye 3 (three) times daily. For 1 week for conjunctivitis/infection. 07/01/22  Yes Shade Flood, MD  ezetimibe (ZETIA) 10 MG tablet Take 1 tablet (10 mg total) by mouth daily. 05/02/22 04/27/23 Yes Hilty, Lisette Abu, MD  gabapentin (NEURONTIN) 300 MG capsule Take 1 capsule (300 mg total) by mouth 3 (three) times daily. 05/13/22  Yes Vickki Hearing, MD  ibuprofen (ADVIL) 800 MG tablet Take 1 tablet (800 mg total) by mouth every 8 (eight) hours as needed. 04/29/19  Yes Vickki Hearing, MD  methocarbamol (ROBAXIN) 750 MG tablet Take 1 tablet (750 mg total) by mouth every 8 (eight) hours as needed (muscle spasms, pain). 06/19/22  Yes London Sheer, MD  tadalafil (CIALIS) 20 MG tablet Take 0.5-1 tablets  (10-20 mg total) by mouth every other day as needed for erectile dysfunction. 02/07/23 02/07/24 Yes Shade Flood, MD  amoxicillin-clavulanate (AUGMENTIN) 875-125 MG tablet Take 1 tablet by mouth 2 (two) times daily. Patient not taking: Reported on 02/13/2023 10/30/22   Shade Flood, MD  predniSONE (DELTASONE) 20 MG tablet Take 2 tablets (40 mg total) by mouth daily with breakfast. 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   Engineer, water   Education: [employer]   Exercise: Yes   Social Drivers of Corporate investment banker Strain: Low Risk  (10/27/2022)   Overall Financial Resource Strain (CARDIA)    Difficulty of Paying Living Expenses: Not hard at all  Food Insecurity: No Food Insecurity (10/27/2022)   Hunger Vital Sign    Worried About Running Out of Food in the Last Year: Never true    Ran Out of Food in the Last Year: Never true  Transportation Needs: No Transportation Needs (10/27/2022)   PRAPARE - Administrator, Civil Service (Medical): No    Lack of Transportation (Non-Medical): No  Physical Activity: Insufficiently Active (10/27/2022)   Exercise Vital Sign    Days of Exercise per Week: 3 days    Minutes of Exercise per Session: 20 min  Stress: No Stress Concern Present (10/27/2022)   [employer] of Occupational Health - Occupational Stress  Questionnaire    Feeling of Stress : Not at all  Social Connections: Socially Integrated (10/27/2022)   Social Connection and Isolation Panel [NHANES]    Frequency of Communication with Friends and Family: More than three times a week    Frequency of Social Gatherings with Friends and Family: More than three times a week    Attends Religious Services: More than 4 times per year    Active Member of [employer] or Organizations: Yes    Attends Engineer, structural: More than 4 times per year    Marital Status: Married  Catering manager Violence: Not on file    Review of Systems 13 point review of systems per patient health survey noted.  Negative other than as indicated above or in HPI.    Objective:   Vitals:   02/13/23 1512  BP: 124/66  Pulse: 62  Temp: 97.9 F (36.6 C)  TempSrc: Temporal  SpO2: 97%  Weight: 256 lb 6.4 oz (116.3 kg)  Height: 6\' 1"  (1.854 m)     Physical Exam Vitals reviewed.  Constitutional:      Appearance: He is well-developed.  HENT:     Head: Normocephalic and atraumatic.     Right Ear: External ear normal.     Left Ear: External ear normal.  Eyes:     Conjunctiva/sclera: Conjunctivae normal.     Pupils: Pupils are equal, round, and reactive to light.  Neck:     Thyroid: No thyromegaly.  Cardiovascular:     Rate and Rhythm: Normal rate and regular rhythm.     Heart sounds: Normal heart sounds.  Pulmonary:     Effort: Pulmonary effort is normal. No respiratory distress.     Breath sounds: Normal breath sounds. No wheezing.  Abdominal:     General: There is no distension.     Palpations: Abdomen is soft.     Tenderness: There is no abdominal tenderness.  Musculoskeletal:        General: No tenderness. Normal range of motion.     Cervical back: Normal range  of motion and neck supple.  Lymphadenopathy:     Cervical: No cervical adenopathy.  Skin:    General: Skin is warm and dry.  Neurological:     Mental Status: He is alert and  oriented to person, place, and time.     Deep Tendon Reflexes: Reflexes are normal and symmetric.  Psychiatric:        Behavior: Behavior normal.        Assessment & Plan:  Adam Cunningham. is a 63 y.o. male . Annual physical exam  - -anticipatory guidance as below in AVS, screening labs above. Health maintenance items as above in HPI discussed/recommended as applicable.   Gout, unspecified cause, unspecified chronicity, unspecified site - Plan: colchicine 0.6 MG tablet, Uric acid Gouty arthritis - Plan: colchicine 0.6 MG tablet, Uric acid  -No recent flare, tolerating allopurinol, continue same.  Check uric acid level with colchicine prescribed if needed.  Mixed hyperlipidemia - Plan: Comprehensive metabolic panel, Lipid panel  -Check labs.  Continue Zetia.  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  Appears order was placed 04/30/22 with repeat scan in 1 year.  Not yet scheduled.  Recurrent low back pain  -Improved with epidural spinal injections, has muscle relaxant if needed.  Continue follow-up with specialist.  History of prostate cancer - Plan: PSA Screening for prostate cancer - Plan: PSA  -Check PSA for monitoring status post prostatectomy.  Screening for diabetes mellitus - Plan: Hemoglobin A1c  Need for pneumococcal vaccination - Plan: Pneumococcal conjugate vaccine 20-valent (Prevnar 20)   Meds ordered this encounter  Medications   colchicine 0.6 MG tablet    Sig: Take 1 tablet (0.6 mg total) by mouth daily as needed (gout flare ups).    Dispense:  30 tablet    Refill:  1   Patient Instructions  No med changes at this time. If any concerns on labs I will let you know.  Tell the family hello.  Take care!  Preventive Care 46-65 Years Old, Male Preventive care refers to lifestyle choices and visits with your health care provider that  can promote health and wellness. Preventive care visits are also called wellness exams. What can I expect for my preventive care visit? Counseling During your preventive care visit, your health care provider may ask about your: Medical history, including: Past medical problems. Family medical history. Current health, including: Emotional well-being. Home life and relationship well-being. Sexual activity. Lifestyle, including: Alcohol, nicotine or tobacco, and drug use. Access to firearms. Diet, exercise, and sleep habits. Safety issues such as seatbelt and bike helmet use. Sunscreen use. Work and work Astronomer. Physical exam Your health care provider will check your: Height and weight. These may be used to calculate your BMI (body mass index). BMI is a measurement that tells if you are at a healthy weight. Waist circumference. This measures the distance around your waistline. This measurement also tells if you are at a healthy weight and may help predict your risk of certain diseases, such as type 2 diabetes and high blood pressure. Heart rate and blood pressure. Body temperature. Skin for abnormal spots. What immunizations do I need?  Vaccines are usually given at various ages, according to a schedule. Your health care provider will recommend vaccines for you based on your age, medical history, and lifestyle or other factors, such as travel or where you work. What  tests do I need? Screening Your health care provider may recommend screening tests for certain conditions. This may include: Lipid and cholesterol levels. Diabetes screening. This is done by checking your blood sugar (glucose) after you have not eaten for a while (fasting). Hepatitis B test. Hepatitis C test. HIV (human immunodeficiency virus) test. STI (sexually transmitted infection) testing, if you are at risk. Lung cancer screening. Prostate cancer screening. Colorectal cancer screening. Talk with your health  care provider about your test results, treatment options, and if necessary, the need for more tests. Follow these instructions at home: Eating and drinking  Eat a diet that includes fresh fruits and vegetables, whole grains, lean protein, and low-fat dairy products. Take vitamin and mineral supplements as recommended by your health care provider. Do not drink alcohol if your health care provider tells you not to drink. If you drink alcohol: Limit how much you have to 0-2 drinks a day. Know how much alcohol is in your drink. In the [place] one drink equals one 12 oz bottle of beer (355 mL), one 5 oz glass of wine (148 mL), or one 1 oz glass of hard liquor (44 mL). Lifestyle Brush your teeth every morning and night with fluoride toothpaste. Floss one time each day. Exercise for at least 30 minutes 5 or more days each week. Do not use any products that contain nicotine or tobacco. These products include cigarettes, chewing tobacco, and vaping devices, such as e-cigarettes. If you need help quitting, ask your health care provider. Do not use drugs. If you are sexually active, practice safe sex. Use a condom or other form of protection to prevent STIs. Take aspirin only as told by your health care provider. Make sure that you understand how much to take and what form to take. Work with your health care provider to find out whether it is safe and beneficial for you to take aspirin daily. Find healthy ways to manage stress, such as: Meditation, yoga, or listening to music. Journaling. Talking to a trusted person. Spending time with friends and family. Minimize exposure to UV radiation to reduce your risk of skin cancer. Safety Always wear your seat belt while driving or riding in a vehicle. Do not drive: If you have been drinking alcohol. Do not ride with someone who has been drinking. When you are tired or distracted. While texting. If you have been using any mind-altering substances or  drugs. Wear a helmet and other protective equipment during sports activities. If you have firearms in your house, make sure you follow all gun safety procedures. What's next? Go to your health care provider once a year for an annual wellness visit. Ask your health care provider how often you should have your eyes and teeth checked. Stay up to date on all vaccines. This information is not intended to replace advice given to you by your health care provider. Make sure you discuss any questions you have with your health care provider. Document Revised: 07/05/2020 Document Reviewed: 07/05/2020 Elsevier Patient Education  2024 Elsevier Inc.    Signed,   Meredith Staggers, MD [facility], [facility] 02/13/23 7:14 PM

## 2023-02-13 NOTE — Telephone Encounter (Signed)
Patient seen for physical today, it appears a CT angiogram to evaluate his aorta was ordered in April 2024 for repeat testing this April.  Can we check to see if that has been scheduled and if not assist with scheduling.  Thank you

## 2023-02-13 NOTE — Patient Instructions (Signed)
No med changes at this time. If any concerns on labs I will let you know.  Tell the family hello.  Take care!  Preventive Care 31-64 Years Old, Male Preventive care refers to lifestyle choices and visits with your health care provider that can promote health and wellness. Preventive care visits are also called wellness exams. What can I expect for my preventive care visit? Counseling During your preventive care visit, your health care provider may ask about your: Medical history, including: Past medical problems. Family medical history. Current health, including: Emotional well-being. Home life and relationship well-being. Sexual activity. Lifestyle, including: Alcohol, nicotine or tobacco, and drug use. Access to firearms. Diet, exercise, and sleep habits. Safety issues such as seatbelt and bike helmet use. Sunscreen use. Work and work Astronomer. Physical exam Your health care provider will check your: Height and weight. These may be used to calculate your BMI (body mass index). BMI is a measurement that tells if you are at a healthy weight. Waist circumference. This measures the distance around your waistline. This measurement also tells if you are at a healthy weight and may help predict your risk of certain diseases, such as type 2 diabetes and high blood pressure. Heart rate and blood pressure. Body temperature. Skin for abnormal spots. What immunizations do I need?  Vaccines are usually given at various ages, according to a schedule. Your health care provider will recommend vaccines for you based on your age, medical history, and lifestyle or other factors, such as travel or where you work. What tests do I need? Screening Your health care provider may recommend screening tests for certain conditions. This may include: Lipid and cholesterol levels. Diabetes screening. This is done by checking your blood sugar (glucose) after you have not eaten for a while  (fasting). Hepatitis B test. Hepatitis C test. HIV (human immunodeficiency virus) test. STI (sexually transmitted infection) testing, if you are at risk. Lung cancer screening. Prostate cancer screening. Colorectal cancer screening. Talk with your health care provider about your test results, treatment options, and if necessary, the need for more tests. Follow these instructions at home: Eating and drinking  Eat a diet that includes fresh fruits and vegetables, whole grains, lean protein, and low-fat dairy products. Take vitamin and mineral supplements as recommended by your health care provider. Do not drink alcohol if your health care provider tells you not to drink. If you drink alcohol: Limit how much you have to 0-2 drinks a day. Know how much alcohol is in your drink. In the [place] one drink equals one 12 oz bottle of beer (355 mL), one 5 oz glass of wine (148 mL), or one 1 oz glass of hard liquor (44 mL). Lifestyle Brush your teeth every morning and night with fluoride toothpaste. Floss one time each day. Exercise for at least 30 minutes 5 or more days each week. Do not use any products that contain nicotine or tobacco. These products include cigarettes, chewing tobacco, and vaping devices, such as e-cigarettes. If you need help quitting, ask your health care provider. Do not use drugs. If you are sexually active, practice safe sex. Use a condom or other form of protection to prevent STIs. Take aspirin only as told by your health care provider. Make sure that you understand how much to take and what form to take. Work with your health care provider to find out whether it is safe and beneficial for you to take aspirin daily. Find healthy ways to manage stress, such as:  Meditation, yoga, or listening to music. Journaling. Talking to a trusted person. Spending time with friends and family. Minimize exposure to UV radiation to reduce your risk of skin cancer. Safety Always wear  your seat belt while driving or riding in a vehicle. Do not drive: If you have been drinking alcohol. Do not ride with someone who has been drinking. When you are tired or distracted. While texting. If you have been using any mind-altering substances or drugs. Wear a helmet and other protective equipment during sports activities. If you have firearms in your house, make sure you follow all gun safety procedures. What's next? Go to your health care provider once a year for an annual wellness visit. Ask your health care provider how often you should have your eyes and teeth checked. Stay up to date on all vaccines. This information is not intended to replace advice given to you by your health care provider. Make sure you discuss any questions you have with your health care provider. Document Revised: 07/05/2020 Document Reviewed: 07/05/2020 Elsevier Patient Education  2024 [employer].

## 2023-02-14 ENCOUNTER — Encounter: Payer: Self-pay | Admitting: Orthopedic Surgery

## 2023-02-14 LAB — COMPREHENSIVE METABOLIC PANEL
ALT: 27 U/L (ref 0–53)
AST: 22 U/L (ref 0–37)
Albumin: 4.6 g/dL (ref 3.5–5.2)
Alkaline Phosphatase: 53 U/L (ref 39–117)
BUN: 12 mg/dL (ref 6–23)
CO2: 26 meq/L (ref 19–32)
Calcium: 9.1 mg/dL (ref 8.4–10.5)
Chloride: 102 meq/L (ref 96–112)
Creatinine, Ser: 0.83 mg/dL (ref 0.40–1.50)
GFR: 93.14 mL/min (ref >60.00)
Glucose, Bld: 92 mg/dL (ref 70–99)
Potassium: 3.7 meq/L (ref 3.5–5.1)
Sodium: 138 meq/L (ref 135–145)
Total Bilirubin: 0.9 mg/dL (ref 0.2–1.2)
Total Protein: 7.5 g/dL (ref 6.0–8.3)

## 2023-02-14 LAB — LIPID PANEL
Cholesterol: 191 mg/dL (ref 0–200)
HDL: 41.9 mg/dL (ref >39.00)
LDL Cholesterol: 105 mg/dL — ABNORMAL HIGH (ref 0–99)
NonHDL: 148.85
Total CHOL/HDL Ratio: 5
Triglycerides: 220 mg/dL — ABNORMAL HIGH (ref 0.0–149.0)
VLDL: 44 mg/dL — ABNORMAL HIGH (ref 0.0–40.0)

## 2023-02-14 LAB — PSA: PSA: 0.01 ng/mL — ABNORMAL LOW (ref 0.10–4.00)

## 2023-02-14 LAB — URIC ACID: Uric Acid, Serum: 6.2 mg/dL (ref 4.0–7.8)

## 2023-02-14 LAB — HEMOGLOBIN A1C: Hgb A1c MFr Bld: 5.7 % (ref 4.6–6.5)

## 2023-02-14 MED ORDER — DICLOFENAC SODIUM 75 MG PO TBEC
75.0000 mg | DELAYED_RELEASE_TABLET | Freq: Two times a day (BID) | ORAL | 2 refills | Status: DC
  Filled 2023-02-14: qty 60, 30d supply, fill #0
  Filled 2023-04-05: qty 60, 30d supply, fill #1
  Filled 2023-09-09: qty 60, 30d supply, fill #2

## 2023-02-15 ENCOUNTER — Other Ambulatory Visit (HOSPITAL_COMMUNITY): Payer: Self-pay

## 2023-02-17 ENCOUNTER — Other Ambulatory Visit: Payer: Self-pay

## 2023-02-19 ENCOUNTER — Encounter: Payer: Self-pay | Admitting: Family Medicine

## 2023-02-24 NOTE — Addendum Note (Signed)
Addended by: Meredith Staggers R on: 02/24/2023 12:39 PM   Modules accepted: Level of Service

## 2023-04-05 ENCOUNTER — Other Ambulatory Visit (HOSPITAL_COMMUNITY): Payer: Self-pay

## 2023-04-10 NOTE — Telephone Encounter (Signed)
 Called Adam Cunningham per Dr. Paralee Cancel request. Left Adam Cunningham a message reminding him it is time to schedule the CT scan, it is due in April. Provided the centralized scheduling dept. Ph# [phone], opt. 3, for him to call and schedule at any [facility] facility he desires. Provided my number as well as directed him to contact ordering provider if any addition information is needed.

## 2023-04-23 ENCOUNTER — Encounter (HOSPITAL_BASED_OUTPATIENT_CLINIC_OR_DEPARTMENT_OTHER): Payer: Self-pay | Admitting: Internal Medicine

## 2023-04-23 ENCOUNTER — Telehealth (HOSPITAL_BASED_OUTPATIENT_CLINIC_OR_DEPARTMENT_OTHER): Payer: Self-pay | Admitting: Internal Medicine

## 2023-04-23 NOTE — Telephone Encounter (Signed)
 Patient called in response to letter received.  Central Scheduling was contacted and will call patient to get him scheduled.

## 2023-04-23 NOTE — Telephone Encounter (Signed)
 Will forward to Stan Head who sent letter

## 2023-04-29 ENCOUNTER — Ambulatory Visit (HOSPITAL_BASED_OUTPATIENT_CLINIC_OR_DEPARTMENT_OTHER)
Admission: RE | Admit: 2023-04-29 | Discharge: 2023-04-29 | Disposition: A | Discharge status: Home / Self Care | Source: Ambulatory Visit | Attending: Internal Medicine | Admitting: Internal Medicine

## 2023-04-29 DIAGNOSIS — I2510 Atherosclerotic heart disease of native coronary artery without angina pectoris: Secondary | ICD-10-CM | POA: Diagnosis not present

## 2023-04-29 DIAGNOSIS — I77810 Thoracic aortic ectasia: Secondary | ICD-10-CM | POA: Insufficient documentation

## 2023-04-29 DIAGNOSIS — I517 Cardiomegaly: Secondary | ICD-10-CM | POA: Diagnosis not present

## 2023-04-29 MED ORDER — IOHEXOL 350 MG/ML SOLN
100.0000 mL | Freq: Once | INTRAVENOUS | Status: AC | PRN
  Administered 2023-04-29: 100 mL via INTRAVENOUS

## 2023-05-12 ENCOUNTER — Other Ambulatory Visit (HOSPITAL_BASED_OUTPATIENT_CLINIC_OR_DEPARTMENT_OTHER): Payer: Self-pay

## 2023-05-12 DIAGNOSIS — I77810 Thoracic aortic ectasia: Secondary | ICD-10-CM

## 2023-05-20 DIAGNOSIS — H5203 Hypermetropia, bilateral: Secondary | ICD-10-CM | POA: Diagnosis not present

## 2023-05-23 ENCOUNTER — Other Ambulatory Visit (HOSPITAL_COMMUNITY): Payer: Self-pay

## 2023-08-15 ENCOUNTER — Other Ambulatory Visit (HOSPITAL_BASED_OUTPATIENT_CLINIC_OR_DEPARTMENT_OTHER): Payer: Self-pay | Admitting: Internal Medicine

## 2023-08-15 ENCOUNTER — Ambulatory Visit: Payer: 59 | Admitting: Family Medicine

## 2023-08-16 ENCOUNTER — Other Ambulatory Visit (HOSPITAL_COMMUNITY): Payer: Self-pay

## 2023-08-18 ENCOUNTER — Other Ambulatory Visit: Payer: Self-pay

## 2023-08-18 ENCOUNTER — Other Ambulatory Visit (HOSPITAL_COMMUNITY): Payer: Self-pay

## 2023-08-18 MED ORDER — EZETIMIBE 10 MG PO TABS
10.0000 mg | ORAL_TABLET | Freq: Every day | ORAL | 0 refills | Status: DC
  Filled 2023-08-18: qty 90, 90d supply, fill #0

## 2023-09-09 ENCOUNTER — Other Ambulatory Visit (HOSPITAL_COMMUNITY): Payer: Self-pay

## 2023-11-24 ENCOUNTER — Encounter: Payer: Self-pay | Admitting: Radiology

## 2023-12-02 ENCOUNTER — Other Ambulatory Visit (HOSPITAL_COMMUNITY): Payer: Self-pay

## 2023-12-02 ENCOUNTER — Other Ambulatory Visit: Payer: Self-pay

## 2023-12-02 ENCOUNTER — Other Ambulatory Visit (HOSPITAL_BASED_OUTPATIENT_CLINIC_OR_DEPARTMENT_OTHER): Payer: Self-pay | Admitting: Internal Medicine

## 2023-12-02 ENCOUNTER — Other Ambulatory Visit: Payer: Self-pay | Admitting: Orthopedic Surgery

## 2023-12-02 MED ORDER — DICLOFENAC SODIUM 75 MG PO TBEC
75.0000 mg | DELAYED_RELEASE_TABLET | Freq: Two times a day (BID) | ORAL | 2 refills | Status: AC
  Filled 2023-12-02: qty 60, 30d supply, fill #0

## 2023-12-04 ENCOUNTER — Other Ambulatory Visit (HOSPITAL_COMMUNITY): Payer: Self-pay

## 2023-12-04 ENCOUNTER — Other Ambulatory Visit: Payer: Self-pay

## 2023-12-04 MED ORDER — EZETIMIBE 10 MG PO TABS
10.0000 mg | ORAL_TABLET | Freq: Every day | ORAL | 0 refills | Status: AC
  Filled 2023-12-04: qty 90, 90d supply, fill #0

## 2024-01-06 ENCOUNTER — Ambulatory Visit (INDEPENDENT_AMBULATORY_CARE_PROVIDER_SITE_OTHER)

## 2024-01-06 DIAGNOSIS — Z23 Encounter for immunization: Secondary | ICD-10-CM

## 2024-01-06 NOTE — Progress Notes (Signed)
 Patient is in office today for a nurse visit for Influenza Immunization. Patient Injection was given in the  Left deltoid. Patient tolerated injection well.

## 2024-02-08 ENCOUNTER — Other Ambulatory Visit: Payer: Self-pay | Admitting: Family Medicine

## 2024-02-08 DIAGNOSIS — N529 Male erectile dysfunction, unspecified: Secondary | ICD-10-CM

## 2024-02-10 ENCOUNTER — Other Ambulatory Visit (HOSPITAL_COMMUNITY): Payer: Self-pay

## 2024-02-10 ENCOUNTER — Other Ambulatory Visit: Payer: Self-pay | Admitting: Family Medicine

## 2024-02-10 ENCOUNTER — Other Ambulatory Visit: Payer: Self-pay

## 2024-02-10 DIAGNOSIS — N529 Male erectile dysfunction, unspecified: Secondary | ICD-10-CM

## 2024-02-10 MED ORDER — TADALAFIL 20 MG PO TABS
10.0000 mg | ORAL_TABLET | ORAL | 0 refills | Status: AC | PRN
  Filled 2024-02-10: qty 5, 25d supply, fill #0

## 2024-02-18 ENCOUNTER — Encounter: Payer: Self-pay | Admitting: Family Medicine

## 2024-02-18 NOTE — Telephone Encounter (Signed)
 Patient is wondering if you would accept two of his friends as new patients. I know you are not accepting new patients right now. Just wanted to pass along the message

## 2024-02-24 NOTE — Telephone Encounter (Signed)
 Message will be entered on patient's chart, and routed to clinical message pool.  Thanks

## 2024-02-24 NOTE — Telephone Encounter (Signed)
 Dr Levora your next New patient availability is 04/21/2024 but it seems patient might need a sooner appointment due to recent hospital visit, is there a day or time you would feel comfortable putting in a new patient/hospital follow up?

## 2024-03-11 ENCOUNTER — Encounter: Admitting: Family Medicine
